# Patient Record
Sex: Female | Born: 1950
Health system: Southern US, Community
[De-identification: ages and names within clinical notes are randomized; demographics above are authoritative.]

## PROBLEM LIST (undated history)

## (undated) DIAGNOSIS — T7840XA Allergy, unspecified, initial encounter: Secondary | ICD-10-CM

## (undated) DIAGNOSIS — F329 Major depressive disorder, single episode, unspecified: Secondary | ICD-10-CM

## (undated) DIAGNOSIS — F1020 Alcohol dependence, uncomplicated: Secondary | ICD-10-CM

## (undated) DIAGNOSIS — E039 Hypothyroidism, unspecified: Secondary | ICD-10-CM

## (undated) DIAGNOSIS — F32A Depression, unspecified: Secondary | ICD-10-CM

## (undated) DIAGNOSIS — I10 Essential (primary) hypertension: Secondary | ICD-10-CM

## (undated) DIAGNOSIS — E785 Hyperlipidemia, unspecified: Secondary | ICD-10-CM

## (undated) HISTORY — PX: MYOMECTOMY: SHX85

## (undated) HISTORY — PX: OOPHORECTOMY: SHX86

## (undated) HISTORY — PX: POLYPECTOMY: SHX149

## (undated) HISTORY — DX: Allergy, unspecified, initial encounter: T78.40XA

## (undated) HISTORY — PX: COLONOSCOPY: SHX174

## (undated) HISTORY — PX: OTHER SURGICAL HISTORY: SHX169

## (undated) HISTORY — PX: MASTECTOMY: SHX3

---

## 1997-09-22 ENCOUNTER — Emergency Department (HOSPITAL_COMMUNITY): Admission: EM | Admit: 1997-09-22 | Discharge: 1997-09-23 | Payer: Self-pay | Admitting: Emergency Medicine

## 1999-05-01 ENCOUNTER — Encounter: Payer: Self-pay | Admitting: Endocrinology

## 1999-05-01 ENCOUNTER — Ambulatory Visit (HOSPITAL_COMMUNITY): Admission: RE | Admit: 1999-05-01 | Discharge: 1999-05-01 | Payer: Self-pay | Admitting: Endocrinology

## 1999-05-04 ENCOUNTER — Encounter: Payer: Self-pay | Admitting: Endocrinology

## 1999-05-04 ENCOUNTER — Ambulatory Visit (HOSPITAL_COMMUNITY): Admission: RE | Admit: 1999-05-04 | Discharge: 1999-05-04 | Payer: Self-pay | Admitting: Endocrinology

## 1999-05-12 ENCOUNTER — Encounter (INDEPENDENT_AMBULATORY_CARE_PROVIDER_SITE_OTHER): Payer: Self-pay

## 1999-05-12 ENCOUNTER — Other Ambulatory Visit: Admission: RE | Admit: 1999-05-12 | Discharge: 1999-05-12 | Payer: Self-pay | Admitting: Obstetrics & Gynecology

## 2001-07-09 ENCOUNTER — Encounter: Payer: Self-pay | Admitting: Emergency Medicine

## 2001-07-09 ENCOUNTER — Emergency Department (HOSPITAL_COMMUNITY): Admission: EM | Admit: 2001-07-09 | Discharge: 2001-07-09 | Payer: Self-pay

## 2002-07-21 ENCOUNTER — Other Ambulatory Visit: Admission: RE | Admit: 2002-07-21 | Discharge: 2002-07-21 | Payer: Self-pay | Admitting: Obstetrics & Gynecology

## 2004-05-01 ENCOUNTER — Other Ambulatory Visit: Admission: RE | Admit: 2004-05-01 | Discharge: 2004-05-01 | Payer: Self-pay | Admitting: Obstetrics & Gynecology

## 2005-01-15 ENCOUNTER — Ambulatory Visit: Payer: Self-pay | Admitting: Internal Medicine

## 2006-01-01 HISTORY — PX: BREAST EXCISIONAL BIOPSY: SUR124

## 2006-10-17 ENCOUNTER — Encounter: Admission: RE | Admit: 2006-10-17 | Discharge: 2006-10-17 | Payer: Self-pay | Admitting: Obstetrics & Gynecology

## 2006-11-29 ENCOUNTER — Encounter (INDEPENDENT_AMBULATORY_CARE_PROVIDER_SITE_OTHER): Payer: Self-pay | Admitting: General Surgery

## 2006-11-29 ENCOUNTER — Ambulatory Visit (HOSPITAL_COMMUNITY): Admission: RE | Admit: 2006-11-29 | Discharge: 2006-11-29 | Payer: Self-pay | Admitting: General Surgery

## 2009-06-28 ENCOUNTER — Encounter (INDEPENDENT_AMBULATORY_CARE_PROVIDER_SITE_OTHER): Payer: Self-pay | Admitting: *Deleted

## 2009-07-11 ENCOUNTER — Encounter (INDEPENDENT_AMBULATORY_CARE_PROVIDER_SITE_OTHER): Payer: Self-pay | Admitting: *Deleted

## 2009-07-21 ENCOUNTER — Ambulatory Visit: Payer: Self-pay | Admitting: Internal Medicine

## 2009-07-28 ENCOUNTER — Ambulatory Visit: Payer: Self-pay | Admitting: Internal Medicine

## 2009-07-29 ENCOUNTER — Encounter: Payer: Self-pay | Admitting: Internal Medicine

## 2009-10-07 ENCOUNTER — Encounter: Admission: RE | Admit: 2009-10-07 | Discharge: 2009-10-07 | Payer: Self-pay | Admitting: Obstetrics & Gynecology

## 2010-01-21 ENCOUNTER — Encounter: Payer: Self-pay | Admitting: Obstetrics & Gynecology

## 2010-01-31 NOTE — Procedures (Signed)
Summary: Colonoscopy  Patient: [name] Note: All result statuses are Final unless otherwise noted.  Tests: (1) Colonoscopy (COL)   COL Colonoscopy           DONE     [facility]     [address]     [place], [place]  [address]           COLONOSCOPY PROCEDURE REPORT           PATIENT:  Melissa Wolfe, Melissa Wolfe  MR#:  [id number]     BIRTHDATE:  1950-10-11, 59 yrs. old  GENDER:  female     ENDOSCOPIST:  Wilhemina Bonito. Eda Keys, MD     REF. BY:  Creola Corn, M.D.     PROCEDURE DATE:  07/28/2009     PROCEDURE:  Colonoscopy with snare polypectomy x 2     ASA CLASS:  Class II     INDICATIONS:  Routine Risk Screening     MEDICATIONS:   Fentanyl 100 mcg IV, Versed 9 mg IV           DESCRIPTION OF PROCEDURE:   After the risks benefits and     alternatives of the procedure were thoroughly explained, informed     consent was obtained.  Digital rectal exam was performed and     revealed no abnormalities.   The LB CF-H180AL [id number] endoscope     was introduced through the anus and advanced to the cecum, which     was identified by both the appendix and ileocecal valve, without     limitations.Time to cecum = 5:23 min.The quality of the prep was     excellent, using MoviPrep.  The instrument was then slowly     withdrawn (time = 14:11 min) as the colon was fully examined.     <<PROCEDUREIMAGES>>           FINDINGS:  Two 2mm polyps were found in the ascending colon.     Polyps were snared without cautery. Retrieval was successful in     the nonadenomatous appearring polyp. The adenomatous appearring     polyp was suctioned but not retrieved.  This was otherwise a     normal examination of the colon.   Retroflexed views in the rectum     revealed no abnormalities.    The scope was then withdrawn from     the patient and the procedure completed.           COMPLICATIONS:  None     ENDOSCOPIC IMPRESSION:     1) Two tiny polyps in the ascending colon -removed     2) Otherwise normal  examination           RECOMMENDATIONS:     1) Follow up colonoscopy in 5 years           ______________________________     Wilhemina Bonito. Eda Keys, MD           CC:  Creola Corn, MD; The Patient           n.     eSIGNED:   Wilhemina Bonito. Eda Keys at 07/28/2009 09:17 AM           Clide [place], [id number]  Note: An exclamation mark (!) indicates a result that was not dispersed into the flowsheet. Document Creation Date: 07/28/2009 9:19 AM _______________________________________________________________________  (1) Order result status: Final Collection or observation date-time: 07/28/2009 09:10 Requested date-time:  Receipt date-time:  Reported date-time:  Referring  Physician:   Ordering Physician: Fransico Setters [phone]) Specimen Source:  Source: [name] Order Number: [id number] Lab site:   Appended Document: Colonoscopy     Procedures Next Due Date:    Colonoscopy: 07/2014

## 2010-01-31 NOTE — Letter (Signed)
Summary: [facility] Instructions  [facility]  [address] [place], [place] [address]   Phone: [phone]  Fax: [phone]       Melissa Wolfe    07/02/1950    MRN: [id number]        Procedure Day /Date:  Thursday 07/28/2009     Arrival Time: 8:00 am      Procedure Time: 9:00 am     Location of Procedure:                    _ x_  [facility] (4th Floor)                        PREPARATION FOR COLONOSCOPY WITH MOVIPREP   Starting 5 days prior to your procedure Saturday 7/23 do not eat nuts, seeds, popcorn, corn, beans, peas,  salads, or any raw vegetables.  Do not take any fiber supplements (e.g. Metamucil, Citrucel, and Benefiber).  THE DAY BEFORE YOUR PROCEDURE         DATE: Wednesday 7/27  1.  Drink clear liquids the entire day-NO SOLID FOOD  2.  Do not drink anything colored red or purple.  Avoid juices with pulp.  No orange juice.  3.  Drink at least 64 oz. (8 glasses) of fluid/clear liquids during the day to prevent dehydration and help the prep work efficiently.  CLEAR LIQUIDS INCLUDE: Water Jello Ice Popsicles Tea (sugar ok, no milk/cream) Powdered fruit flavored drinks Coffee (sugar ok, no milk/cream) Gatorade Juice: apple, white grape, white cranberry  Lemonade Clear bullion, consomm, broth Carbonated beverages (any kind) Strained chicken noodle soup Hard Candy                             4.  In the morning, mix first dose of MoviPrep solution:    Empty 1 Pouch A and 1 Pouch B into the disposable container    Add lukewarm drinking water to the top line of the container. Mix to dissolve    Refrigerate (mixed solution should be used within 24 hrs)  5.  Begin drinking the prep at 5:00 p.m. The MoviPrep container is divided by 4 marks.   Every 15 minutes drink the solution down to the next mark (approximately 8 oz) until the full liter is complete.   6.  Follow completed prep with 16 oz of clear liquid of your choice  (Nothing red or purple).  Continue to drink clear liquids until bedtime.  7.  Before going to bed, mix second dose of MoviPrep solution:    Empty 1 Pouch A and 1 Pouch B into the disposable container    Add lukewarm drinking water to the top line of the container. Mix to dissolve    Refrigerate  THE DAY OF YOUR PROCEDURE      DATE: Thursday 7/28  Beginning at 4:00 a.m. (5 hours before procedure):         1. Every 15 minutes, drink the solution down to the next mark (approx 8 oz) until the full liter is complete.  2. Follow completed prep with 16 oz. of clear liquid of your choice.    3. You may drink clear liquids until 7:00 am (2 HOURS BEFORE PROCEDURE).   MEDICATION INSTRUCTIONS  Unless otherwise instructed, you should take regular prescription medications with a small sip of water   as early as possible the morning of  your procedure.         OTHER INSTRUCTIONS  You will need a responsible adult at least 62 years of age to accompany you and drive you home.   This person must remain in the waiting room during your procedure.  Wear loose fitting clothing that is easily removed.  Leave jewelry and other valuables at home.  However, you may wish to bring a book to read or  an iPod/MP3 player to listen to music as you wait for your procedure to start.  Remove all body piercing jewelry and leave at home.  Total time from sign-in until discharge is approximately 2-3 hours.  You should go home directly after your procedure and rest.  You can resume normal activities the  day after your procedure.  The day of your procedure you should not:   Drive   Make legal decisions   Operate machinery   Drink alcohol   Return to work  You will receive specific instructions about eating, activities and medications before you leave.    The above instructions have been reviewed and explained to me by   Karl Bales RN  July 21, 2009 4:24 PM    I fully understand and  can verbalize these instructions _____________________________ Date _________

## 2010-01-31 NOTE — Letter (Signed)
Summary: Patient Notice- Polyp Results  [facility]  [address] [place], [place] [address]   Phone: [phone]  Fax: [phone]        July 29, 2009 MRN: [id number]    Melissa Wolfe [address] [place], [place]  [address]    Dear [name],  I am pleased to inform you that the colon polyp(s) removed during your recent colonoscopy was (were) found to be benign (no cancer detected) upon pathologic examination.  I recommend you have a repeat colonoscopy examination in 5 years to look for recurrent polyps, as having colon polyps increases your risk for having recurrent polyps or even colon cancer in the future.  Should you develop new or worsening symptoms of abdominal pain, bowel habit changes or bleeding from the rectum or bowels, please schedule an evaluation with either your primary care physician or with me.  Additional information/recommendations:  __ No further action with gastroenterology is needed at this time. Please      follow-up with your primary care physician for your other healthcare      needs.   Please call us if you are having persistent problems or have questions about your condition that have not been fully answered at this time.  Sincerely,  Hilarie Fredrickson MD  This letter has been electronically signed by your physician.  Appended Document: Patient Notice- Polyp Results Letter mailed 8.1.2011

## 2010-01-31 NOTE — Miscellaneous (Signed)
Summary: [facility] previsit  Clinical Lists Changes  Medications: Added new medication of MOVIPREP 100 GM  SOLR (PEG-KCL-NACL-NASULF-NA ASC-C) As per prep instructions. - Signed Rx of MOVIPREP 100 GM  SOLR (PEG-KCL-NACL-NASULF-NA ASC-C) As per prep instructions.;  #1 x 0;  Signed;  Entered by: Karl Bales RN;  Authorized by: Hilarie Fredrickson MD;  Method used: Electronically to [facility]  [address] [phone]*, [address], [place], [place], [place]  [address], Ph: [phone] or [phone], Fax: [phone] Allergies: Added new allergy or adverse reaction of CODEINE Added new allergy or adverse reaction of KEFLEX Observations: Added new observation of NKA: F (07/21/2009 15:58)    Prescriptions: MOVIPREP 100 GM  SOLR (PEG-KCL-NACL-NASULF-NA ASC-C) As per prep instructions.  #1 x 0   Entered by:   Karl Bales RN   Authorized by:   Hilarie Fredrickson MD   Signed by:   Karl Bales RN on 07/21/2009   Method used:   Electronically to        [facility]  [address] [phone]* (retail)       [address]       [place], [place]  [address]       Ph: [phone] or [phone]       Fax: [phone]   RxID:   [id number]

## 2010-01-31 NOTE — Letter (Signed)
Summary: Previsit letter  [facility]  [address] [place], [place] [address]   Phone: [phone]  Fax: [phone]       06/28/2009 MRN: [id number]  [facility] [address] [place], [place]  [address]  Dear Melissa Wolfe,  Welcome to the Gastroenterology Division at [facility].    You are scheduled to see a nurse for your pre-procedure visit on 07-14-09 at 8:30A.M. on the 3rd floor at [facility], [address]  We ask that you try to arrive at our office 15 minutes prior to your appointment time to allow for check-in.  Your nurse visit will consist of discussing your medical and surgical history, your immediate family medical history, and your medications.    Please bring a complete list of all your medications or, if you prefer, bring the medication bottles and we will list them.  We will need to be aware of both prescribed and over the counter drugs.  We will need to know exact dosage information as well.  If you are on blood thinners (Coumadin, Plavix, Aggrenox, Ticlid, etc.) please call our office today/prior to your appointment, as we need to consult with your physician about holding your medication.   Please be prepared to read and sign documents such as consent forms, a financial agreement, and acknowledgement forms.  If necessary, and with your consent, a friend or relative is welcome to sit-in on the nurse visit with you.  Please bring your insurance card so that we may make a copy of it.  If your insurance requires a referral to see a specialist, please bring your referral form from your primary care physician.  No co-pay is required for this nurse visit.     If you cannot keep your appointment, please call [phone] to cancel or reschedule prior to your appointment date.  This allows [name] the opportunity to schedule an appointment for another patient in need of care.    Thank you for choosing [facility] for  your medical needs.  We appreciate the opportunity to care for you.  Please visit [name] at our website  to learn more about our practice.                     Sincerely.                                                                                                                   The Gastroenterology Division

## 2010-05-16 NOTE — Op Note (Signed)
NAME:  Melissa Wolfe, Melissa Wolfe           ACCOUNT NO.:  [id number]   MEDICAL RECORD NO.:  [id number]          PATIENT TYPE:  AMB   LOCATION:  DAY                          FACILITY:  [facility]   PHYSICIAN:  Lennie Muckle, MD      DATE OF BIRTH:  [birth year]   DATE OF PROCEDURE:  11/29/2006  DATE OF DISCHARGE:                               OPERATIVE REPORT   PREOPERATIVE DIAGNOSIS:  Left breast cyst.   POSTOPERATIVE DIAGNOSIS:  Left breast cyst.   PROCEDURE:  Excision of left breast cyst.   ANESTHESIA:  General endotracheal anesthesia.   SURGEON:  Bertram Savin, M.D.   FINDINGS:  A large approximately 10 cm cyst which extended deep to the  pectoralis muscle.   SPECIMENS:  Cyst as well as cystic fluid was sent to microbiology and  pathology.  The specimen was marked for pathology   ESTIMATED BLOOD LOSS:  Minimal.   INDICATIONS FOR PROCEDURE:  Melissa Wolfe is a 60 year old female who has  had multiple aspirations of a cyst in her left breast that was atypia on  the cystic fluid.  Due to the multiple recurrences it was decided the  best procedure would be excision of this cystic tissue.  Informed  consent was obtained prior to the procedure.   PROCEDURE IN DETAIL:  Melissa Wolfe was identified in the preoperative  holding suite.  She was given IV antibiotics prior to being taken to the  operating room. Once in the operating room she was placed in the supine  position.  After administration of general endotracheal anesthesia,  her  left breast and chest wall were prepped and draped in the usual sterile  fashion.  Using 0.25% Marcaine the skin was anesthetized and a #15 blade  was used for incision.  This was placed directly over the cystic lesion  which was approximately 8 o'clock in position.  The subcutaneous tissue  was divided with electrocautery.  The cyst was easily identified and  palpable.  The surrounding tissues were dissected away with the  electrocautery.  The cyst was deep down  into the tissues to the  pectoralis muscle, and in an attempt to fully excise the cyst did enter  the cystic cavity and fluid was released.  This was sent for  microbiology and pathology.  I continued to perform the excision of this  cystic capsule.  Marked the lateral border with a long silk suture and  the deep tissue was marked with a double short suture.  The specimen was  then removed from the operative field.  The cavity was irrigated.  Bleeding was controlled with electrocautery.  The dermal layer  was closed with 3-0 Vicryl.  The skin was closed with 4-0 Monocryl.  The  cavity was then filled with 0.25% Marcaine with epinephrine as well as  further anesthetizing the skin.  Two Steri-Strips were placed, then  gauze and an ACE wrap around the chest wall. The patient was then  extubated and transported to the postanesthesia care unit.      Lennie Muckle, MD  Electronically Signed     ALA/MEDQ  D:  11/29/2006  T:  11/29/2006  Job:  [id number]   cc:   Gwen Pounds, MD  Fax: [phone]   Drema Halon  [facility]  [facility]

## 2010-10-10 LAB — BASIC METABOLIC PANEL
BUN: 9
Chloride: 105
GFR calc non Af Amer: >60
Glucose, Bld: 98
Potassium: 3.6

## 2010-10-10 LAB — CULTURE, ROUTINE-ABSCESS: Gram Stain: NONE SEEN

## 2010-10-10 LAB — ANAEROBIC CULTURE

## 2010-10-10 LAB — HEMOGLOBIN AND HEMATOCRIT, BLOOD: Hemoglobin: 14

## 2014-03-01 ENCOUNTER — Emergency Department (HOSPITAL_BASED_OUTPATIENT_CLINIC_OR_DEPARTMENT_OTHER)
Admission: EM | Admit: 2014-03-01 | Discharge: 2014-03-01 | Disposition: A | Discharge status: Home / Self Care | Payer: Medicare Other | Attending: Emergency Medicine | Admitting: Emergency Medicine

## 2014-03-01 ENCOUNTER — Emergency Department (HOSPITAL_BASED_OUTPATIENT_CLINIC_OR_DEPARTMENT_OTHER): Payer: Medicare Other

## 2014-03-01 ENCOUNTER — Encounter (HOSPITAL_BASED_OUTPATIENT_CLINIC_OR_DEPARTMENT_OTHER): Payer: Self-pay | Admitting: Emergency Medicine

## 2014-03-01 DIAGNOSIS — E785 Hyperlipidemia, unspecified: Secondary | ICD-10-CM | POA: Insufficient documentation

## 2014-03-01 DIAGNOSIS — R51 Headache: Secondary | ICD-10-CM | POA: Diagnosis not present

## 2014-03-01 DIAGNOSIS — Z8701 Personal history of pneumonia (recurrent): Secondary | ICD-10-CM | POA: Insufficient documentation

## 2014-03-01 DIAGNOSIS — R05 Cough: Secondary | ICD-10-CM

## 2014-03-01 DIAGNOSIS — Z87891 Personal history of nicotine dependence: Secondary | ICD-10-CM | POA: Diagnosis not present

## 2014-03-01 DIAGNOSIS — I10 Essential (primary) hypertension: Secondary | ICD-10-CM | POA: Insufficient documentation

## 2014-03-01 DIAGNOSIS — Z79899 Other long term (current) drug therapy: Secondary | ICD-10-CM | POA: Diagnosis not present

## 2014-03-01 DIAGNOSIS — E039 Hypothyroidism, unspecified: Secondary | ICD-10-CM | POA: Diagnosis not present

## 2014-03-01 DIAGNOSIS — J069 Acute upper respiratory infection, unspecified: Secondary | ICD-10-CM

## 2014-03-01 DIAGNOSIS — F329 Major depressive disorder, single episode, unspecified: Secondary | ICD-10-CM | POA: Insufficient documentation

## 2014-03-01 DIAGNOSIS — R059 Cough, unspecified: Secondary | ICD-10-CM

## 2014-03-01 HISTORY — DX: Essential (primary) hypertension: I10

## 2014-03-01 HISTORY — DX: Hyperlipidemia, unspecified: E78.5

## 2014-03-01 HISTORY — DX: Hypothyroidism, unspecified: E03.9

## 2014-03-01 HISTORY — DX: Depression, unspecified: F32.A

## 2014-03-01 HISTORY — DX: Major depressive disorder, single episode, unspecified: F32.9

## 2014-03-01 MED ORDER — PREDNISONE 50 MG PO TABS
60.0000 mg | ORAL_TABLET | Freq: Once | ORAL | Status: AC
  Administered 2014-03-01: 60 mg via ORAL
  Filled 2014-03-01 (×2): qty 1

## 2014-03-01 MED ORDER — IPRATROPIUM-ALBUTEROL 0.5-2.5 (3) MG/3ML IN SOLN
3.0000 mL | RESPIRATORY_TRACT | Status: AC
  Administered 2014-03-01: 3 mL via RESPIRATORY_TRACT
  Filled 2014-03-01: qty 3

## 2014-03-01 MED ORDER — ALBUTEROL SULFATE HFA 108 (90 BASE) MCG/ACT IN AERS: 1.0000 | INHALATION_SPRAY | Freq: Four times a day (QID) | RESPIRATORY_TRACT | Status: DC | PRN

## 2014-03-01 MED ORDER — PREDNISONE 20 MG PO TABS: ORAL_TABLET | ORAL | Status: DC

## 2014-03-01 NOTE — Discharge Instructions (Signed)
Cough, Adult   A cough is a reflex. It helps you clear your throat and airways. A cough can help heal your body. A cough can last 2 or 3 weeks (acute) or may last more than 8 weeks (chronic). Some common causes of a cough can include an infection, allergy, or a cold.  HOME CARE  · Only take medicine as told by your doctor.  · If given, take your medicines (antibiotics) as told. Finish them even if you start to feel better.  · Use a cold steam vaporizer or humidifier in your home. This can help loosen thick spit (secretions).  · Sleep so you are almost sitting up (semi-upright). Use pillows to do this. This helps reduce coughing.  · Rest as needed.  · Stop smoking if you smoke.  GET HELP RIGHT AWAY IF:  · You have yellowish-white fluid (pus) in your thick spit.  · Your cough gets worse.  · Your medicine does not reduce coughing, and you are losing sleep.  · You cough up blood.  · You have trouble breathing.  · Your pain gets worse and medicine does not help.  · You have a fever.  MAKE SURE YOU:   · Understand these instructions.  · Will watch your condition.  · Will get help right away if you are not doing well or get worse.  Document Released: 08/31/2010 Document Revised: 05/04/2013 Document Reviewed: 08/31/2010  [employer]® Patient Information ©2015 [employer]. This information is not intended to replace advice given to you by your health care provider. Make sure you discuss any questions you have with your health care provider.

## 2014-03-01 NOTE — ED Notes (Signed)
Pt having productive cough x 3 weeks with post nasal drip.  Pt having white sputum.  Pt now having some sob with exertion.  Pt unable to complete sentence without taking breath.

## 2014-03-01 NOTE — ED Provider Notes (Signed)
CSN: [id number]     Arrival date & time 03/01/14  1628 History  This chart was scribed for Pamella Pert, MD by Edison Simon, ED Scribe. This patient was seen in room MH06/MH06 and the patient's care was started at 5:05 PM.    Chief Complaint  Patient presents with  . Cough   Patient is a 64 y.o. female presenting with cough. The history is provided by the patient. No language interpreter was used.  Cough Cough characteristics:  Productive Sputum characteristics:  Clear and white Severity:  Moderate Onset quality:  Gradual Duration:  3 weeks Timing:  Constant Progression:  Worsening Chronicity:  New Smoker: no (former smoker)   Context: upper respiratory infection   Relieved by: flonase. Worsened by:  Nothing tried Ineffective treatments:  None tried Associated symptoms: headaches, shortness of breath and wheezing   Associated symptoms: no chest pain, no eye discharge and no rash   Shortness of breath:    Duration:  2 days Risk factors: recent infection     HPI Comments: Melissa Wolfe is a 64 y.o. female who presents to the Emergency Department complaining of cough with onset 3 weeks ago. She reports associated postnasal drip, congestion, headache, and wheezing. She states she began feeling SOB yesterday and began wheezing today. She notes she slept all day yesterday. She states she had wheezing years ago with pneumonia. She states cough produces white/clear phlegm. She reports chest discomfort with coughing but denies pain. She states Flonase improves her symptoms. She stopped smoking 4 years ago but had smoked since she was 57 previously. She reports history of HTN, HLD, and hypothyroid. She denies vomiting, diarrhea, or chest pain.  Past Medical History  Diagnosis Date  . Hypothyroid   . Hypertension   . Hyperlipemia   . Depression    History reviewed. No pertinent past surgical history. No family history on file. History  Substance Use Topics  . Smoking status:  Former Smoker    Quit date: 02/28/2010  . Smokeless tobacco: Not on file  . Alcohol Use: Not on file   OB History    No data available     Review of Systems  Constitutional: Negative for appetite change and fatigue.  HENT: Positive for congestion and postnasal drip. Negative for ear discharge and sinus pressure.   Eyes: Negative for discharge.  Respiratory: Positive for cough, shortness of breath and wheezing.   Cardiovascular: Negative for chest pain.  Gastrointestinal: Negative for vomiting, abdominal pain and diarrhea.  Genitourinary: Negative for frequency and hematuria.  Musculoskeletal: Negative for back pain.  Skin: Negative for rash.  Neurological: Positive for headaches. Negative for seizures.  Psychiatric/Behavioral: Negative for hallucinations.  All other systems reviewed and are negative.     Allergies  Codeine and Cephalexin  Home Medications   Prior to Admission medications   Medication Sig Start Date End Date Taking? Authorizing Provider  atorvastatin (LIPITOR) 20 MG tablet Take 20 mg by mouth daily.   Yes Historical Provider, MD  levothyroxine (SYNTHROID, LEVOTHROID) 137 MCG tablet Take 137 mcg by mouth daily before breakfast.   Yes Historical Provider, MD  UNKNOWN TO PATIENT BP meds   Yes Historical Provider, MD  venlafaxine XR (EFFEXOR-XR) 37.5 MG 24 hr capsule Take 37.5 mg by mouth daily with breakfast.   Yes Historical Provider, MD   BP 149/71 mmHg  Pulse 80  Temp(Src) 98.1 F (36.7 C) (Oral)  Resp 18  Ht 5' (1.524 m)  Wt 179 lb 4 oz (81.307 kg)  BMI 35.01 kg/m2  SpO2 99% Physical Exam  Constitutional: She appears well-developed and well-nourished. No distress.  Dry hacking cough on exam  HENT:  Head: Normocephalic and atraumatic.  Mouth/Throat: Oropharynx is clear and moist. No oropharyngeal exudate.  Clear TMs bilaterally  Eyes: Conjunctivae and EOM are normal. Pupils are equal, round, and reactive to light. Right eye exhibits no discharge.  Left eye exhibits no discharge. No scleral icterus.  Neck: Normal range of motion. Neck supple. No JVD present. No thyromegaly present.  Cardiovascular: Normal rate, regular rhythm, normal heart sounds and intact distal pulses.  Exam reveals no gallop and no friction rub.   No murmur heard. Pulmonary/Chest: Effort normal and breath sounds normal. No respiratory distress. She has no wheezes. She has no rales.  Abdominal: Soft. Bowel sounds are normal. She exhibits no distension and no mass. There is no tenderness.  Musculoskeletal: Normal range of motion. She exhibits no edema or tenderness.  Symmetric lower extremities without focal tenderness  Lymphadenopathy:    She has no cervical adenopathy.  Neurological: She is alert. No cranial nerve deficit. Coordination normal.  Skin: Skin is warm and dry. No rash noted. No erythema.  Psychiatric: She has a normal mood and affect. Her behavior is normal.  Nursing note and vitals reviewed.   ED Course  Procedures (including critical care time)  DIAGNOSTIC STUDIES: Oxygen Saturation is 99% on room air, normal by my interpretation.    COORDINATION OF CARE: 5:14 PM Discussed with patient that x-ray does not reveal evidence of pneumonia. Discussed treatment plan with patient at beside, including breathing treatment, EKG, nasal steroid, and antihistamine. The patient agrees with the plan and has no further questions at this time.   Labs Review Labs Reviewed - No data to display  Imaging Review Dg Chest 2 View  03/01/2014   CLINICAL DATA:  Cough for 3 weeks with worsening today  EXAM: CHEST  2 VIEW  COMPARISON:  11/27/2006  FINDINGS: Cardiomediastinal silhouette is unremarkable. No acute infiltrate or pulmonary edema. Mild degenerative changes mid thoracic spine.  IMPRESSION: No active cardiopulmonary disease.   Electronically Signed   By: Lahoma Crocker M.D.   On: 03/01/2014 17:05     EKG Interpretation   Date/Time:  Monday March 01 2014  17:40:29 EST Ventricular Rate:  69 PR Interval:  142 QRS Duration: 84 QT Interval:  414 QTC Calculation: 443 R Axis:     Text Interpretation:  Normal sinus rhythm Nonspecific T wave abnormality  No significant change since last tracing Confirmed by Danali Marinos  MD,  Lailah Marcelli ([id number]) on 03/01/2014 5:46:42 PM      MDM   Final diagnoses:  Cough  URI (upper respiratory infection)    6:31 PM 64 y.o. female  Here with a cough productive of white sputum , rhinorrhea for the last 3 weeks. She notes some mild shortness of breath and wheezing over the last 2 days. No significant wheezing on my exam. I did give her a breathing treatment which she states helped significantly.  Screening EKG is noncontributory. Vital signs unremarkable here. Chest wall pain only w/ coughing. Do not think this is ACS or PE. Likely related to viral URI. Given significant treatment with a breathing treatment will send her home on prednisone and albuterol. She does have a smoking history although she is not a current smoker.  6:32 PM:  I have discussed the diagnosis/risks/treatment options with the patient and believe the pt to be eligible for discharge home to follow-up with  her pcp in 2 days if no better. We also discussed returning to the ED immediately if new or worsening sx occur. We discussed the sx which are most concerning (e.g., cp, sob, fever) that necessitate immediate return. Medications administered to the patient during their visit and any new prescriptions provided to the patient are listed below.  Medications given during this visit Medications  predniSONE (DELTASONE) tablet 60 mg (not administered)  ipratropium-albuterol (DUONEB) 0.5-2.5 (3) MG/3ML nebulizer solution 3 mL (3 mLs Nebulization Given 03/01/14 1742)    New Prescriptions   ALBUTEROL (PROVENTIL HFA;VENTOLIN HFA) 108 (90 BASE) MCG/ACT INHALER    Inhale 1-2 puffs into the lungs every 6 (six) hours as needed for wheezing or shortness of breath.    PREDNISONE (DELTASONE) 20 MG TABLET    Take 2 tablets by mouth on day 1 and 2. Take 1 tablet by mouth on day 3.      I personally performed the services described in this documentation, which was scribed in my presence. The recorded information has been reviewed and is accurate.    Pamella Pert, MD 03/01/14 [phone]

## 2014-03-01 NOTE — ED Notes (Signed)
Pt states "I feel so much better after the breathing treatment!"

## 2014-04-02 DIAGNOSIS — E039 Hypothyroidism, unspecified: Secondary | ICD-10-CM | POA: Diagnosis not present

## 2014-04-02 DIAGNOSIS — Z1212 Encounter for screening for malignant neoplasm of rectum: Secondary | ICD-10-CM | POA: Diagnosis not present

## 2014-04-02 DIAGNOSIS — Z008 Encounter for other general examination: Secondary | ICD-10-CM | POA: Diagnosis not present

## 2014-04-02 DIAGNOSIS — I10 Essential (primary) hypertension: Secondary | ICD-10-CM | POA: Diagnosis not present

## 2014-04-02 DIAGNOSIS — R739 Hyperglycemia, unspecified: Secondary | ICD-10-CM | POA: Diagnosis not present

## 2014-04-02 DIAGNOSIS — N390 Urinary tract infection, site not specified: Secondary | ICD-10-CM | POA: Diagnosis not present

## 2014-04-15 DIAGNOSIS — E785 Hyperlipidemia, unspecified: Secondary | ICD-10-CM | POA: Diagnosis not present

## 2014-04-15 DIAGNOSIS — F3341 Major depressive disorder, recurrent, in partial remission: Secondary | ICD-10-CM | POA: Diagnosis not present

## 2014-04-15 DIAGNOSIS — F419 Anxiety disorder, unspecified: Secondary | ICD-10-CM | POA: Diagnosis not present

## 2014-04-15 DIAGNOSIS — Z0000 Encounter for general adult medical examination without abnormal findings: Secondary | ICD-10-CM | POA: Diagnosis not present

## 2014-04-15 DIAGNOSIS — M5136 Other intervertebral disc degeneration, lumbar region: Secondary | ICD-10-CM | POA: Diagnosis not present

## 2014-04-15 DIAGNOSIS — R739 Hyperglycemia, unspecified: Secondary | ICD-10-CM | POA: Diagnosis not present

## 2014-04-15 DIAGNOSIS — K649 Unspecified hemorrhoids: Secondary | ICD-10-CM | POA: Diagnosis not present

## 2014-04-15 DIAGNOSIS — R195 Other fecal abnormalities: Secondary | ICD-10-CM | POA: Diagnosis not present

## 2014-04-16 ENCOUNTER — Other Ambulatory Visit: Payer: Self-pay | Admitting: Internal Medicine

## 2014-04-16 DIAGNOSIS — Z1231 Encounter for screening mammogram for malignant neoplasm of breast: Secondary | ICD-10-CM

## 2014-04-21 ENCOUNTER — Ambulatory Visit: Payer: Medicare Other

## 2014-05-04 DIAGNOSIS — I10 Essential (primary) hypertension: Secondary | ICD-10-CM | POA: Diagnosis not present

## 2014-05-04 DIAGNOSIS — F1014 Alcohol abuse with alcohol-induced mood disorder: Secondary | ICD-10-CM | POA: Diagnosis not present

## 2014-05-04 DIAGNOSIS — F329 Major depressive disorder, single episode, unspecified: Secondary | ICD-10-CM | POA: Diagnosis not present

## 2014-05-04 DIAGNOSIS — E039 Hypothyroidism, unspecified: Secondary | ICD-10-CM | POA: Diagnosis not present

## 2014-05-04 DIAGNOSIS — Z79899 Other long term (current) drug therapy: Secondary | ICD-10-CM | POA: Diagnosis not present

## 2014-05-11 ENCOUNTER — Ambulatory Visit
Admission: RE | Admit: 2014-05-11 | Discharge: 2014-05-11 | Disposition: A | Discharge status: Home / Self Care | Payer: Commercial Managed Care - HMO | Source: Ambulatory Visit | Attending: Internal Medicine | Admitting: Internal Medicine

## 2014-05-11 DIAGNOSIS — Z1231 Encounter for screening mammogram for malignant neoplasm of breast: Secondary | ICD-10-CM

## 2014-06-03 ENCOUNTER — Encounter: Payer: Self-pay | Admitting: Internal Medicine

## 2014-06-22 ENCOUNTER — Encounter (HOSPITAL_COMMUNITY): Payer: Self-pay | Admitting: Emergency Medicine

## 2014-06-22 ENCOUNTER — Inpatient Hospital Stay (HOSPITAL_COMMUNITY)
Admission: EM | Admit: 2014-06-22 | Discharge: 2014-06-28 | DRG: 897 | Disposition: A | Discharge status: Home / Self Care | Payer: Commercial Managed Care - HMO | Source: Intra-hospital | Attending: Psychiatry | Admitting: Psychiatry

## 2014-06-22 ENCOUNTER — Ambulatory Visit (HOSPITAL_COMMUNITY)
Admission: RE | Admit: 2014-06-22 | Discharge: 2014-06-22 | Disposition: A | Discharge status: Home / Self Care | Payer: Commercial Managed Care - HMO | Source: Home / Self Care | Attending: Psychiatry | Admitting: Psychiatry

## 2014-06-22 ENCOUNTER — Emergency Department (HOSPITAL_COMMUNITY)
Admission: EM | Admit: 2014-06-22 | Discharge: 2014-06-22 | Disposition: A | Discharge status: Home / Self Care | Payer: Commercial Managed Care - HMO | Attending: Emergency Medicine | Admitting: Emergency Medicine

## 2014-06-22 DIAGNOSIS — F1020 Alcohol dependence, uncomplicated: Secondary | ICD-10-CM | POA: Diagnosis not present

## 2014-06-22 DIAGNOSIS — E039 Hypothyroidism, unspecified: Secondary | ICD-10-CM | POA: Insufficient documentation

## 2014-06-22 DIAGNOSIS — I10 Essential (primary) hypertension: Secondary | ICD-10-CM | POA: Diagnosis not present

## 2014-06-22 DIAGNOSIS — F331 Major depressive disorder, recurrent, moderate: Secondary | ICD-10-CM | POA: Diagnosis present

## 2014-06-22 DIAGNOSIS — F17210 Nicotine dependence, cigarettes, uncomplicated: Secondary | ICD-10-CM | POA: Diagnosis present

## 2014-06-22 DIAGNOSIS — E785 Hyperlipidemia, unspecified: Secondary | ICD-10-CM | POA: Diagnosis not present

## 2014-06-22 DIAGNOSIS — F329 Major depressive disorder, single episode, unspecified: Secondary | ICD-10-CM | POA: Insufficient documentation

## 2014-06-22 DIAGNOSIS — Z79899 Other long term (current) drug therapy: Secondary | ICD-10-CM | POA: Insufficient documentation

## 2014-06-22 DIAGNOSIS — M79644 Pain in right finger(s): Secondary | ICD-10-CM | POA: Insufficient documentation

## 2014-06-22 DIAGNOSIS — F10230 Alcohol dependence with withdrawal, uncomplicated: Secondary | ICD-10-CM | POA: Diagnosis not present

## 2014-06-22 DIAGNOSIS — Y900 Blood alcohol level of less than 20 mg/100 ml: Secondary | ICD-10-CM | POA: Diagnosis not present

## 2014-06-22 DIAGNOSIS — F1019 Alcohol abuse with unspecified alcohol-induced disorder: Secondary | ICD-10-CM | POA: Diagnosis present

## 2014-06-22 DIAGNOSIS — Z811 Family history of alcohol abuse and dependence: Secondary | ICD-10-CM | POA: Diagnosis present

## 2014-06-22 DIAGNOSIS — G4700 Insomnia, unspecified: Secondary | ICD-10-CM | POA: Diagnosis not present

## 2014-06-22 DIAGNOSIS — Z008 Encounter for other general examination: Secondary | ICD-10-CM | POA: Diagnosis present

## 2014-06-22 DIAGNOSIS — Z720 Tobacco use: Secondary | ICD-10-CM | POA: Insufficient documentation

## 2014-06-22 DIAGNOSIS — S66411A Strain of intrinsic muscle, fascia and tendon of right thumb at wrist and hand level, initial encounter: Secondary | ICD-10-CM | POA: Diagnosis not present

## 2014-06-22 HISTORY — DX: Alcohol dependence, uncomplicated: F10.20

## 2014-06-22 LAB — COMPREHENSIVE METABOLIC PANEL
ALBUMIN: 3.9 g/dL (ref 3.5–5.0)
ALK PHOS: 115 U/L (ref 38–126)
ALT: 16 U/L (ref 14–54)
AST: 18 U/L (ref 15–41)
Anion gap: 10 (ref 5–15)
BILIRUBIN TOTAL: 0.5 mg/dL (ref 0.3–1.2)
BUN: 15 mg/dL (ref 6–20)
CHLORIDE: 105 mmol/L (ref 101–111)
CO2: 27 mmol/L (ref 22–32)
CREATININE: 0.81 mg/dL (ref 0.44–1.00)
Calcium: 9.4 mg/dL (ref 8.9–10.3)
GFR calc Af Amer: >60 mL/min (ref >60)
Glucose, Bld: 103 mg/dL — ABNORMAL HIGH (ref 65–99)
POTASSIUM: 3.6 mmol/L (ref 3.5–5.1)
Sodium: 142 mmol/L (ref 135–145)
Total Protein: 7.5 g/dL (ref 6.5–8.1)

## 2014-06-22 LAB — CBC
HCT: 40.9 % (ref 36.0–46.0)
HEMOGLOBIN: 13.6 g/dL (ref 12.0–15.0)
MCH: 31.3 pg (ref 26.0–34.0)
MCHC: 33.3 g/dL (ref 30.0–36.0)
MCV: 94 fL (ref 78.0–100.0)
Platelets: 272 10*3/uL (ref 150–400)
RBC: 4.35 MIL/uL (ref 3.87–5.11)
RDW: 12.8 % (ref 11.5–15.5)
WBC: 7 10*3/uL (ref 4.0–10.5)

## 2014-06-22 LAB — SALICYLATE LEVEL

## 2014-06-22 LAB — RAPID URINE DRUG SCREEN, HOSP PERFORMED
Amphetamines: NOT DETECTED
BARBITURATES: NOT DETECTED
BENZODIAZEPINES: NOT DETECTED
COCAINE: NOT DETECTED
OPIATES: NOT DETECTED
Tetrahydrocannabinol: NOT DETECTED

## 2014-06-22 LAB — ETHANOL: Alcohol, Ethyl (B): <5 mg/dL (ref <5)

## 2014-06-22 LAB — ACETAMINOPHEN LEVEL

## 2014-06-22 MED ORDER — LEVOTHYROXINE SODIUM 137 MCG PO TABS
137.0000 ug | ORAL_TABLET | Freq: Every day | ORAL | Status: DC
  Administered 2014-06-23 – 2014-06-28 (×6): 137 ug via ORAL
  Filled 2014-06-22 (×8): qty 1

## 2014-06-22 MED ORDER — CHLORDIAZEPOXIDE HCL 25 MG PO CAPS: 25.0000 mg | ORAL_CAPSULE | Freq: Four times a day (QID) | ORAL | Status: DC | PRN

## 2014-06-22 MED ORDER — VENLAFAXINE HCL ER 37.5 MG PO CP24
37.5000 mg | ORAL_CAPSULE | Freq: Every day | ORAL | Status: DC
  Administered 2014-06-23 – 2014-06-24 (×2): 37.5 mg via ORAL
  Filled 2014-06-22 (×3): qty 1

## 2014-06-22 MED ORDER — HYDROXYZINE HCL 25 MG PO TABS
25.0000 mg | ORAL_TABLET | Freq: Four times a day (QID) | ORAL | Status: AC | PRN
  Administered 2014-06-24: 25 mg via ORAL
  Filled 2014-06-22: qty 1

## 2014-06-22 MED ORDER — TRAZODONE HCL 50 MG PO TABS: 50.0000 mg | ORAL_TABLET | Freq: Every evening | ORAL | Status: DC | PRN

## 2014-06-22 MED ORDER — ONDANSETRON 4 MG PO TBDP: 4.0000 mg | ORAL_TABLET | Freq: Four times a day (QID) | ORAL | Status: AC | PRN

## 2014-06-22 MED ORDER — CHLORDIAZEPOXIDE HCL 25 MG PO CAPS: 25.0000 mg | ORAL_CAPSULE | ORAL | Status: DC

## 2014-06-22 MED ORDER — ATORVASTATIN CALCIUM 20 MG PO TABS
20.0000 mg | ORAL_TABLET | Freq: Every day | ORAL | Status: DC
  Administered 2014-06-23 – 2014-06-28 (×6): 20 mg via ORAL
  Filled 2014-06-22 (×8): qty 1

## 2014-06-22 MED ORDER — CHLORDIAZEPOXIDE HCL 25 MG PO CAPS: 25.0000 mg | ORAL_CAPSULE | Freq: Every day | ORAL | Status: DC

## 2014-06-22 MED ORDER — ACETAMINOPHEN 325 MG PO TABS: 650.0000 mg | ORAL_TABLET | Freq: Four times a day (QID) | ORAL | Status: DC | PRN

## 2014-06-22 MED ORDER — VITAMIN B-1 100 MG PO TABS
100.0000 mg | ORAL_TABLET | Freq: Every day | ORAL | Status: DC
  Administered 2014-06-23 – 2014-06-28 (×6): 100 mg via ORAL
  Filled 2014-06-22 (×8): qty 1

## 2014-06-22 MED ORDER — ALBUTEROL SULFATE HFA 108 (90 BASE) MCG/ACT IN AERS: 1.0000 | INHALATION_SPRAY | Freq: Four times a day (QID) | RESPIRATORY_TRACT | Status: DC | PRN

## 2014-06-22 MED ORDER — CHLORDIAZEPOXIDE HCL 25 MG PO CAPS
25.0000 mg | ORAL_CAPSULE | Freq: Four times a day (QID) | ORAL | Status: DC
  Administered 2014-06-23: 25 mg via ORAL
  Filled 2014-06-22: qty 1

## 2014-06-22 MED ORDER — CHLORDIAZEPOXIDE HCL 25 MG PO CAPS: 25.0000 mg | ORAL_CAPSULE | Freq: Three times a day (TID) | ORAL | Status: DC

## 2014-06-22 MED ORDER — LOPERAMIDE HCL 2 MG PO CAPS: 2.0000 mg | ORAL_CAPSULE | ORAL | Status: AC | PRN

## 2014-06-22 MED ORDER — ALUM & MAG HYDROXIDE-SIMETH 200-200-20 MG/5ML PO SUSP
30.0000 mL | ORAL | Status: DC | PRN
  Administered 2014-06-26: 30 mL via ORAL
  Filled 2014-06-22: qty 30

## 2014-06-22 MED ORDER — MAGNESIUM HYDROXIDE 400 MG/5ML PO SUSP: 30.0000 mL | Freq: Every day | ORAL | Status: DC | PRN

## 2014-06-22 MED ORDER — ADULT MULTIVITAMIN W/MINERALS CH
1.0000 | ORAL_TABLET | Freq: Every day | ORAL | Status: DC
  Administered 2014-06-23 – 2014-06-28 (×6): 1 via ORAL
  Filled 2014-06-22 (×8): qty 1

## 2014-06-22 MED ORDER — THIAMINE HCL 100 MG/ML IJ SOLN
100.0000 mg | Freq: Once | INTRAMUSCULAR | Status: AC
  Administered 2014-06-23: 100 mg via INTRAMUSCULAR
  Filled 2014-06-22: qty 2

## 2014-06-22 NOTE — ED Notes (Signed)
Applied right thumb splint per provider request.
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
## 2014-06-22 NOTE — ED Notes (Signed)
Called report to [place] at [facility], then contacted [facility] for transport.

## 2014-06-22 NOTE — ED Notes (Addendum)
Pt presents to ED with mental health tech for medical clearance from [facility].  She has been admitted to [facility] for alcohol detox, and she will return there after discharge from ED. Also complains of pain at 3-7/10 in right thumb.

## 2014-06-22 NOTE — ED Provider Notes (Signed)
CSN: [id number]     Arrival date & time 06/22/14  2056 History  This chart was scribed for non-physician practitioner, Otelia Santee, PA-C working with Wandra Arthurs, MD by Rayna Sexton, ED scribe. This patient was seen in room [facility]TR4/[facility]PT4 and the patient's care was started at 9:45 PM.    Chief Complaint  Patient presents with  . Medical Clearance  . Joint Pain   The history is provided by the patient. No language interpreter was used.    HPI Comments: Melissa Wolfe is a 64 y.o. female who presents to the Emergency Department for medical clearance for an alcohol detox. Pt was seen at [facility] and sent here for medical clearence and stability. She denies any SI/HI or AVN.   She has had right thumb pain for the past 2 weeks without known injury. Now she states the lateral aspect is numb to touch.  Past Medical History  Diagnosis Date  . Hypothyroid   . Hypertension   . Hyperlipemia   . Depression   . Alcoholism    Past Surgical History  Procedure Laterality Date  . Mastectomy    . Oophorectomy Right   . Fallopian ablation    . Myomectomy     History reviewed. No pertinent family history. History  Substance Use Topics  . Smoking status: Current Some Day Smoker -- 0.10 packs/day    Last Attempt to Quit: 02/28/2010  . Smokeless tobacco: Not on file  . Alcohol Use: Yes     Comment: 12 oz vodka daily minimum   OB History    No data available     Review of Systems  Constitutional: Negative for fever and chills.  HENT: Negative.   Respiratory: Negative.   Cardiovascular: Negative.   Gastrointestinal: Negative.   Musculoskeletal:       See HPI.  Skin: Negative for color change.  Neurological: Negative.   All other systems reviewed and are negative.     Allergies  Codeine and Cephalexin  Home Medications   Prior to Admission medications   Medication Sig Start Date End Date Taking? Authorizing Provider  albuterol (PROVENTIL HFA;VENTOLIN HFA) 108  (90 BASE) MCG/ACT inhaler Inhale 1-2 puffs into the lungs every 6 (six) hours as needed for wheezing or shortness of breath. 03/01/14   Pamella Pert, MD  atorvastatin (LIPITOR) 20 MG tablet Take 20 mg by mouth daily.    Historical Provider, MD  levothyroxine (SYNTHROID, LEVOTHROID) 137 MCG tablet Take 137 mcg by mouth daily before breakfast.    Historical Provider, MD  predniSONE (DELTASONE) 20 MG tablet Take 2 tablets by mouth on day 1 and 2. Take 1 tablet by mouth on day 3. 03/01/14   Pamella Pert, MD  UNKNOWN TO PATIENT BP meds    Historical Provider, MD  venlafaxine XR (EFFEXOR-XR) 37.5 MG 24 hr capsule Take 37.5 mg by mouth daily with breakfast.    Historical Provider, MD   BP 181/83 mmHg  Pulse 86  Temp(Src) 97.7 F (36.5 C) (Oral)  Resp 14  Ht 5\' 1"  (1.549 m)  Wt 177 lb (80.287 kg)  BMI 33.46 kg/m2  SpO2 100% Physical Exam  Constitutional: She is oriented to person, place, and time. She appears well-developed and well-nourished. No distress.  HENT:  Head: Normocephalic and atraumatic.  Mouth/Throat: Oropharynx is clear and moist.  Eyes: Conjunctivae and EOM are normal. Pupils are equal, round, and reactive to light.  Neck: Normal range of motion. Neck supple. No tracheal deviation present.  Cardiovascular: Normal rate, regular rhythm and normal heart sounds.   Pulmonary/Chest: Effort normal and breath sounds normal. No respiratory distress.  Abdominal: Soft.  Musculoskeletal: Normal range of motion.  Right thumb is unremarkable in appearance. Tender over dorsal PIP. FROM with pain on movement. Nail intact with nontender cuticle.   Neurological: She is alert and oriented to person, place, and time.  Skin: Skin is warm and dry.  Psychiatric: She has a normal mood and affect. Her behavior is normal.  Nursing note and vitals reviewed.   ED Course  Procedures  DIAGNOSTIC STUDIES: Oxygen Saturation is 100% on RA, normal by my interpretation.    COORDINATION OF  CARE: 9:50 PM Discussed treatment plan with pt at bedside and pt agreed to plan.  Labs Review Labs Reviewed  ACETAMINOPHEN LEVEL  CBC  COMPREHENSIVE METABOLIC PANEL  ETHANOL  SALICYLATE LEVEL  URINE RAPID DRUG SCREEN, HOSP PERFORMED   Results for orders placed or performed during the hospital encounter of 06/22/14  Acetaminophen level  Result Value Ref Range   Acetaminophen (Tylenol), Serum <10 (L) 10 - 30 ug/mL  CBC  Result Value Ref Range   WBC 7.0 4.0 - 10.5 K/uL   RBC 4.35 3.87 - 5.11 MIL/uL   Hemoglobin 13.6 12.0 - 15.0 g/dL   HCT 40.9 36.0 - 46.0 %   MCV 94.0 78.0 - 100.0 fL   MCH 31.3 26.0 - 34.0 pg   MCHC 33.3 30.0 - 36.0 g/dL   RDW 12.8 11.5 - 15.5 %   Platelets 272 150 - 400 K/uL  Comprehensive metabolic panel  Result Value Ref Range   Sodium 142 135 - 145 mmol/L   Potassium 3.6 3.5 - 5.1 mmol/L   Chloride 105 101 - 111 mmol/L   CO2 27 22 - 32 mmol/L   Glucose, Bld 103 (H) 65 - 99 mg/dL   BUN 15 6 - 20 mg/dL   Creatinine, Ser 0.81 0.44 - 1.00 mg/dL   Calcium 9.4 8.9 - 10.3 mg/dL   Total Protein 7.5 6.5 - 8.1 g/dL   Albumin 3.9 3.5 - 5.0 g/dL   AST 18 15 - 41 U/L   ALT 16 14 - 54 U/L   Alkaline Phosphatase 115 38 - 126 U/L   Total Bilirubin 0.5 0.3 - 1.2 mg/dL   GFR calc non Af Amer >60 >60 mL/min   GFR calc Af Amer >60 >60 mL/min   Anion gap 10 5 - 15  Ethanol (ETOH)  Result Value Ref Range   Alcohol, Ethyl (B) <5 <5 mg/dL  Salicylate level  Result Value Ref Range   Salicylate Lvl <1.6 2.8 - 30.0 mg/dL  Urine rapid drug screen (hosp performed)not at [facility]  Result Value Ref Range   Opiates NONE DETECTED NONE DETECTED   Cocaine NONE DETECTED NONE DETECTED   Benzodiazepines NONE DETECTED NONE DETECTED   Amphetamines NONE DETECTED NONE DETECTED   Tetrahydrocannabinol NONE DETECTED NONE DETECTED   Barbiturates NONE DETECTED NONE DETECTED    Imaging Review No results found.   EKG Interpretation None      MDM   Final diagnoses:  None    1.  Alcohol dependence 2. Right thumb strain  Splint applied to right thumb for comfort. She is medically cleared to return to [facility] for treatment of alcoholism.   I personally performed the services described in this documentation, which was scribed in my presence. The recorded information has been reviewed and is accurate.     Charlann Lange, PA-C  06/22/14 2239  Wandra Arthurs, MD 06/23/14 2306

## 2014-06-23 ENCOUNTER — Encounter (HOSPITAL_COMMUNITY): Payer: Self-pay

## 2014-06-23 DIAGNOSIS — F331 Major depressive disorder, recurrent, moderate: Secondary | ICD-10-CM

## 2014-06-23 DIAGNOSIS — F1020 Alcohol dependence, uncomplicated: Secondary | ICD-10-CM | POA: Diagnosis present

## 2014-06-23 DIAGNOSIS — F10230 Alcohol dependence with withdrawal, uncomplicated: Principal | ICD-10-CM

## 2014-06-23 MED ORDER — IRBESARTAN 150 MG PO TABS
150.0000 mg | ORAL_TABLET | Freq: Two times a day (BID) | ORAL | Status: DC
Start: 2014-06-23 — End: 2014-06-28
  Administered 2014-06-23 – 2014-06-28 (×12): 150 mg via ORAL
  Filled 2014-06-23: qty 2
  Filled 2014-06-23 (×3): qty 1
  Filled 2014-06-23: qty 2
  Filled 2014-06-23 (×13): qty 1
  Filled 2014-06-23: qty 2

## 2014-06-23 MED ORDER — TRAZODONE HCL 50 MG PO TABS
50.0000 mg | ORAL_TABLET | Freq: Every evening | ORAL | Status: DC | PRN
  Administered 2014-06-23 (×2): 50 mg via ORAL
  Filled 2014-06-23 (×6): qty 1

## 2014-06-23 MED ORDER — NICOTINE 14 MG/24HR TD PT24
14.0000 mg | MEDICATED_PATCH | Freq: Every day | TRANSDERMAL | Status: DC
  Administered 2014-06-23 – 2014-06-28 (×5): 14 mg via TRANSDERMAL
  Filled 2014-06-23 (×4): qty 1
  Filled 2014-06-23: qty 14
  Filled 2014-06-23 (×3): qty 1

## 2014-06-23 MED ORDER — LORAZEPAM 1 MG PO TABS
1.0000 mg | ORAL_TABLET | Freq: Three times a day (TID) | ORAL | Status: AC
  Administered 2014-06-25 (×2): 1 mg via ORAL
  Filled 2014-06-23 (×2): qty 1

## 2014-06-23 MED ORDER — LORAZEPAM 1 MG PO TABS
1.0000 mg | ORAL_TABLET | Freq: Two times a day (BID) | ORAL | Status: AC
  Administered 2014-06-26 (×2): 1 mg via ORAL
  Filled 2014-06-23 (×2): qty 1

## 2014-06-23 MED ORDER — LORAZEPAM 1 MG PO TABS
1.0000 mg | ORAL_TABLET | Freq: Four times a day (QID) | ORAL | Status: AC
  Administered 2014-06-23 – 2014-06-24 (×4): 1 mg via ORAL
  Filled 2014-06-23 (×6): qty 1

## 2014-06-23 MED ORDER — THIAMINE HCL 100 MG/ML IJ SOLN: 100.0000 mg | Freq: Once | INTRAMUSCULAR | Status: DC

## 2014-06-23 MED ORDER — LORAZEPAM 1 MG PO TABS: 1.0000 mg | ORAL_TABLET | Freq: Four times a day (QID) | ORAL | Status: AC | PRN

## 2014-06-23 MED ORDER — LORAZEPAM 1 MG PO TABS
1.0000 mg | ORAL_TABLET | Freq: Every day | ORAL | Status: AC
  Administered 2014-06-27: 1 mg via ORAL
  Filled 2014-06-23: qty 1

## 2014-06-23 NOTE — [facility] Group Notes (Signed)
[facility] LCSW Aftercare Discharge Planning Group Note  06/23/2014 8:45 AM  Pt did not attend, sleeping in room.  Peri Maris, Dora 06/23/2014 1:04 PM

## 2014-06-23 NOTE — Progress Notes (Signed)
Recreation Therapy Notes  Date: 06.22.16 Time: 9:30 am Location: [address] Dayroom  Group Topic: Stress Management  Goal Area(s) Addresses:  Patient will verbalize importance of using healthy stress management.  Patient will identify positive emotions associated with healthy stress management.   Intervention: Guided Imagery Script  Activity :  LRT will introduce and educate patient on the stress management technique of guided imagery.  A script was used to deliver the technique to patients.  Patients were asked to follow the script read aloud by the LRT to engage in practicing guided imagery.  Education:  Stress Management, Discharge Planning.   Clinical Observations/Feedback: Patient did not attend group.  Victorino Sparrow, LRT/CTRS         Victorino Sparrow A 06/23/2014 4:41 PM
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
## 2014-06-23 NOTE — Progress Notes (Signed)
Pt provided list of phone numbers for her doctors. Tilda Burrow primary [phone], Dr. B psychiatrist [phone] 7/15 therapy 7/19
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
## 2014-06-23 NOTE — Progress Notes (Signed)
Patient ID: Melissa Wolfe, female   DOB: 1950-09-19, 64 y.o.   MRN: [id number] Admission note: D:Patient is a voluntary admission in no acute distress for ETOH dependence and depression. Pt reports she has been drinking heavily for the past 4-5 months. Pt report she drink more than 4 oz of vodka daily. Pt report she lost her job about 4 months ago and have some financial difficulties. Pt reports her daughter and grandson lives with her and are very supportive. Pt has hx of right partial mastectomy.  A: Pt admitted to unit per protocol, skin assessment and belonging search done. No skin issues noted. Consent signed by pt. Pt educated on therapeutic milieu rules. Pt was introduced to milieu by nursing staff. Fall risk safety plan explained to the patient. Medication administered as prescribed.15 minutes checks started for safety.  R: Pt was receptive to education. Writer offered support.

## 2014-06-23 NOTE — Progress Notes (Signed)
D:Pt was lethargic and unable to take her medications this morning during morning 0800 assessment. Pt was given librium earlier this morning. Held 0800 medications and gave later in the morning after pt woke up. Pt is pleasant and cooperative. Held 1200 librium as pt had woke up recently and reporting no withdrawal symptoms.  A:Offered support and 15 minute checks. R:Pt denies si and hi. Safety maintained on the unit.

## 2014-06-23 NOTE — Tx Team (Signed)
Initial Interdisciplinary Treatment Plan   PATIENT STRESSORS: Financial difficulties Loss of employment Substance abuse   PATIENT STRENGTHS: Ability for insight Average or above average intelligence General fund of knowledge Motivation for treatment/growth Supportive family/friends   PROBLEM LIST: Problem List/Patient Goals Date to be addressed Date deferred Reason deferred Estimated date of resolution  "I want to loss the taste of ETOH" 06/22/2014     Depression 06/22/2014     anxiety 06/22/2014     Substance abuse 06/22/2014                                    DISCHARGE CRITERIA:  Ability to meet basic life and health needs Improved stabilization in mood, thinking, and/or behavior Medical problems require only outpatient monitoring Withdrawal symptoms are absent or subacute and managed without 24-hour nursing intervention  PRELIMINARY DISCHARGE PLAN: Attend PHP/IOP Attend 12-step recovery group Return to previous living arrangement  PATIENT/FAMIILY INVOLVEMENT: This treatment plan has been presented to and reviewed with the patient, Melissa Wolfe, The patient and family have been given the opportunity to ask questions and make suggestions.  JEHU-APPIAH, Rhayne Chatwin K 06/23/2014, 3:06 AM

## 2014-06-23 NOTE — Discharge Instructions (Signed)
TO GO DIRECTLY TO [facility] FOR ADMISSION TO READY BED

## 2014-06-23 NOTE — BHH Suicide Risk Assessment (Signed)
[facility] Admission Suicide Risk Assessment   Nursing information obtained from:    Demographic factors:    Current Mental Status:    Loss Factors:    Historical Factors:    Risk Reduction Factors:    Total Time spent with patient: 45 minutes Principal Problem: <principal problem not specified> Diagnosis:   Patient Active Problem List   Diagnosis Date Noted  . Alcohol abuse with alcohol-induced disorder [F10.19] 06/22/2014     Continued Clinical Symptoms:  Alcohol Use Disorder Identification Test Final Score (AUDIT): 26 The "Alcohol Use Disorders Identification Test", Guidelines for Use in Primary Care, Second Edition.  World Pharmacologist [facility]). Score between 0-7:  no or low risk or alcohol related problems. Score between 8-15:  moderate risk of alcohol related problems. Score between 16-19:  high risk of alcohol related problems. Score 20 or above:  warrants further diagnostic evaluation for alcohol dependence and treatment.   CLINICAL FACTORS:   Depression:   Comorbid alcohol abuse/dependence Alcohol/Substance Abuse/Dependencies  Psychiatric Specialty Exam: Physical Exam  ROS  Blood pressure 163/79, pulse 63, temperature 98.1 F (36.7 C), temperature source Oral, resp. rate 18, height 5' 0.24" (1.53 m), weight 83.008 kg (183 lb).Body mass index is 35.46 kg/(m^2).    COGNITIVE FEATURES THAT CONTRIBUTE TO RISK:  Closed-mindedness, Polarized thinking and Thought constriction (tunnel vision)    SUICIDE RISK:   Mild:  Suicidal ideation of limited frequency, intensity, duration, and specificity.  There are no identifiable plans, no associated intent, mild dysphoria and related symptoms, good self-control (both objective and subjective assessment), few other risk factors, and identifiable protective factors, including available and accessible social support.  PLAN OF CARE: Supportive approach/coping skills                               Detox as needed/reassess and address the  co morbidities                               Explore residential treatment options  Medical Decision Making:  Review of Psycho-Social Stressors (1), Review or order clinical lab tests (1), Review of Medication Regimen & Side Effects (2) and Review of New Medication or Change in Dosage (2)  I certify that inpatient services furnished can reasonably be expected to improve the patient's condition.   Tikisha Molinaro A 06/23/2014, 8:04 PM

## 2014-06-23 NOTE — Progress Notes (Signed)
Pt. Attended AA group.

## 2014-06-23 NOTE — BHH Group Notes (Signed)
[place] LCSW Group Therapy 06/23/2014 1:15 PM  Type of Therapy: Group Therapy- Emotion Regulation  Participation Level: Active   Participation Quality:  Appropriate  Affect: Appropriate  Cognitive: Alert and Oriented   Insight:  Developing/Improving  Engagement in Therapy: Developing/Improving and Engaged   Modes of Intervention: Clarification, Confrontation, Discussion, Education, Exploration, Limit-setting, Orientation, Problem-solving, Rapport Building, Art therapist, Socialization and Support  Summary of Progress/Problems: The topic for group today was emotional regulation. This group focused on both positive and negative emotion identification and allowed group members to process ways to identify feelings, regulate negative emotions, and find healthy ways to manage internal/external emotions. Group members were asked to reflect on a time when their reaction to an emotion led to a negative outcome and explored how alternative responses using emotion regulation would have benefited them. Group members were also asked to discuss a time when emotion regulation was utilized when a negative emotion was experienced. Pt identified difficulty managing feelings of stress related to expectations of others. Pt was able to contribute to group discussion related to regulation strategies. She was able to offer suggestions as well to other group members.    Peri Maris, [place] 06/23/2014 2:19 PM

## 2014-06-24 MED ORDER — ACAMPROSATE CALCIUM 333 MG PO TBEC
666.0000 mg | DELAYED_RELEASE_TABLET | Freq: Three times a day (TID) | ORAL | Status: DC
  Administered 2014-06-24 – 2014-06-28 (×12): 666 mg via ORAL
  Filled 2014-06-24: qty 84
  Filled 2014-06-24 (×14): qty 2
  Filled 2014-06-24: qty 84
  Filled 2014-06-24: qty 2
  Filled 2014-06-24: qty 84

## 2014-06-24 MED ORDER — BUPROPION HCL ER (XL) 150 MG PO TB24
150.0000 mg | ORAL_TABLET | Freq: Every day | ORAL | Status: DC
  Administered 2014-06-24 – 2014-06-27 (×4): 150 mg via ORAL
  Filled 2014-06-24 (×6): qty 1

## 2014-06-24 MED ORDER — TRAZODONE 25 MG HALF TABLET
25.0000 mg | ORAL_TABLET | Freq: Every evening | ORAL | Status: DC | PRN
  Administered 2014-06-24 (×2): 25 mg via ORAL
  Filled 2014-06-24 (×4): qty 1

## 2014-06-24 NOTE — BHH Group Notes (Signed)
[place] LCSW Group Therapy 06/24/2014 1:15pm  Type of Therapy: Group Therapy- Balance in Life  Participation Level: Lethargic  Description of the Group:  The topic for group was balance in life. Today's group focused on defining balance in one's own words, identifying things that can knock one off balance, and exploring healthy ways to maintain balance in life. Group members were asked to provide an example of a time when they felt off balance, describe how they handled that situation,and process healthier ways to regain balance in the future. Group members were asked to share the most important tool for maintaining balance that they learned while at [facility] and how they plan to apply this method after discharge.  Summary of Patient Progress Pt attended group but was noted to be sleepy throughout group. Pt slept through most of group discussion. However, at the end of the session, Pt was observed to be tearful. When asked what was wrong, Pt began discussing the declining health of her dog and that she may have to have him put to sleep when she is DC. Pt was very tearful, however, was receptive to encouragement from peers and CSW.     Therapeutic Modalities:   Cognitive Behavioral Therapy Solution-Focused Therapy Assertiveness Training   Peri Maris, Latanya Presser 06/24/2014 5:23 PM

## 2014-06-24 NOTE — Progress Notes (Signed)
D: Pt is alert and oriented x3. Pt endorses mild depression of 2; scale 0-10. Pt however denies SI/HI. Pt attitude has been positive. Pt unhappy about her drinking but very hopeful; she states, "I feel terrible about how much I drink, but I know there is hope" A: Pt was offered support and encouragement. Meds were offered as prescribed. Pt was also provided with safe environment. R: Pt attended group. 15 minute safety checks continue. Pt accepted meds without complaint.

## 2014-06-24 NOTE — BHH Suicide Risk Assessment (Signed)
[facility] INPATIENT:  Family/Significant Other Suicide Prevention Education  Suicide Prevention Education:  Education Completed; Berkley Harvey, Pt's daughter, has been identified by the patient as the family member/significant other with whom the patient will be residing, and identified as the person(s) who will aid the patient in the event of a mental health crisis (suicidal ideations/suicide attempt).  With written consent from the patient, the family member/significant other has been provided the following suicide prevention education, prior to the and/or following the discharge of the patient.  The suicide prevention education provided includes the following:  Suicide risk factors  Suicide prevention and interventions  National Suicide Hotline telephone number  [facility] assessment telephone number  [facility] Emergency Assistance [place] and/or Residential Mobile Crisis Unit telephone number  Request made of family/significant other to:  Remove weapons (e.g., guns, rifles, knives), all items previously/currently identified as safety concern.    Remove drugs/medications (over-the-counter, prescriptions, illicit drugs), all items previously/currently identified as a safety concern.  The family member/significant other verbalizes understanding of the suicide prevention education information provided.  The family member/significant other agrees to remove the items of safety concern listed above.  Bo Mcclintock 06/24/2014, 4:35 PM

## 2014-06-24 NOTE — Progress Notes (Signed)
Patient did not attend the evening karaoke group. Pt was notified that group was beginning but remained in her room. Pt reported getting used to her medicine and feeling a little drowsy.

## 2014-06-24 NOTE — Progress Notes (Signed)
[facility] MD Progress Note  06/24/2014 6:44 PM Melissa Wolfe  MRN:  [id number] Subjective:  Melissa Wolfe is having a hard time today. Just learned that her [place] of 16 years is really sick and she might need to put him down. She states she is going to have a hard time coping with this at the same time feels she should be the one being there when it happens. States she is afraid that she is not going to be able to deal with all the emotions and that she might relapse. She is still wanting to go to rehab but states that with her Medicare Part A she is not sure if she is going to be able to do so. She admits to persistent depression. She spoke with her grandson and that brightened her day Principal Problem: Alcohol dependence Diagnosis:   Patient Active Problem List   Diagnosis Date Noted  . Alcohol dependence [F10.20] 06/23/2014  . Depression, major, recurrent, moderate [F33.1] 06/23/2014  . Alcohol abuse with alcohol-induced disorder [F10.19] 06/22/2014   Total Time spent with patient: 30 minutes   Past Medical History:  Past Medical History  Diagnosis Date  . Hypothyroid   . Hypertension   . Hyperlipemia   . Depression   . Alcoholism     Past Surgical History  Procedure Laterality Date  . Mastectomy    . Oophorectomy Right   . Fallopian ablation    . Myomectomy     Family History: History reviewed. No pertinent family history. Social History:  History  Alcohol Use  . Yes    Comment: 12 oz vodka daily minimum     History  Drug Use No    History   Social History  . Marital Status: Legally Separated    Spouse Name: N/A  . Number of Children: N/A  . Years of Education: N/A   Social History Main Topics  . Smoking status: Current Some Day Smoker -- 0.10 packs/day    Last Attempt to Quit: 02/28/2010  . Smokeless tobacco: Not on file  . Alcohol Use: Yes     Comment: 12 oz vodka daily minimum  . Drug Use: No  . Sexual Activity: Not on file   Other Topics Concern  .  None   Social History Narrative   Additional History:    Sleep: Fair  Appetite:  Fair   Assessment:   Musculoskeletal: Strength & Muscle Tone: within normal limits Gait & Station: normal Patient leans: normal   Psychiatric Specialty Exam: Physical Exam  Review of Systems  Constitutional: Positive for malaise/fatigue.  HENT: Negative.   Eyes: Negative.   Respiratory: Negative.   Cardiovascular: Negative.   Gastrointestinal: Negative.   Genitourinary: Negative.   Musculoskeletal: Negative.   Skin: Negative.   Neurological: Positive for weakness.  Endo/Heme/Allergies: Negative.   Psychiatric/Behavioral: Positive for depression and substance abuse. The patient is nervous/anxious.     Blood pressure 132/64, pulse 68, temperature 97.8 F (36.6 C), temperature source Oral, resp. rate 20, height 5' 0.24" (1.53 m), weight 83.008 kg (183 lb).Body mass index is 35.46 kg/(m^2).  General Appearance: Fairly Groomed  Engineer, water::  Fair  Speech:  Clear and Coherent  Volume:  Normal  Mood:  Anxious and Depressed  Affect:  Depressed and Tearful  Thought Process:  Coherent and Goal Directed  Orientation:  Full (Time, Place, and Person)  Thought Content:  most recent events symptoms worries concerns  Suicidal Thoughts:  No  Homicidal Thoughts:  No  Memory:  Immediate;   Fair Recent;   Fair Remote;   Fair  Judgement:  Fair  Insight:  Present  Psychomotor Activity:  Restlessness  Concentration:  Fair  Recall:  [employer] of Knowledge:Fair  Language: Fair  Akathisia:  No  Handed:  Right  AIMS (if indicated):     Assets:  Desire for Improvement Housing Social Support  ADL's:  Intact  Cognition: WNL  Sleep:  Number of Hours: 6.75     Current Medications: Current Facility-Administered Medications  Medication Dose Route Frequency Provider Last Rate Last Dose  . acamprosate (CAMPRAL) tablet 666 mg  666 mg Oral TID WC Nicholaus Bloom, MD   666 mg at 06/24/14 1656  .  acetaminophen (TYLENOL) tablet 650 mg  650 mg Oral Q6H PRN Laverle Hobby, PA-C      . albuterol (PROVENTIL HFA;VENTOLIN HFA) 108 (90 BASE) MCG/ACT inhaler 1-2 puff  1-2 puff Inhalation Q6H PRN Laverle Hobby, PA-C      . alum & mag hydroxide-simeth (MAALOX/MYLANTA) 200-200-20 MG/5ML suspension 30 mL  30 mL Oral Q4H PRN Laverle Hobby, PA-C      . atorvastatin (LIPITOR) tablet 20 mg  20 mg Oral Daily Laverle Hobby, PA-C   20 mg at 06/24/14 0802  . buPROPion (WELLBUTRIN XL) 24 hr tablet 150 mg  150 mg Oral Daily Nicholaus Bloom, MD   150 mg at 06/24/14 1321  . hydrOXYzine (ATARAX/VISTARIL) tablet 25 mg  25 mg Oral Q6H PRN Laverle Hobby, PA-C      . irbesartan (AVAPRO) tablet 150 mg  150 mg Oral BID Laverle Hobby, PA-C   150 mg at 06/24/14 1657  . levothyroxine (SYNTHROID, LEVOTHROID) tablet 137 mcg  137 mcg Oral QAC breakfast Laverle Hobby, PA-C   137 mcg at 06/24/14 4239  . loperamide (IMODIUM) capsule 2-4 mg  2-4 mg Oral PRN Laverle Hobby, PA-C      . LORazepam (ATIVAN) tablet 1 mg  1 mg Oral Q6H PRN Nicholaus Bloom, MD      . LORazepam (ATIVAN) tablet 1 mg  1 mg Oral QID Nicholaus Bloom, MD   1 mg at 06/24/14 1657   Followed by  . [START ON 06/25/2014] LORazepam (ATIVAN) tablet 1 mg  1 mg Oral TID Nicholaus Bloom, MD       Followed by  . [START ON 06/26/2014] LORazepam (ATIVAN) tablet 1 mg  1 mg Oral BID Nicholaus Bloom, MD       Followed by  . [START ON 06/27/2014] LORazepam (ATIVAN) tablet 1 mg  1 mg Oral Daily Nicholaus Bloom, MD      . magnesium hydroxide (MILK OF MAGNESIA) suspension 30 mL  30 mL Oral Daily PRN Laverle Hobby, PA-C      . multivitamin with minerals tablet 1 tablet  1 tablet Oral Daily Laverle Hobby, PA-C   1 tablet at 06/24/14 0802  . nicotine (NICODERM CQ - dosed in mg/24 hours) patch 14 mg  14 mg Transdermal Daily Nicholaus Bloom, MD   14 mg at 06/24/14 0805  . ondansetron (ZOFRAN-ODT) disintegrating tablet 4 mg  4 mg Oral Q6H PRN Laverle Hobby, PA-C      . thiamine  (B-1) injection 100 mg  100 mg Intramuscular Once Nicholaus Bloom, MD   100 mg at 06/23/14 1601  . thiamine (VITAMIN B-1) tablet 100 mg  100 mg Oral Daily Laverle Hobby, PA-C  100 mg at 06/24/14 0802  . traZODone (DESYREL) tablet 25 mg  25 mg Oral QHS,MR X 1 Nicholaus Bloom, MD        Lab Results:  Results for orders placed or performed during the hospital encounter of 06/22/14 (from the past 48 hour(s))  Urine rapid drug screen (hosp performed)not at [facility]     Status: None   Collection Time: 06/22/14  9:16 PM  Result Value Ref Range   Opiates NONE DETECTED NONE DETECTED   Cocaine NONE DETECTED NONE DETECTED   Benzodiazepines NONE DETECTED NONE DETECTED   Amphetamines NONE DETECTED NONE DETECTED   Tetrahydrocannabinol NONE DETECTED NONE DETECTED   Barbiturates NONE DETECTED NONE DETECTED    Comment:        DRUG SCREEN FOR MEDICAL PURPOSES ONLY.  IF CONFIRMATION IS NEEDED FOR ANY PURPOSE, NOTIFY LAB WITHIN 5 DAYS.        LOWEST DETECTABLE LIMITS FOR URINE DRUG SCREEN Drug Class       Cutoff (ng/mL) Amphetamine      1000 Barbiturate      200 Benzodiazepine   356 Tricyclics       861 Opiates          300 Cocaine          300 THC              50   Acetaminophen level     Status: Abnormal   Collection Time: 06/22/14  9:24 PM  Result Value Ref Range   Acetaminophen (Tylenol), Serum <10 (L) 10 - 30 ug/mL    Comment:        THERAPEUTIC CONCENTRATIONS VARY SIGNIFICANTLY. A RANGE OF 10-30 ug/mL MAY BE AN EFFECTIVE CONCENTRATION FOR MANY PATIENTS. HOWEVER, SOME ARE BEST TREATED AT CONCENTRATIONS OUTSIDE THIS RANGE. ACETAMINOPHEN CONCENTRATIONS >150 ug/mL AT 4 HOURS AFTER INGESTION AND >50 ug/mL AT 12 HOURS AFTER INGESTION ARE OFTEN ASSOCIATED WITH TOXIC REACTIONS.   CBC     Status: None   Collection Time: 06/22/14  9:24 PM  Result Value Ref Range   WBC 7.0 4.0 - 10.5 K/uL   RBC 4.35 3.87 - 5.11 MIL/uL   Hemoglobin 13.6 12.0 - 15.0 g/dL   HCT 40.9 36.0 - 46.0 %   MCV 94.0  78.0 - 100.0 fL   MCH 31.3 26.0 - 34.0 pg   MCHC 33.3 30.0 - 36.0 g/dL   RDW 12.8 11.5 - 15.5 %   Platelets 272 150 - 400 K/uL  Comprehensive metabolic panel     Status: Abnormal   Collection Time: 06/22/14  9:24 PM  Result Value Ref Range   Sodium 142 135 - 145 mmol/L   Potassium 3.6 3.5 - 5.1 mmol/L   Chloride 105 101 - 111 mmol/L   CO2 27 22 - 32 mmol/L   Glucose, Bld 103 (H) 65 - 99 mg/dL   BUN 15 6 - 20 mg/dL   Creatinine, Ser 0.81 0.44 - 1.00 mg/dL   Calcium 9.4 8.9 - 10.3 mg/dL   Total Protein 7.5 6.5 - 8.1 g/dL   Albumin 3.9 3.5 - 5.0 g/dL   AST 18 15 - 41 U/L   ALT 16 14 - 54 U/L   Alkaline Phosphatase 115 38 - 126 U/L   Total Bilirubin 0.5 0.3 - 1.2 mg/dL   GFR calc non Af Amer >60 >60 mL/min   GFR calc Af Amer >60 >60 mL/min    Comment: (NOTE) The eGFR has been calculated using the CKD  EPI equation. This calculation has not been validated in all clinical situations. eGFR's persistently <60 mL/min signify possible Chronic Kidney Disease.    Anion gap 10 5 - 15  Ethanol (ETOH)     Status: None   Collection Time: 06/22/14  9:24 PM  Result Value Ref Range   Alcohol, Ethyl (B) <5 <5 mg/dL    Comment:        LOWEST DETECTABLE LIMIT FOR SERUM ALCOHOL IS 5 mg/dL FOR MEDICAL PURPOSES ONLY   Salicylate level     Status: None   Collection Time: 06/22/14  9:24 PM  Result Value Ref Range   Salicylate Lvl <5.9 2.8 - 30.0 mg/dL    Physical Findings: AIMS: Facial and Oral Movements Muscles of Facial Expression: None, normal Lips and Perioral Area: None, normal Jaw: None, normal Tongue: None, normal,Extremity Movements Upper (arms, wrists, hands, fingers): None, normal Lower (legs, knees, ankles, toes): None, normal, Trunk Movements Neck, shoulders, hips: None, normal, Overall Severity Severity of abnormal movements (highest score from questions above): None, normal Incapacitation due to abnormal movements: None, normal Patient's awareness of abnormal movements  (rate only patient's report): No Awareness, Dental Status Current problems with teeth and/or dentures?: Yes Does patient usually wear dentures?: No  CIWA:  CIWA-Ar Total: 1 COWS:     Treatment Plan Summary: Daily contact with patient to assess and evaluate symptoms and progress in treatment and Medication management Supportive approach/copign skills Depression; will start Wellbutrin XL 150 mg in AM/will D/C the Effexor XR 37.5 mg daily alcohol dependence; will continue Ativan detox protocol Alcohol cravings; will start Campral 333 mg two TID Work a relapse prevention plan Grief/Bereavement anticipating loss of her do; help process the feelings associated to her dog being sick and her having to put him down Work on identifying ways she can cope without having to drink Explore residential treatment options Medical Decision Making:  Review of Psycho-Social Stressors (1), Review of Medication Regimen & Side Effects (2) and Review of New Medication or Change in Dosage (2)     Makesha Belitz A 06/24/2014, 6:44 PM

## 2014-06-24 NOTE — Progress Notes (Signed)
D:  Per pt self inventory pt reports sleeping good, appetite good, energy level low, ability to pay attention good, rates depression at a 6 to 7 out of 10, hopelessness at a 6 to 7 out of 10, denies anxiety, denies SI/HI/AVH, goal today: "discussing meds to help depression; decrease trazodone, think about med changes from Dr. Sabra Heck and talk with Dr. Sabra Heck, bright during interaction, hopeful and glad that she came in for help.  A:  Emotional support provided, Encouraged pt to continue with treatment plan and attend all group activities, q15 min checks maintained for safety.  R:  Pt is receptive, going to some groups, pleasant and cooperative with staff and other patients.

## 2014-06-24 NOTE — BHH Group Notes (Signed)
Adult Psychoeducational Group Note  Date:  06/24/2014 Time:  0900am  Group Topic/Focus:  Goals Group:   The focus of this group is to help patients establish daily goals to achieve during treatment and discuss how the patient can incorporate goal setting into their daily lives to aide in recovery. Orientation:   The focus of this group is to educate the patient on the purpose and policies of crisis stabilization and provide a format to answer questions about their admission.  The group details unit policies and expectations of patients while admitted.  Participation Level:  Did Not Attend  Wynn Banker 06/24/2014, 9:27 AM

## 2014-06-25 MED ORDER — RAMELTEON 8 MG PO TABS
8.0000 mg | ORAL_TABLET | Freq: Every day | ORAL | Status: DC
  Administered 2014-06-25 – 2014-06-27 (×3): 8 mg via ORAL
  Filled 2014-06-25 (×3): qty 1
  Filled 2014-06-25: qty 14
  Filled 2014-06-25 (×2): qty 1

## 2014-06-25 NOTE — Progress Notes (Signed)
[facility] MD Progress Note  06/25/2014 1:07 PM Melissa Wolfe  MRN:  [id number] Subjective:  Melissa Wolfe is having a hard time. She is still dealing with the anticipation of having to put his dog to sleep. She is very taerful she is not sleeping well and is overall expressing a sense of helplessness hopessness. Her daughter wanted [name] to know that she has evidenced a lot of mood swings and her daugther wants [name] to consider that she might "be bipolar". Melissa Wolfe would still like to go to a rehab program as not sure if she can make it otherwise Principal Problem: Alcohol dependence Diagnosis:   Patient Active Problem List   Diagnosis Date Noted  . Alcohol dependence [F10.20] 06/23/2014  . Depression, major, recurrent, moderate [F33.1] 06/23/2014  . Alcohol abuse with alcohol-induced disorder [F10.19] 06/22/2014   Total Time spent with patient: 30 minutes   Past Medical History:  Past Medical History  Diagnosis Date  . Hypothyroid   . Hypertension   . Hyperlipemia   . Depression   . Alcoholism     Past Surgical History  Procedure Laterality Date  . Mastectomy    . Oophorectomy Right   . Fallopian ablation    . Myomectomy     Family History: History reviewed. No pertinent family history. Social History:  History  Alcohol Use  . Yes    Comment: 12 oz vodka daily minimum     History  Drug Use No    History   Social History  . Marital Status: Legally Separated    Spouse Name: N/A  . Number of Children: N/A  . Years of Education: N/A   Social History Main Topics  . Smoking status: Current Some Day Smoker -- 0.10 packs/day    Last Attempt to Quit: 02/28/2010  . Smokeless tobacco: Not on file  . Alcohol Use: Yes     Comment: 12 oz vodka daily minimum  . Drug Use: No  . Sexual Activity: Not on file   Other Topics Concern  . None   Social History Narrative   Additional History:    Sleep: Poor  Appetite:  Fair   Assessment:   Musculoskeletal: Strength & Muscle  Tone: within normal limits Gait & Station: normal Patient leans: normal   Psychiatric Specialty Exam: Physical Exam  Review of Systems  Constitutional: Negative.   HENT: Negative.   Eyes: Negative.   Respiratory: Negative.   Cardiovascular: Negative.   Gastrointestinal: Negative.   Genitourinary: Negative.   Musculoskeletal: Negative.   Skin: Negative.   Neurological: Negative.   Endo/Heme/Allergies: Negative.   Psychiatric/Behavioral: Positive for depression and substance abuse. The patient is nervous/anxious and has insomnia.     Blood pressure 137/95, pulse 103, temperature 97.3 F (36.3 C), temperature source Oral, resp. rate 16, height 5' 0.24" (1.53 m), weight 83.008 kg (183 lb).Body mass index is 35.46 kg/(m^2).  General Appearance: Fairly Groomed  Engineer, water::  Fair  Speech:  Clear and Coherent  Volume:  Decreased  Mood:  Anxious and Depressed  Affect:  Depressed and Tearful  Thought Process:  Coherent and Goal Directed  Orientation:  Full (Time, Place, and Person)  Thought Content:  symptoms events worries concerns  Suicidal Thoughts:  not right now  Homicidal Thoughts:  No  Memory:  Immediate;   Fair Recent;   Fair Remote;   Fair  Judgement:  Fair  Insight:  Present  Psychomotor Activity:  Restlessness  Concentration:  Fair  Recall:  [employer]  of Knowledge:Fair  Language: Fair  Akathisia:  No  Handed:  Right  AIMS (if indicated):     Assets:  Desire for Improvement  ADL's:  Intact  Cognition: WNL  Sleep:  Number of Hours: 4.75     Current Medications: Current Facility-Administered Medications  Medication Dose Route Frequency Provider Last Rate Last Dose  . acamprosate (CAMPRAL) tablet 666 mg  666 mg Oral TID WC Nicholaus Bloom, MD   666 mg at 06/25/14 1149  . acetaminophen (TYLENOL) tablet 650 mg  650 mg Oral Q6H PRN Laverle Hobby, PA-C      . albuterol (PROVENTIL HFA;VENTOLIN HFA) 108 (90 BASE) MCG/ACT inhaler 1-2 puff  1-2 puff Inhalation Q6H  PRN Laverle Hobby, PA-C      . alum & mag hydroxide-simeth (MAALOX/MYLANTA) 200-200-20 MG/5ML suspension 30 mL  30 mL Oral Q4H PRN Laverle Hobby, PA-C      . atorvastatin (LIPITOR) tablet 20 mg  20 mg Oral Daily Laverle Hobby, PA-C   20 mg at 06/25/14 0801  . buPROPion (WELLBUTRIN XL) 24 hr tablet 150 mg  150 mg Oral Daily Nicholaus Bloom, MD   150 mg at 06/25/14 0801  . hydrOXYzine (ATARAX/VISTARIL) tablet 25 mg  25 mg Oral Q6H PRN Laverle Hobby, PA-C   25 mg at 06/24/14 2351  . irbesartan (AVAPRO) tablet 150 mg  150 mg Oral BID Laverle Hobby, PA-C   150 mg at 06/25/14 0801  . levothyroxine (SYNTHROID, LEVOTHROID) tablet 137 mcg  137 mcg Oral QAC breakfast Laverle Hobby, PA-C   137 mcg at 06/25/14 6767  . loperamide (IMODIUM) capsule 2-4 mg  2-4 mg Oral PRN Laverle Hobby, PA-C      . LORazepam (ATIVAN) tablet 1 mg  1 mg Oral Q6H PRN Nicholaus Bloom, MD      . LORazepam (ATIVAN) tablet 1 mg  1 mg Oral TID Nicholaus Bloom, MD   1 mg at 06/25/14 0801   Followed by  . [START ON 06/26/2014] LORazepam (ATIVAN) tablet 1 mg  1 mg Oral BID Nicholaus Bloom, MD       Followed by  . [START ON 06/27/2014] LORazepam (ATIVAN) tablet 1 mg  1 mg Oral Daily Nicholaus Bloom, MD      . magnesium hydroxide (MILK OF MAGNESIA) suspension 30 mL  30 mL Oral Daily PRN Laverle Hobby, PA-C      . multivitamin with minerals tablet 1 tablet  1 tablet Oral Daily Laverle Hobby, PA-C   1 tablet at 06/25/14 0801  . nicotine (NICODERM CQ - dosed in mg/24 hours) patch 14 mg  14 mg Transdermal Daily Nicholaus Bloom, MD   14 mg at 06/25/14 0802  . ondansetron (ZOFRAN-ODT) disintegrating tablet 4 mg  4 mg Oral Q6H PRN Laverle Hobby, PA-C      . ramelteon (ROZEREM) tablet 8 mg  8 mg Oral QHS Nicholaus Bloom, MD      . thiamine (B-1) injection 100 mg  100 mg Intramuscular Once Nicholaus Bloom, MD   100 mg at 06/23/14 1601  . thiamine (VITAMIN B-1) tablet 100 mg  100 mg Oral Daily Laverle Hobby, PA-C   100 mg at 06/25/14 2094     Lab Results: No results found for this or any previous visit (from the past 66 hour(s)).  Physical Findings: AIMS: Facial and Oral Movements Muscles of Facial Expression: None, normal Lips and Perioral  Area: None, normal Jaw: None, normal Tongue: None, normal,Extremity Movements Upper (arms, wrists, hands, fingers): None, normal Lower (legs, knees, ankles, toes): None, normal, Trunk Movements Neck, shoulders, hips: None, normal, Overall Severity Severity of abnormal movements (highest score from questions above): None, normal Incapacitation due to abnormal movements: None, normal Patient's awareness of abnormal movements (rate only patient's report): No Awareness, Dental Status Current problems with teeth and/or dentures?: Yes Does patient usually wear dentures?: No  CIWA:  CIWA-Ar Total: 1 COWS:     Treatment Plan Summary: Daily contact with patient to assess and evaluate symptoms and progress in treatment and Medication management Supportive approach/coping skills Alcohol dependence; continue to detox ( she is still having increased BP and pulse) Depression; continue to work with the Wellbutrin  Mood instability; will reassess for bipolarity as we detox ( hard to diagnosed bipolarity in the presence of alcohol use) R/O substance induced versus true comorbidity Work a relapse prevention plan Explore reisidential treatment options  Medical Decision Making:  Review of Psycho-Social Stressors (1), Review of Medication Regimen & Side Effects (2) and Review of New Medication or Change in Dosage (2)     Amillia Biffle A 06/25/2014, 1:07 PM

## 2014-06-25 NOTE — BHH Group Notes (Signed)
Interdisciplinary Treatment Plan Update (Adult) Date: 06/25/2014   Time Reviewed: 9:30 AM  Progress in Treatment: Attending groups: Minimally Participating in groups: Yes Taking medication as prescribed: Yes Tolerating medication: Yes Family/Significant other contact made: Yes, CSW has spoken with patient's daughter Patient understands diagnosis: Yes Discussing patient identified problems/goals with staff: Yes Medical problems stabilized or resolved: Yes Denies suicidal/homicidal ideation: Yes Issues/concerns per patient self-inventory: Yes Other:  New problem(s) identified: N/A  Discharge Plan or Barriers:   6/24: Patient plans to return home with outpatient services.  Reason for Continuation of Hospitalization:  Depression Anxiety Medication Stabilization   Comments: N/A  Estimated length of stay: 2-3 days  For review of initial/current patient goals, please see plan of care. Patient is a 64 year old African American female with a diagnosis of Alcohol Use Disorder, severe, and Major Depressive Disorder, recurrent, severe. Pt presented to the hospital with SI and requesting detox from alcohol. Pt is currently living with her daughter and grandson. She is open to inpatient or outpatient options for continued substance abuse treatment. Pt agreeable to SPE with daughter. Patient will benefit from crisis stabilization, medication evaluation, group therapy and psycho education in addition to case management for discharge planning.    Attendees: Patient:    Family:    Physician: Dr. Parke Poisson; Dr. Sabra Heck 06/25/2014 9:30 AM  Nursing: , RN 06/25/2014 9:30 AM  Clinical Social Worker: Tilden Fossa,  [place] 06/25/2014 9:30 AM  Other: Joette Catching, LCSW 06/25/2014 9:30 AM  Other: Lucinda Dell, [place] Liaison 06/25/2014 9:30 AM  Other: Lars Pinks, Case Manager 06/25/2014 9:30 AM  Other: Agustina Caroli, NP 06/25/2014 9:30 AM  Other:    Other:    Other:    Other:    Other:       Scribe for Treatment Team:  Tilden Fossa, MSW, [employer] [phone]

## 2014-06-25 NOTE — Progress Notes (Signed)
D:  Per pt self inventory pt reports sleeping good, appetite good, energy level low, ability to pay attention poor, rates depression at a 7 out of 10, hopelessness at a 7 out of 10, anxiety at a 5 out of 10, denies SI/HI/AVH, pt c/o feeling drowsy from nighttime medications "groggy" in am, brightens during interaction, goal today:  "getting home to dying dog"      A:  Emotional support provided, Encouraged pt to continue with treatment plan and attend all group activities, q15 min checks maintained for safety.  R:  Pt is receptive, going to groups, pleasant and cooperative with staff and other patients on the unit.

## 2014-06-25 NOTE — Plan of Care (Signed)
Problem: Alteration in mood & ability to function due to Goal: STG-Patient will attend groups Outcome: Progressing Pt has promised to attend group especially the AA groups.

## 2014-06-25 NOTE — BHH Group Notes (Signed)
[facility] LCSW Aftercare Discharge Planning Group Note  06/25/2014  8:45 AM  Participation Quality: Did Not Attend. Patient invited to participate but declined.   Tilden Fossa, MSW, [place] Worker [facility] [phone]

## 2014-06-25 NOTE — Progress Notes (Signed)
Recreation Therapy Notes  Date: 06.24.16 Time: 9:30 am Location: 300 Hall Group Room  Group Topic: Stress Management  Goal Area(s) Addresses:  Patient will verbalize importance of using healthy stress management.  Patient will identify positive emotions associated with healthy stress management.   Intervention: Stress Management  Activity :  Progressive Muscle Relaxation.  LRT introduced and educated patients on the stress management technique of progressive muscle relaxation.  A script was used to deliver the technique to patients.  Patients were asked to follow the script read aloud by LRT to engage in practicing the stress management techniques.  Education:  Stress Management, Discharge Planning.   Education Outcome: Acknowledges edcuation/In group clarification offered/Needs additional education  Clinical Observations/Feedback: Patient did not attend group.   Victorino Sparrow, LRT/CTRS         Ria Comment, [place] A 06/25/2014 1:19 PM
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
## 2014-06-25 NOTE — Progress Notes (Signed)
D: Pt is alert and oriented x3. Pt endorses mild depression of 2; scale 0-10. Pt however denies SI/HI. Pt attitude has been positive. Pt complaint about insomnia. Pt got a visit from mom and daughter today. Pt was happy about the visit but unhappy about them coming to see her as a Pt at [facility]. A: Meds were offered as prescribed. Pt was offered support, encouragement and a safe environment.  R: Pt refused to attended the [place] group today. 15 minute safety checks continue.

## 2014-06-25 NOTE — Progress Notes (Signed)
Pt was invited to group, however she stated she had already been to 3 AA meetings and did not attend.

## 2014-06-26 DIAGNOSIS — F1020 Alcohol dependence, uncomplicated: Secondary | ICD-10-CM

## 2014-06-26 DIAGNOSIS — F329 Major depressive disorder, single episode, unspecified: Secondary | ICD-10-CM

## 2014-06-26 MED ORDER — LORAZEPAM 0.5 MG PO TABS
0.5000 mg | ORAL_TABLET | Freq: Once | ORAL | Status: AC
  Administered 2014-06-26: 0.5 mg via ORAL
  Filled 2014-06-26: qty 1

## 2014-06-26 NOTE — Plan of Care (Signed)
Problem: Alteration in mood Goal: STG-Pt Able to Identify Plan For Continuing Care at D/C Pt. Will be able to identify a plan for continuing care at discharge  Outcome: Progressing Pt has two plans, one to go to a long term treatment facility or the second to go to a day program.

## 2014-06-26 NOTE — Progress Notes (Signed)
Patient ID: Melissa Wolfe, female   DOB: 08-Jun-1950, 64 y.o.   MRN: [id number]  Adult Psychoeducational Group Note  Date:  06/26/2014 Time: 09:00am  Group Topic/Focus:  Coping With Mental Health Crisis:   The purpose of this group is to help patients identify strategies for coping with mental health crisis.  Group discusses possible causes of crisis and ways to manage them effectively.  Participation Level:  Did Not Attend  Participation Quality:  n/a  Affect: n/a  Cognitive: n/a  Insight: n/a  Engagement in Group: n/a  Modes of Intervention:  Clarification, Discussion, Education, Orientation, Problem-solving and Support  Additional Comments:  Pt did not attend group. Pt in bed asleep.   Elenore Rota 06/26/2014, 3:32 PM

## 2014-06-26 NOTE — BHH Group Notes (Signed)
[facility] Group Notes: (Clinical Social Work)   06/26/2014      Type of Therapy:  Group Therapy   Participation Level:  Did Not Attend despite MHT prompting   Selmer Dominion, LCSW 06/26/2014, 12:33 PM

## 2014-06-26 NOTE — Plan of Care (Signed)
Problem: Alteration in mood & ability to function due to Goal: LTG-Pt reports reduction in suicidal thoughts (Patient reports reduction in suicidal thoughts and is able to verbalize a safety plan for whenever patient is feeling suicidal)  Outcome: Progressing Pt denies SI at this time Goal: STG-Patient will comply with prescribed medication regimen (Patient will comply with prescribed medication regimen)  Outcome: Progressing Pt takes meds as prescribed  Problem: Alteration in mood; excessive anxiety as evidenced by: Goal: LTG-Patient's behavior demonstrates decreased anxiety 06/23/2014: Goal not met. Pt was admitted with increased levels of anxiety and is currently rating those symptoms highly. Pt will demonstrated decreased symptoms of anxiety and rate it at 3/10 prior to d/c.  Peri Maris, [place]    (Patient's behavior demonstrates anxiety and he/she is utilizing learned coping skills to deal with anxiety-producing situations)  Outcome: Progressing Pt stated her anxiety felt like it decreased a little, and pt was not feeling as nervous about facing her family .

## 2014-06-26 NOTE — Progress Notes (Signed)
[facility] MD Progress Note  06/26/2014 3:09 PM Melissa Wolfe  MRN:  [id number] Subjective:  Melissa Wolfe is having a hard time. "I want to feel as good as I did when I was in my 64 years old.    Objective:  Patient states that her health has declined.  "I'm more moody and I'm fighting with the people that I love."  Patient is asking if she may truly have bipolar mood disorder.  She is still dealing with the anticipation of having to put his dog to sleep. She is very tearful she is not sleeping well and is overall expressing a sense of helplessness.   Her daughter wanted [name] to know that she has evidenced a lot of mood swings and her daugther wants [name] to consider that she might "be bipolar". [name] would still like to go to a rehab program as not sure if she can make it otherwise.  Principal Problem: Alcohol dependence Diagnosis:   Patient Active Problem List   Diagnosis Date Noted  . Alcohol dependence [F10.20] 06/23/2014  . Depression, major, recurrent, moderate [F33.1] 06/23/2014  . Alcohol abuse with alcohol-induced disorder [F10.19] 06/22/2014   Total Time spent with patient: 30 minutes   Past Medical History:  Past Medical History  Diagnosis Date  . Hypothyroid   . Hypertension   . Hyperlipemia   . Depression   . Alcoholism     Past Surgical History  Procedure Laterality Date  . Mastectomy    . Oophorectomy Right   . Fallopian ablation    . Myomectomy     Family History: History reviewed. No pertinent family history. Social History:  History  Alcohol Use  . Yes    Comment: 12 oz vodka daily minimum     History  Drug Use No    History   Social History  . Marital Status: Legally Separated    Spouse Name: N/A  . Number of Children: N/A  . Years of Education: N/A   Social History Main Topics  . Smoking status: Current Some Day Smoker -- 0.10 packs/day    Last Attempt to Quit: 02/28/2010  . Smokeless tobacco: Not on file  . Alcohol Use: Yes     Comment: 12 oz  vodka daily minimum  . Drug Use: No  . Sexual Activity: Not on file   Other Topics Concern  . None   Social History Narrative   Additional History:    Sleep: Poor  Appetite:  Fair   Assessment:   Musculoskeletal: Strength & Muscle Tone: within normal limits Gait & Station: normal Patient leans: normal   Psychiatric Specialty Exam: Physical Exam  Review of Systems  Constitutional: Negative.   HENT: Negative.   Eyes: Negative.   Respiratory: Negative.   Cardiovascular: Negative.   Gastrointestinal: Negative.   Genitourinary: Negative.   Musculoskeletal: Negative.   Skin: Negative.   Neurological: Negative.   Endo/Heme/Allergies: Negative.   Psychiatric/Behavioral: Positive for depression and substance abuse. The patient is nervous/anxious and has insomnia.     Blood pressure 115/65, pulse 105, temperature 97.7 F (36.5 C), temperature source Oral, resp. rate 16, height 5' 0.24" (1.53 m), weight 83.008 kg (183 lb).Body mass index is 35.46 kg/(m^2).  General Appearance: Fairly Groomed  Engineer, water::  Fair  Speech:  Clear and Coherent  Volume:  Decreased  Mood:  Anxious and Depressed  Affect:  Depressed and Tearful  Thought Process:  Coherent and Goal Directed  Orientation:  Full (Time, Place, and Person)  Thought Content:  symptoms events worries concerns  Suicidal Thoughts:  not right now  Homicidal Thoughts:  No  Memory:  Immediate;   Fair Recent;   Fair Remote;   Fair  Judgement:  Fair  Insight:  Present  Psychomotor Activity:  Restlessness  Concentration:  Fair  Recall:  [employer] of Knowledge:Fair  Language: Fair  Akathisia:  No  Handed:  Right  AIMS (if indicated):     Assets:  Desire for Improvement  ADL's:  Intact  Cognition: WNL  Sleep:  Number of Hours: 6     Current Medications: Current Facility-Administered Medications  Medication Dose Route Frequency Provider Last Rate Last Dose  . acamprosate (CAMPRAL) tablet 666 mg  666 mg Oral  TID WC Nicholaus Bloom, MD   666 mg at 06/26/14 1209  . acetaminophen (TYLENOL) tablet 650 mg  650 mg Oral Q6H PRN Laverle Hobby, PA-C      . albuterol (PROVENTIL HFA;VENTOLIN HFA) 108 (90 BASE) MCG/ACT inhaler 1-2 puff  1-2 puff Inhalation Q6H PRN Laverle Hobby, PA-C      . alum & mag hydroxide-simeth (MAALOX/MYLANTA) 200-200-20 MG/5ML suspension 30 mL  30 mL Oral Q4H PRN Laverle Hobby, PA-C   30 mL at 06/26/14 0831  . atorvastatin (LIPITOR) tablet 20 mg  20 mg Oral Daily Laverle Hobby, PA-C   20 mg at 06/26/14 0827  . buPROPion (WELLBUTRIN XL) 24 hr tablet 150 mg  150 mg Oral Daily Nicholaus Bloom, MD   150 mg at 06/26/14 0827  . irbesartan (AVAPRO) tablet 150 mg  150 mg Oral BID Laverle Hobby, PA-C   150 mg at 06/26/14 0827  . levothyroxine (SYNTHROID, LEVOTHROID) tablet 137 mcg  137 mcg Oral QAC breakfast Laverle Hobby, PA-C   137 mcg at 06/26/14 6378  . LORazepam (ATIVAN) tablet 1 mg  1 mg Oral BID Nicholaus Bloom, MD   1 mg at 06/26/14 5885   Followed by  . [START ON 06/27/2014] LORazepam (ATIVAN) tablet 1 mg  1 mg Oral Daily Nicholaus Bloom, MD      . magnesium hydroxide (MILK OF MAGNESIA) suspension 30 mL  30 mL Oral Daily PRN Laverle Hobby, PA-C      . multivitamin with minerals tablet 1 tablet  1 tablet Oral Daily Laverle Hobby, PA-C   1 tablet at 06/26/14 0827  . nicotine (NICODERM CQ - dosed in mg/24 hours) patch 14 mg  14 mg Transdermal Daily Nicholaus Bloom, MD   14 mg at 06/26/14 0277  . ramelteon (ROZEREM) tablet 8 mg  8 mg Oral QHS Nicholaus Bloom, MD   8 mg at 06/25/14 2213  . thiamine (B-1) injection 100 mg  100 mg Intramuscular Once Nicholaus Bloom, MD   100 mg at 06/23/14 1601  . thiamine (VITAMIN B-1) tablet 100 mg  100 mg Oral Daily Laverle Hobby, PA-C   100 mg at 06/26/14 4128    Lab Results: No results found for this or any previous visit (from the past 61 hour(s)).  Physical Findings: AIMS: Facial and Oral Movements Muscles of Facial Expression: None, normal Lips  and Perioral Area: None, normal Jaw: None, normal Tongue: None, normal,Extremity Movements Upper (arms, wrists, hands, fingers): None, normal Lower (legs, knees, ankles, toes): None, normal, Trunk Movements Neck, shoulders, hips: None, normal, Overall Severity Severity of abnormal movements (highest score from questions above): None, normal Incapacitation due to abnormal movements: None, normal  Patient's awareness of abnormal movements (rate only patient's report): No Awareness, Dental Status Current problems with teeth and/or dentures?: Yes Does patient usually wear dentures?: No  CIWA:  CIWA-Ar Total: 0 COWS:     Treatment Plan Summary: Daily contact with patient to assess and evaluate symptoms and progress in treatment and Medication management Supportive approach/coping skills Alcohol dependence; continue to detox ( she is still having increased BP and pulse) Depression; continue to work with the Wellbutrin  Mood instability; will reassess for bipolarity as we detox ( hard to diagnosed bipolarity in the presence of alcohol use) R/O substance induced versus true comorbidity Work a relapse prevention plan Explore reisidential treatment options  Medical Decision Making:  Review of Psycho-Social Stressors (1), Review of Medication Regimen & Side Effects (2) and Review of New Medication or Change in Dosage (2)  [place] AGNP-BC 06/26/2014, 3:09 PM

## 2014-06-26 NOTE — Progress Notes (Signed)
D: Pt denies SI/HI/AVH. Pt is pleasant and cooperative. Pt very sad and feels bad about haw she acted before coming in to [facility]. Pt really concerned that her Grandson views her in a different way since the incident. Pt really concerned because she wants to get home so she can put her dog to sleep. Pt wants LT facility and wants help with her ETOH abuse and depression.   A: Pt was offered support and encouragement. Pt was given scheduled medications. Pt was encourage to attend groups. Q 15 minute checks were done for safety.   R:Pt  interacts well with peers and staff. Pt is taking medication. Pt has no complaints at this time .Pt receptive to treatment and safety maintained on unit.

## 2014-06-26 NOTE — Progress Notes (Addendum)
Patient ID: Melissa Wolfe, female   DOB: 02-23-1950, 64 y.o.   MRN: [id number]  Pt currently presents with a pleasant affect and euthymic behavior. Per self inventory, pt rates depression at a 3, hopelessness 2 and anxiety 0-2. Pt's daily goal is to "planning for one day home to euthanize dog or go for out pt therapy and psych f/u." Pt reports poor sleep, good concentration, normal energy and a good appetite.   Pt provided with medications per providers orders. Pt's labs and vitals were monitored throughout the day. Pt supported emotionally and encouraged to express concerns and questions. Pt educated on medications.  Pt major complaint is lack of sleep from night prior. Pt states she did not sleep because she would "frequently wake up." Pt attends groups this afternoon and is a positive part of the milieu. Pt's safety ensured with 15 minute and environmental checks. Pt currently denies SI/HI and A/V hallucinations. Pt verbally agrees to seek staff if SI/HI or A/VH occurs and to consult with staff before acting on these thoughts. Will continue POC.

## 2014-06-27 ENCOUNTER — Encounter (HOSPITAL_COMMUNITY): Payer: Self-pay | Admitting: Nurse Practitioner

## 2014-06-27 DIAGNOSIS — F1020 Alcohol dependence, uncomplicated: Secondary | ICD-10-CM | POA: Insufficient documentation

## 2014-06-27 MED ORDER — AMITRIPTYLINE HCL 10 MG PO TABS
10.0000 mg | ORAL_TABLET | Freq: Every evening | ORAL | Status: DC | PRN
  Administered 2014-06-27 – 2014-06-28 (×2): 10 mg via ORAL
  Filled 2014-06-27 (×2): qty 1
  Filled 2014-06-27: qty 28
  Filled 2014-06-27: qty 1
  Filled 2014-06-27: qty 28
  Filled 2014-06-27: qty 1

## 2014-06-27 MED ORDER — BUPROPION HCL ER (XL) 300 MG PO TB24
300.0000 mg | ORAL_TABLET | Freq: Every day | ORAL | Status: DC
  Administered 2014-06-28: 300 mg via ORAL
  Filled 2014-06-27 (×2): qty 1
  Filled 2014-06-27: qty 14

## 2014-06-27 NOTE — Progress Notes (Signed)
Psychoeducational Group Note  Date:  06/27/2014 Time:  0012  Group Topic/Focus:  Wrap-Up Group:   The focus of this group is to help patients review their daily goal of treatment and discuss progress on daily workbooks.  Participation Level: Did Not Attend  Participation Quality:  Not Applicable  Affect:  Not Applicable  Cognitive:  Not Applicable  Insight:  Not Applicable  Engagement in Group: Not Applicable  Additional Comments:  The patient did not attend the A.A. Meeting and chose to stand by her room and speak with one of her female peers.   Shanora Christensen S 06/27/2014, 12:12 AM

## 2014-06-27 NOTE — [facility] Group Notes (Signed)
[facility] Group Notes:  (Clinical Social Work)  06/27/2014  10:00-11:00AM  Summary of Progress/Problems:   The main focus of today's process group was to   1)  discuss the importance of adding supports  2)  define health supports versus unhealthy supports  3)  identify the patient's current unhealthy supports and plan how to handle them  4)  Identify the patient's current healthy supports and plan what to add.  An emphasis was placed on using counselor, doctor, therapy groups, 12-step groups, and problem-specific support groups to expand supports.    The patient expressed full comprehension of the concepts presented, and agreed that there is a need to add more supports.  The patient stated that she wants someone such as a sponsor to literally show her what to do and states she will do it.  She does not want to relapse and spoke about her commitment to following through on all necessary steps toward sobriety achievement and maintenance.  Type of Therapy:  Process Group with Motivational Interviewing  Participation Level:  Active  Participation Quality:  Attentive and Sharing  Affect:  Blunted and Depressed  Cognitive:  Alert, Appropriate and Oriented  Insight:  Developing/Improving  Engagement in Therapy:  Engaged  Modes of Intervention:   Education, Support and Processing, Activity  Selmer Dominion, LCSW 06/27/2014

## 2014-06-27 NOTE — Progress Notes (Signed)
D: Pt denies SI/HI/AV. Pt is pleasant and cooperative. Pt rates depression at a 3, anxiety at a 2, and Helplessness/hopelessness at a 0.  A: Pt was offered support and encouragement. Pt was given scheduled medications. Pt was encourage to attend groups. Q 15 minute checks were done for safety.  R:Pt attends groups and interacts well with peers and staff. Pt taking medication. Pt has no complaints.Pt receptive to treatment and safety maintained on unit.

## 2014-06-27 NOTE — Progress Notes (Signed)
D.  Pt pleasant on approach, no complaints voiced.  Pt spent a great deal of time speaking appropriately with peer and being supportive this evening.  Denies SI/HI/hallucinations at this time.  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  A.  Support and encouragement offered  R.  Pt remains safe on unit, will continue to monitor.

## 2014-06-27 NOTE — Progress Notes (Signed)
[facility] MD Progress Note  06/27/2014 11:49 AM Melissa Wolfe  MRN:  [id number] Subjective:  "I'm still having broken sleep.  Could the Effexor, because it was stopped be causing a feeling of out of body.  I had felt that last night."    Objective:  Patient states that her health has declined.  "I'm more moody and I'm fighting with the people that I love."  Patient is asking if she may truly have bipolar mood disorder.  She is still dealing with the anticipation of having to put his dog to sleep. She is very tearful she is not sleeping well and is overall expressing a sense of helplessness.   Her daughter wanted [name] to know that she has evidenced a lot of mood swings and her daugther wants [name] to consider that she might "be bipolar". Carry would still like to go to a rehab program as not sure if she can make it otherwise.  Principal Problem: Alcohol dependence Diagnosis:   Patient Active Problem List   Diagnosis Date Noted  . Alcohol dependence [F10.20] 06/23/2014  . Depression, major, recurrent, moderate [F33.1] 06/23/2014  . Alcohol abuse with alcohol-induced disorder [F10.19] 06/22/2014   Total Time spent with patient: 30 minutes   Past Medical History:  Past Medical History  Diagnosis Date  . Hypothyroid   . Hypertension   . Hyperlipemia   . Depression   . Alcoholism     Past Surgical History  Procedure Laterality Date  . Mastectomy    . Oophorectomy Right   . Fallopian ablation    . Myomectomy     Family History: History reviewed. No pertinent family history. Social History:  History  Alcohol Use  . Yes    Comment: 12 oz vodka daily minimum     History  Drug Use No    History   Social History  . Marital Status: Legally Separated    Spouse Name: N/A  . Number of Children: N/A  . Years of Education: N/A   Social History Main Topics  . Smoking status: Current Some Day Smoker -- 0.10 packs/day    Last Attempt to Quit: 02/28/2010  . Smokeless tobacco: Not  on file  . Alcohol Use: Yes     Comment: 12 oz vodka daily minimum  . Drug Use: No  . Sexual Activity: Not on file   Other Topics Concern  . None   Social History Narrative   Additional History:    Sleep: Poor  Appetite:  Fair   Assessment:   Musculoskeletal: Strength & Muscle Tone: within normal limits Gait & Station: normal Patient leans: normal   Psychiatric Specialty Exam: Physical Exam  Review of Systems  Constitutional: Negative.   HENT: Negative.   Eyes: Negative.   Respiratory: Negative.   Cardiovascular: Negative.   Gastrointestinal: Negative.   Genitourinary: Negative.   Musculoskeletal: Negative.   Skin: Negative.   Neurological: Negative.   Endo/Heme/Allergies: Negative.   Psychiatric/Behavioral: Positive for depression and substance abuse. The patient is nervous/anxious and has insomnia.     Blood pressure 144/87, pulse 99, temperature 97.9 F (36.6 C), temperature source Oral, resp. rate 16, height 5' 0.24" (1.53 m), weight 83.008 kg (183 lb).Body mass index is 35.46 kg/(m^2).  General Appearance: Fairly Groomed  Engineer, water::  Fair  Speech:  Clear and Coherent  Volume:  Decreased  Mood:  Anxious and Depressed  Affect:  Depressed and Tearful  Thought Process:  Coherent and Goal Directed  Orientation:  Full (  Time, Place, and Person)  Thought Content:  symptoms events worries concerns  Suicidal Thoughts:  not right now  Homicidal Thoughts:  No  Memory:  Immediate;   Fair Recent;   Fair Remote;   Fair  Judgement:  Fair  Insight:  Present  Psychomotor Activity:  Restlessness  Concentration:  Fair  Recall:  [employer] of Knowledge:Fair  Language: Fair  Akathisia:  No  Handed:  Right  AIMS (if indicated):     Assets:  Desire for Improvement  ADL's:  Intact  Cognition: WNL  Sleep:  Number of Hours: 5.75     Current Medications: Current Facility-Administered Medications  Medication Dose Route Frequency Provider Last Rate Last Dose  .  acamprosate (CAMPRAL) tablet 666 mg  666 mg Oral TID WC Nicholaus Bloom, MD   666 mg at 06/27/14 0551  . acetaminophen (TYLENOL) tablet 650 mg  650 mg Oral Q6H PRN Laverle Hobby, PA-C      . albuterol (PROVENTIL HFA;VENTOLIN HFA) 108 (90 BASE) MCG/ACT inhaler 1-2 puff  1-2 puff Inhalation Q6H PRN Laverle Hobby, PA-C      . alum & mag hydroxide-simeth (MAALOX/MYLANTA) 200-200-20 MG/5ML suspension 30 mL  30 mL Oral Q4H PRN Laverle Hobby, PA-C   30 mL at 06/26/14 0831  . amitriptyline (ELAVIL) tablet 10 mg  10 mg Oral QHS,MR X 1 Kerrie Buffalo, NP      . atorvastatin (LIPITOR) tablet 20 mg  20 mg Oral Daily Laverle Hobby, PA-C   20 mg at 06/27/14 0734  . [START ON 06/28/2014] buPROPion (WELLBUTRIN XL) 24 hr tablet 300 mg  300 mg Oral Daily Kerrie Buffalo, NP      . irbesartan (AVAPRO) tablet 150 mg  150 mg Oral BID Laverle Hobby, PA-C   150 mg at 06/27/14 0734  . levothyroxine (SYNTHROID, LEVOTHROID) tablet 137 mcg  137 mcg Oral QAC breakfast Laverle Hobby, PA-C   137 mcg at 06/27/14 0551  . magnesium hydroxide (MILK OF MAGNESIA) suspension 30 mL  30 mL Oral Daily PRN Laverle Hobby, PA-C      . multivitamin with minerals tablet 1 tablet  1 tablet Oral Daily Laverle Hobby, PA-C   1 tablet at 06/27/14 0734  . nicotine (NICODERM CQ - dosed in mg/24 hours) patch 14 mg  14 mg Transdermal Daily Nicholaus Bloom, MD   14 mg at 06/26/14 [phone]  . ramelteon (ROZEREM) tablet 8 mg  8 mg Oral QHS Nicholaus Bloom, MD   8 mg at 06/26/14 2104  . thiamine (B-1) injection 100 mg  100 mg Intramuscular Once Nicholaus Bloom, MD   100 mg at 06/23/14 1601  . thiamine (VITAMIN B-1) tablet 100 mg  100 mg Oral Daily Laverle Hobby, PA-C   100 mg at 06/27/14 4008    Lab Results: No results found for this or any previous visit (from the past 48 hour(s)).  Physical Findings: AIMS: Facial and Oral Movements Muscles of Facial Expression: None, normal Lips and Perioral Area: None, normal Jaw: None, normal Tongue: None,  normal,Extremity Movements Upper (arms, wrists, hands, fingers): None, normal Lower (legs, knees, ankles, toes): None, normal, Trunk Movements Neck, shoulders, hips: None, normal, Overall Severity Severity of abnormal movements (highest score from questions above): None, normal Incapacitation due to abnormal movements: None, normal Patient's awareness of abnormal movements (rate only patient's report): No Awareness, Dental Status Current problems with teeth and/or dentures?: Yes Does patient usually wear dentures?:  No  CIWA:  CIWA-Ar Total: 0 COWS:     Treatment Plan Summary: Daily contact with patient to assess and evaluate symptoms and progress in treatment and Medication management Supportive approach/coping skills Alcohol dependence; continue to detox ( she is still having increased BP and pulse) Depression; continue to work with the Wellbutrin  Mood instability; will reassess for bipolarity as we detox ( hard to diagnosed bipolarity in the presence of alcohol use) R/O substance induced versus true comorbidity Work a relapse prevention plan Explore reisidential treatment options Discussed with De Nurse D that adding Elavil could help with the insomnia.  Added Elavil 10 mg and could repeat one more time if first dose is ineffective.  Medical Decision Making:  Review of Psycho-Social Stressors (1), Review of Medication Regimen & Side Effects (2) and Review of New Medication or Change in Dosage (2)  Cicero AGNP-BC 06/27/2014, 11:49 AM

## 2014-06-27 NOTE — Progress Notes (Signed)
Patient did attend the evening speaker AA meeting.  

## 2014-06-28 DIAGNOSIS — F10230 Alcohol dependence with withdrawal, uncomplicated: Secondary | ICD-10-CM | POA: Insufficient documentation

## 2014-06-28 MED ORDER — ATORVASTATIN CALCIUM 20 MG PO TABS: 20.0000 mg | ORAL_TABLET | Freq: Every day | ORAL | Status: AC

## 2014-06-28 MED ORDER — ACAMPROSATE CALCIUM 333 MG PO TBEC: 666.0000 mg | DELAYED_RELEASE_TABLET | Freq: Three times a day (TID) | ORAL | Status: DC

## 2014-06-28 MED ORDER — NICOTINE 14 MG/24HR TD PT24: 14.0000 mg | MEDICATED_PATCH | Freq: Every day | TRANSDERMAL | Status: DC

## 2014-06-28 MED ORDER — RAMELTEON 8 MG PO TABS: 8.0000 mg | ORAL_TABLET | Freq: Every day | ORAL | Status: DC

## 2014-06-28 MED ORDER — LEVOTHYROXINE SODIUM 137 MCG PO TABS: 137.0000 ug | ORAL_TABLET | Freq: Every day | ORAL | Status: DC

## 2014-06-28 MED ORDER — CLONIDINE HCL 0.1 MG PO TABS: 0.1000 mg | ORAL_TABLET | Freq: Two times a day (BID) | ORAL | Status: DC

## 2014-06-28 MED ORDER — ALBUTEROL SULFATE HFA 108 (90 BASE) MCG/ACT IN AERS: 1.0000 | INHALATION_SPRAY | Freq: Four times a day (QID) | RESPIRATORY_TRACT | Status: DC | PRN

## 2014-06-28 MED ORDER — CLONIDINE HCL 0.2 MG PO TABS
0.2000 mg | ORAL_TABLET | Freq: Once | ORAL | Status: AC
  Administered 2014-06-28: 0.2 mg via ORAL
  Filled 2014-06-28: qty 1
  Filled 2014-06-28: qty 2

## 2014-06-28 MED ORDER — CLONIDINE HCL 0.1 MG PO TABS: 0.1000 mg | ORAL_TABLET | Freq: Two times a day (BID) | ORAL | Status: AC

## 2014-06-28 MED ORDER — AMITRIPTYLINE HCL 10 MG PO TABS: ORAL_TABLET | ORAL | Status: DC

## 2014-06-28 MED ORDER — CLONIDINE HCL 0.1 MG PO TABS
0.1000 mg | ORAL_TABLET | Freq: Two times a day (BID) | ORAL | Status: DC
  Filled 2014-06-28 (×2): qty 28
  Filled 2014-06-28: qty 1

## 2014-06-28 MED ORDER — CLONIDINE HCL 0.2 MG PO TABS: 0.2000 mg | ORAL_TABLET | Freq: Once | ORAL | Status: DC

## 2014-06-28 MED ORDER — VALSARTAN 160 MG PO TABS: 160.0000 mg | ORAL_TABLET | Freq: Two times a day (BID) | ORAL | Status: DC

## 2014-06-28 MED ORDER — AMITRIPTYLINE HCL 10 MG PO TABS
10.0000 mg | ORAL_TABLET | Freq: Every day | ORAL | Status: DC
  Filled 2014-06-28: qty 14

## 2014-06-28 MED ORDER — BUPROPION HCL ER (XL) 300 MG PO TB24: 300.0000 mg | ORAL_TABLET | Freq: Every day | ORAL | Status: DC

## 2014-06-28 NOTE — Progress Notes (Signed)
  [facility] Adult Case Management Discharge Plan :  Will you be returning to the same living situation after discharge:  Yes,  Patient plans to return home At discharge, do you have transportation home?: Yes,  patient reports access to transportation Do you have the ability to pay for your medications: Yes,  patient will be provided with medication samples and prescriptions  Release of information consent forms completed and in the chart;  Patient's signature needed at discharge.  Patient to Follow up at: Follow-up Information    Follow up with [facility] On 07/01/2014.   Why:  Appointment on Thursday June 30th at 9:00am with Federico Flake for a re-entry appointment. Please call if you need to reschedule appointment.   Contact information:   [address], [place] [address] [phone]      Patient denies SI/HI: Yes,  denies    Safety Planning and Suicide Prevention discussed: Yes,  with patient and daughter  Have you used any form of tobacco in the last 30 days? (Cigarettes, Smokeless Tobacco, Cigars, and/or Pipes): Yes  Has patient been referred to the Quitline?: Patient refused referral  Loghan Subia, Casimiro Needle 06/28/2014, 10:24 AM

## 2014-06-28 NOTE — Progress Notes (Signed)
Recreation Therapy Notes  Date: 06.27.16 Time: 9:30 am Location: [address] Dayroom  Group Topic: Stress Management  Goal Area(s) Addresses:  Patient will verbalize importance of using healthy stress management.  Patient will identify positive emotions associated with healthy stress management.   Intervention: Stress Management  Activity :  Guided Automotive engineer.  LRT introduced and educated patients on stress management technique of guided imagery.  A script was used to to deliver the technique to patients.  Patients were asked to follow script read aloud by LRT to engage in the stress management technique.  Education:  Stress Management, Discharge Planning.   Clinical Observations/Feedback: Patient did not attend group.  Victorino Sparrow , LRT/CTRS         Victorino Sparrow A 06/28/2014 5:05 PM

## 2014-06-28 NOTE — Progress Notes (Signed)
Writer has observed patient up in the dayroom, earlier this evening visiting with her daughter and grandson. She has been interacting appropriately with peers, attended AA group tonight and compliant with her scheduled medications. She reports that she can not drink anymore and wants a treatment facility to go to once discharged. She denies si/hi/a/v hallucinations. Support and encouragement given, safety maintained on unit with 15 min checks.

## 2014-06-28 NOTE — Discharge Summary (Signed)
Physician Discharge Summary Note  Patient:  Melissa Wolfe is an 64 y.o., female MRN:  [id number] DOB:  02-27-1950 Patient phone:  [phone] (home)  Patient address:   [address] [place] [address],  Total Time spent with patient: Greater than 35 minutes  Date of Admission:  06/22/2014  Date of Discharge: 06-28-14  Reason for Admission: Alcohol intoxication/worsening symptoms of depression  Principal Problem: Alcohol dependence  Discharge Diagnoses: Patient Active Problem List   Diagnosis Date Noted  . Alcohol dependence with uncomplicated withdrawal [O13.086]   . Uncomplicated alcohol dependence [F10.20]   . Alcohol dependence [F10.20] 06/23/2014  . Depression, major, recurrent, moderate [F33.1] 06/23/2014  . Alcohol abuse with alcohol-induced disorder [F10.19] 06/22/2014   Musculoskeletal: Strength & Muscle Tone: within normal limits Gait & Station: normal Patient leans: N/A  Psychiatric Specialty Exam: Physical Exam  Psychiatric: Her speech is normal and behavior is normal. Judgment and thought content normal. Her mood appears not anxious. Her affect is not angry, not blunt, not labile and not inappropriate. Cognition and memory are normal. She does not exhibit a depressed mood.    Review of Systems  Constitutional: Negative.   HENT: Negative.   Eyes: Negative.   Respiratory: Negative.   Cardiovascular: Negative.   Gastrointestinal: Negative.   Genitourinary: Negative.   Musculoskeletal: Negative.   Skin: Negative.   Neurological: Negative.   Endo/Heme/Allergies: Negative.   Psychiatric/Behavioral: Positive for depression (Stable) and substance abuse (Alcoholism, chronic). Negative for suicidal ideas, hallucinations and memory loss. The patient has insomnia (Stable). The patient is not nervous/anxious.     Blood pressure 125/83, pulse 90, temperature 97.9 F (36.6 C), temperature source Oral, resp. rate 24, height 5' 0.24" (1.53 m), weight  83.008 kg (183 lb).Body mass index is 35.46 kg/(m^2).  See Md's SRA  Have you used any form of tobacco in the last 30 days? (Cigarettes, Smokeless Tobacco, Cigars, and/or Pipes): Yes  Has this patient used any form of tobacco in the last 30 days? (Cigarettes, Smokeless Tobacco, Cigars, and/or Pipes): Yes, Prescription not provided because: nicontine patches will be given  Past Medical History:  Past Medical History  Diagnosis Date  . Hypothyroid   . Hypertension   . Hyperlipemia   . Depression   . Alcoholism     Past Surgical History  Procedure Laterality Date  . Mastectomy    . Oophorectomy Right   . Fallopian ablation    . Myomectomy     Family History: History reviewed. No pertinent family history.  Social History:  History  Alcohol Use  . Yes    Comment: 12 oz vodka daily minimum     History  Drug Use No    History   Social History  . Marital Status: Legally Separated    Spouse Name: N/A  . Number of Children: N/A  . Years of Education: N/A   Social History Main Topics  . Smoking status: Current Some Day Smoker -- 0.10 packs/day    Last Attempt to Quit: 02/28/2010  . Smokeless tobacco: Not on file  . Alcohol Use: Yes     Comment: 12 oz vodka daily minimum  . Drug Use: No  . Sexual Activity: Not on file   Other Topics Concern  . None   Social History Narrative   Risk to Self: Is patient at risk for suicide?: No What has been your use of drugs/alcohol within the last 12 months?: daily use of vodka, amount uknown; using daily for  the past 6 months Risk to Others: No Prior Inpatient Therapy: Yes Prior Outpatient Therapy: Yes  Level of Care:  OP  Hospital Course:  64 Y/O female who states that she has increased her alcohol intake during the last 6 months. Drinks Vodka, she "pours, there is no amount". Drinking every day. Sates she was fired from her job. 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 It had happened once before States that she  went ahead and retired. States since she was fired she drinks more. Goes to bed at 3 AM sleeps until she has to get her grandson. From [place] came back here. Husband from whom she is separated came following her. They are separated he has prostate cancer and she does not think she can not be there for him. She was diagnosed with hypothyroidism years ago and looking back she thinks this is when all really began.  Jessalynn was admitted to the hospital for alcohol detoxification treatment. Her blood alcohol level upon admission was <5 per toxicology tests reports. However, she reported having been drinking a lot a lot of alcohol. In order to prevent alcohol withdrawal related seizures, Palin received alcohol detoxification treatment using Ativan detox protocols. By using ativan detox regimen, Joycelyn Schmid received a cleaner detox treatment without the lingering adverse effects of the long acting Librium capsules.  Besides the detox treatment, Maragaret also was medicated and discharged on; CAMPRAL 333 mg for alcoholism, Elavil 10 mg for insomnia, Wellbutrin XL 300 mg for depression, Nicotine patch 14 mg for nicotine addiction & Rozerem 8 mg for insomnia. She also received other medication management for her other medical issues that she presented. She tolerated her treatment regimen without any significant adverse effects and or reactions. Ryanne participated in the [facility] meetings and group counseling sessions being offered and held on this unit. She learned coping skills.  Malori has completed detox treatment. She is being discharged to her home to continue treatment on an outpatient basis as noted below. She has been given all the necessary information needed to make this appointment without problems. Upon discharge, she denies any SIHI, AVH, delusional thoughts, paranoia and or substance withdrawal symptoms. She received some samples of her discharge medicines. She left [facility] with all personal belongings in  no distress. Transportation per patient's arrangement.   Consults:  psychiatry   Consults:  psychiatry  Significant Diagnostic Studies:  labs: CBC with diff, CMP, UDS, toxicology tests, U/A, results reviewed, stable  Discharge Vitals:   Blood pressure 125/83, pulse 90, temperature 97.9 F (36.6 C), temperature source Oral, resp. rate 24, height 5' 0.24" (1.53 m), weight 83.008 kg (183 lb). Body mass index is 35.46 kg/(m^2). Lab Results:   No results found for this or any previous visit (from the past 72 hour(s)).  Physical Findings: AIMS: Facial and Oral Movements Muscles of Facial Expression: None, normal Lips and Perioral Area: None, normal Jaw: None, normal Tongue: None, normal,Extremity Movements Upper (arms, wrists, hands, fingers): None, normal Lower (legs, knees, ankles, toes): None, normal, Trunk Movements Neck, shoulders, hips: None, normal, Overall Severity Severity of abnormal movements (highest score from questions above): None, normal Incapacitation due to abnormal movements: None, normal Patient's awareness of abnormal movements (rate only patient's report): No Awareness, Dental Status Current problems with teeth and/or dentures?: Yes Does patient usually wear dentures?: No  CIWA:  CIWA-Ar Total: 0 COWS:     See Psychiatric Specialty Exam and Suicide Risk Assessment completed by Attending Physician prior  to discharge.  Discharge destination:  Home  Is patient on multiple antipsychotic therapies at discharge:  No   Has Patient had three or more failed trials of antipsychotic monotherapy by history:  No  Recommended Plan for Multiple Antipsychotic Therapies: NA    Medication List    STOP taking these medications        hydrochlorothiazide 25 MG tablet  Commonly known as:  HYDRODIURIL     venlafaxine XR 37.5 MG 24 hr capsule  Commonly known as:  EFFEXOR-XR      TAKE these medications      Indication   acamprosate 333 MG tablet  Commonly known as:   CAMPRAL  Take 2 tablets (666 mg total) by mouth 3 (three) times daily with meals. For alcoholism   Indication:  Excessive Use of Alcohol     albuterol 108 (90 BASE) MCG/ACT inhaler  Commonly known as:  PROVENTIL HFA;VENTOLIN HFA  Inhale 1-2 puffs into the lungs every 6 (six) hours as needed for wheezing or shortness of breath.   Indication:  Asthma     amitriptyline 10 MG tablet  Commonly known as:  ELAVIL  Take 1 tablet (10 mg) at bedtime for insomnia   Indication:  Trouble Sleeping     atorvastatin 20 MG tablet  Commonly known as:  LIPITOR  Take 1 tablet (20 mg total) by mouth daily. For high cholesterol   Indication:  Inherited Homozygous Hypercholesterolemia     buPROPion 300 MG 24 hr tablet  Commonly known as:  WELLBUTRIN XL  Take 1 tablet (300 mg total) by mouth daily. For depression   Indication:  Major Depressive Disorder     cloNIDine 0.1 MG tablet  Commonly known as:  CATAPRES  Take 1 tablet (0.1 mg total) by mouth 2 (two) times daily. For high blood pressure  Start taking on:  06/29/2014   Indication:  High Blood Pressure     levothyroxine 137 MCG tablet  Commonly known as:  SYNTHROID, LEVOTHROID  Take 1 tablet (137 mcg total) by mouth daily before breakfast. For low thyroid function   Indication:  Underactive Thyroid     nicotine 14 mg/24hr patch  Commonly known as:  NICODERM CQ - dosed in mg/24 hours  Place 1 patch (14 mg total) onto the skin daily. For nicotine addiction   Indication:  Nicotine Addiction     ramelteon 8 MG tablet  Commonly known as:  ROZEREM  Take 1 tablet (8 mg total) by mouth at bedtime. For sleep   Indication:  Trouble Sleeping     valsartan 160 MG tablet  Commonly known as:  DIOVAN  Take 1 tablet (160 mg total) by mouth 2 (two) times daily. For high blood pressure   Indication:  High Blood Pressure       Follow-up Information    Follow up with [facility] [place] On 07/01/2014.   Why:  Appointment on Thursday June 30th at 9:00am with  Federico Flake for a re-entry appointment. Please call if you need to reschedule appointment.   Contact information:   [address], [place] [address] [phone]     Follow-up recommendations: Activity:  As tolerated Diet: As recommended by your primary care doctor. Keep all scheduled follow-up appointments as recommended.   Comments: Take all your medications as prescribed by your mental healthcare provider. Report any adverse effects and or reactions from your medicines to your outpatient provider promptly. Patient is instructed and cautioned to not engage in alcohol  and or illegal drug use while on prescription medicines. In the event of worsening symptoms, patient is instructed to call the crisis hotline, 911 and or go to the nearest ED for appropriate evaluation and treatment of symptoms. Follow-up with your primary care provider for your other medical issues, concerns and or health care needs.   Total Discharge Time: Greater than 30 minutes  Signed: Encarnacion Slates, PMHNP, FNP-BC 06/28/2014, 4:53 PM  I personally assessed the patient and formulated the plan Geralyn Flash A. Sabra Heck, M.D.

## 2014-06-28 NOTE — BHH Group Notes (Signed)
   [facility] LCSW Aftercare Discharge Planning Group Note  06/28/2014  8:45 AM   Participation Quality: Alert, Appropriate and Oriented  Mood/Affect: Appropriate  Depression Rating: 1  Anxiety Rating: 1  Thoughts of Suicide: Pt denies SI/HI  Will you contract for safety? Yes  Current AVH: Pt denies  Plan for Discharge/Comments: Pt attended discharge planning group and actively participated in group. CSW provided pt with today's workbook. Patient plans to return home to follow up with [facility] in [place] for outpatient services.  Transportation Means: Pt reports access to transportation  Supports: No supports mentioned at this time  Tilden Fossa, MSW, [place] Social Worker [employer] [phone]

## 2014-06-28 NOTE — Progress Notes (Signed)
Discharge note:  Patient discharged home per MD order.  Patient received all personal belongings, prescriptions and medication samples.  She denies SI/HI/AVH.  Reviewed discharge instructions and medication administration and patient indicated understanding.  Patient plans to attend a meeting after discharge.  She left ambulatory with her daughter.

## 2014-06-28 NOTE — BHH Suicide Risk Assessment (Signed)
[facility] Discharge Suicide Risk Assessment   Demographic Factors:  Unemployed  Total Time spent with patient: 30 minutes  Musculoskeletal: Strength & Muscle Tone: within normal limits Gait & Station: normal Patient leans: normally  Psychiatric Specialty Exam: Physical Exam  ROS  Blood pressure 174/109, pulse 89, temperature 97.9 F (36.6 C), temperature source Oral, resp. rate 24, height 5' 0.24" (1.53 m), weight 83.008 kg (183 lb).Body mass index is 35.46 kg/(m^2).  General Appearance: Fairly Groomed  Engineer, water::  Fair  Speech:  Clear and [id number]  Volume:  Normal  Mood:  Euthymic  Affect:  Appropriate  Thought Process:  Coherent and Goal Directed  Orientation:  Full (Time, Place, and Person)  Thought Content:  plans as she moves on, relapse prevention plan  Suicidal Thoughts:  No  Homicidal Thoughts:  No  Memory:  Immediate;   Fair Recent;   Fair Remote;   Fair  Judgement:  Fair  Insight:  Present  Psychomotor Activity:  Normal  Concentration:  Fair  Recall:  [employer]  Language: Fair  Akathisia:  No  Handed:  Right  AIMS (if indicated):     Assets:  Desire for Improvement Housing Social Support  Sleep:  Number of Hours: 5.75  Cognition: WNL  ADL's:  Intact   Have you used any form of tobacco in the last 30 days? (Cigarettes, Smokeless Tobacco, Cigars, and/or Pipes): Yes  Has this patient used any form of tobacco in the last 30 days? (Cigarettes, Smokeless Tobacco, Cigars, and/or Pipes) Yes, Prescription not provided because: nicontine patches will be given  Mental Status Per Nursing Assessment::   On Admission:     Current Mental Status by Physician: IN full contact with reality. There are no active S/S of withdrawal. There are no active SI plans or intent. She states she will not go back to drinking. Admits that she feels the  mood swings that her daughter thinks are secondary to her having Bipolar are really secondary to her alcohol use.  She is willing to continue to abstain in order to have a better assessment of her mood   Loss Factors: NA  Historical Factors: NA  Risk Reduction Factors:   Sense of responsibility to family, Living with another person, especially a relative and Positive social support  Continued Clinical Symptoms:  Depression:   Comorbid alcohol abuse/dependence Alcohol/Substance Abuse/Dependencies  Cognitive Features That Contribute To Risk:  None    Suicide Risk:  Minimal: No identifiable suicidal ideation.  Patients presenting with no risk factors but with morbid ruminations; may be classified as minimal risk based on the severity of the depressive symptoms  Principal Problem: Alcohol dependence Discharge Diagnoses:  Patient Active Problem List   Diagnosis Date Noted  . Uncomplicated alcohol dependence [F10.20]   . Alcohol dependence [F10.20] 06/23/2014  . Depression, major, recurrent, moderate [F33.1] 06/23/2014  . Alcohol abuse with alcohol-induced disorder [F10.19] 06/22/2014    Follow-up Information    Follow up with [facility] [place] On 07/01/2014.   Why:  Appointment on Thursday June 30th at 9:00am with Federico Flake for a re-entry appointment. Please call if you need to reschedule appointment.   Contact information:   [address], [place] [address] [phone]      Plan Of Care/Follow-up recommendations:  Activity:  as tolerated Diet:  regular Follow up [facility] as above Is patient on multiple antipsychotic therapies at discharge:  No   Has Patient had three or more failed trials of  antipsychotic monotherapy by history:  No  Recommended Plan for Multiple Antipsychotic Therapies: NA    Yolanda Huffstetler A 06/28/2014, 1:11 PM

## 2014-08-02 DIAGNOSIS — Z6834 Body mass index (BMI) 34.0-34.9, adult: Secondary | ICD-10-CM | POA: Diagnosis not present

## 2014-08-02 DIAGNOSIS — F17210 Nicotine dependence, cigarettes, uncomplicated: Secondary | ICD-10-CM | POA: Diagnosis not present

## 2014-08-02 DIAGNOSIS — I10 Essential (primary) hypertension: Secondary | ICD-10-CM | POA: Diagnosis not present

## 2014-08-02 DIAGNOSIS — F1020 Alcohol dependence, uncomplicated: Secondary | ICD-10-CM | POA: Diagnosis not present

## 2014-08-02 DIAGNOSIS — F3341 Major depressive disorder, recurrent, in partial remission: Secondary | ICD-10-CM | POA: Diagnosis not present

## 2014-10-28 DIAGNOSIS — M79645 Pain in left finger(s): Secondary | ICD-10-CM | POA: Diagnosis not present

## 2014-10-28 DIAGNOSIS — M19042 Primary osteoarthritis, left hand: Secondary | ICD-10-CM | POA: Diagnosis not present

## 2014-10-28 DIAGNOSIS — Z6834 Body mass index (BMI) 34.0-34.9, adult: Secondary | ICD-10-CM | POA: Diagnosis not present

## 2014-10-28 DIAGNOSIS — S62232A Other displaced fracture of base of first metacarpal bone, left hand, initial encounter for closed fracture: Secondary | ICD-10-CM | POA: Diagnosis not present

## 2014-10-29 DIAGNOSIS — S62212A Bennett's fracture, left hand, initial encounter for closed fracture: Secondary | ICD-10-CM | POA: Diagnosis not present

## 2014-11-03 DIAGNOSIS — S62212A Bennett's fracture, left hand, initial encounter for closed fracture: Secondary | ICD-10-CM | POA: Diagnosis not present

## 2014-11-03 DIAGNOSIS — Y929 Unspecified place or not applicable: Secondary | ICD-10-CM | POA: Diagnosis not present

## 2014-11-03 DIAGNOSIS — Y999 Unspecified external cause status: Secondary | ICD-10-CM | POA: Diagnosis not present

## 2014-11-11 DIAGNOSIS — I10 Essential (primary) hypertension: Secondary | ICD-10-CM | POA: Diagnosis not present

## 2014-11-11 DIAGNOSIS — R252 Cramp and spasm: Secondary | ICD-10-CM | POA: Diagnosis not present

## 2014-11-11 DIAGNOSIS — F419 Anxiety disorder, unspecified: Secondary | ICD-10-CM | POA: Diagnosis not present

## 2014-11-11 DIAGNOSIS — R195 Other fecal abnormalities: Secondary | ICD-10-CM | POA: Diagnosis not present

## 2014-11-11 DIAGNOSIS — M79645 Pain in left finger(s): Secondary | ICD-10-CM | POA: Diagnosis not present

## 2014-11-11 DIAGNOSIS — E039 Hypothyroidism, unspecified: Secondary | ICD-10-CM | POA: Diagnosis not present

## 2014-11-11 DIAGNOSIS — R739 Hyperglycemia, unspecified: Secondary | ICD-10-CM | POA: Diagnosis not present

## 2014-11-11 DIAGNOSIS — F1320 Sedative, hypnotic or anxiolytic dependence, uncomplicated: Secondary | ICD-10-CM | POA: Diagnosis not present

## 2014-11-15 DIAGNOSIS — S62212D Bennett's fracture, left hand, subsequent encounter for fracture with routine healing: Secondary | ICD-10-CM | POA: Diagnosis not present

## 2014-11-23 DIAGNOSIS — S62212D Bennett's fracture, left hand, subsequent encounter for fracture with routine healing: Secondary | ICD-10-CM | POA: Diagnosis not present

## 2014-12-01 ENCOUNTER — Encounter: Payer: Self-pay | Admitting: Internal Medicine

## 2014-12-02 DIAGNOSIS — S62212D Bennett's fracture, left hand, subsequent encounter for fracture with routine healing: Secondary | ICD-10-CM | POA: Diagnosis not present

## 2014-12-02 DIAGNOSIS — Z4789 Encounter for other orthopedic aftercare: Secondary | ICD-10-CM | POA: Diagnosis not present

## 2014-12-21 DIAGNOSIS — S62212D Bennett's fracture, left hand, subsequent encounter for fracture with routine healing: Secondary | ICD-10-CM | POA: Diagnosis not present

## 2014-12-30 DIAGNOSIS — S62212D Bennett's fracture, left hand, subsequent encounter for fracture with routine healing: Secondary | ICD-10-CM | POA: Diagnosis not present

## 2015-01-14 DIAGNOSIS — S62212D Bennett's fracture, left hand, subsequent encounter for fracture with routine healing: Secondary | ICD-10-CM | POA: Diagnosis not present

## 2015-02-02 ENCOUNTER — Telehealth: Payer: Self-pay | Admitting: *Deleted

## 2015-02-02 NOTE — Telephone Encounter (Signed)
Called pt Melissa Wolfe, missed pre-visit appt, please call back to reschedule-adm

## 2015-02-06 DIAGNOSIS — I10 Essential (primary) hypertension: Secondary | ICD-10-CM | POA: Diagnosis not present

## 2015-02-06 DIAGNOSIS — R350 Frequency of micturition: Secondary | ICD-10-CM | POA: Diagnosis not present

## 2015-02-08 ENCOUNTER — Encounter: Payer: Self-pay | Admitting: Internal Medicine

## 2015-02-14 DIAGNOSIS — Z4789 Encounter for other orthopedic aftercare: Secondary | ICD-10-CM | POA: Diagnosis not present

## 2015-02-14 DIAGNOSIS — S62212D Bennett's fracture, left hand, subsequent encounter for fracture with routine healing: Secondary | ICD-10-CM | POA: Diagnosis not present

## 2015-03-03 DIAGNOSIS — F331 Major depressive disorder, recurrent, moderate: Secondary | ICD-10-CM | POA: Diagnosis not present

## 2015-03-07 DIAGNOSIS — R350 Frequency of micturition: Secondary | ICD-10-CM | POA: Diagnosis not present

## 2015-03-07 DIAGNOSIS — M859 Disorder of bone density and structure, unspecified: Secondary | ICD-10-CM | POA: Diagnosis not present

## 2015-03-07 DIAGNOSIS — R0781 Pleurodynia: Secondary | ICD-10-CM | POA: Diagnosis not present

## 2015-03-07 DIAGNOSIS — I10 Essential (primary) hypertension: Secondary | ICD-10-CM | POA: Diagnosis not present

## 2015-03-07 DIAGNOSIS — Z6834 Body mass index (BMI) 34.0-34.9, adult: Secondary | ICD-10-CM | POA: Diagnosis not present

## 2015-03-07 DIAGNOSIS — Z780 Asymptomatic menopausal state: Secondary | ICD-10-CM | POA: Diagnosis not present

## 2015-03-08 ENCOUNTER — Encounter: Payer: Self-pay | Admitting: Internal Medicine

## 2015-03-11 DIAGNOSIS — F331 Major depressive disorder, recurrent, moderate: Secondary | ICD-10-CM | POA: Diagnosis not present

## 2015-03-23 ENCOUNTER — Telehealth: Payer: Self-pay

## 2015-03-23 NOTE — Telephone Encounter (Signed)
LMTRC to [facility] missed pv appt. Advd need cb by 5:00pm to avoid procedure also being canceled

## 2015-04-06 ENCOUNTER — Encounter: Payer: Self-pay | Admitting: Internal Medicine

## 2015-04-12 ENCOUNTER — Encounter (HOSPITAL_COMMUNITY): Payer: Self-pay | Admitting: Emergency Medicine

## 2015-04-12 ENCOUNTER — Ambulatory Visit (HOSPITAL_COMMUNITY)
Admission: EM | Admit: 2015-04-12 | Discharge: 2015-04-12 | Disposition: A | Discharge status: Home / Self Care | Payer: Commercial Managed Care - HMO | Attending: Family Medicine | Admitting: Family Medicine

## 2015-04-12 DIAGNOSIS — L03211 Cellulitis of face: Secondary | ICD-10-CM | POA: Diagnosis not present

## 2015-04-12 MED ORDER — SULFAMETHOXAZOLE-TRIMETHOPRIM 800-160 MG PO TABS: 1.0000 | ORAL_TABLET | Freq: Two times a day (BID) | ORAL | Status: AC

## 2015-04-12 NOTE — Discharge Instructions (Signed)

## 2015-04-12 NOTE — ED Provider Notes (Signed)
CSN: [id number]     Arrival date & time 04/12/15  1257 History   First MD Initiated Contact with Patient 04/12/15 1315     Chief Complaint  Patient presents with  . Oral Swelling    abcess over right lower jaw   (Consider location/radiation/quality/duration/timing/severity/associated sxs/prior Treatment) Patient is a 65 y.o. female presenting with facial injury. The history is provided by the patient. No language interpreter was used.  Facial Injury Mechanism of injury:  Unable to specify Location:  [place] Time since incident:  1 day Pain details:    Quality:  Aching   Severity:  Moderate   Duration:  1 day   Timing:  Constant   Progression:  Worsening Chronicity:  New Foreign body present:  No foreign bodies Relieved by:  Nothing Worsened by:  Nothing tried Ineffective treatments:  None tried Risk factors: no prior injuries to these areas   Pt complains swelling to her right face.  Past Medical History  Diagnosis Date  . Hypothyroid   . Hypertension   . Hyperlipemia   . Depression   . Alcoholism [facility])    Past Surgical History  Procedure Laterality Date  . Mastectomy    . Oophorectomy Right   . Fallopian ablation    . Myomectomy     No family history on file. Social History  Substance Use Topics  . Smoking status: Current Some Day Smoker -- 0.10 packs/day    Last Attempt to Quit: 02/28/2010  . Smokeless tobacco: None  . Alcohol Use: Yes     Comment: social   OB History    No data available     Review of Systems  All other systems reviewed and are negative.   Allergies  Codeine and Cephalexin  Home Medications   Prior to Admission medications   Medication Sig Start Date End Date Taking? Authorizing Provider  acamprosate (CAMPRAL) 333 MG tablet Take 2 tablets (666 mg total) by mouth 3 (three) times daily with meals. For alcoholism 06/28/14   Encarnacion Slates, NP  albuterol (PROVENTIL HFA;VENTOLIN HFA) 108 (90 BASE) MCG/ACT inhaler Inhale 1-2 puffs into the  lungs every 6 (six) hours as needed for wheezing or shortness of breath. 06/28/14   Encarnacion Slates, NP  amitriptyline (ELAVIL) 10 MG tablet Take 1 tablet (10 mg) at bedtime for insomnia 06/28/14   Encarnacion Slates, NP  atorvastatin (LIPITOR) 20 MG tablet Take 1 tablet (20 mg total) by mouth daily. For high cholesterol 06/28/14   Encarnacion Slates, NP  buPROPion (WELLBUTRIN XL) 300 MG 24 hr tablet Take 1 tablet (300 mg total) by mouth daily. For depression 06/28/14   Encarnacion Slates, NP  cloNIDine (CATAPRES) 0.1 MG tablet Take 1 tablet (0.1 mg total) by mouth 2 (two) times daily. For high blood pressure 06/29/14   Encarnacion Slates, NP  levothyroxine (SYNTHROID, LEVOTHROID) 137 MCG tablet Take 1 tablet (137 mcg total) by mouth daily before breakfast. For low thyroid function 06/28/14   Encarnacion Slates, NP  nicotine (NICODERM CQ - DOSED IN MG/24 HOURS) 14 mg/24hr patch Place 1 patch (14 mg total) onto the skin daily. For nicotine addiction 06/28/14   Encarnacion Slates, NP  ramelteon (ROZEREM) 8 MG tablet Take 1 tablet (8 mg total) by mouth at bedtime. For sleep 06/28/14   Encarnacion Slates, NP  sulfamethoxazole-trimethoprim (BACTRIM DS,SEPTRA DS) 800-160 MG tablet Take 1 tablet by mouth 2 (two) times daily. 04/12/15 04/19/15  Fransico Meadow, PA-C  valsartan (DIOVAN) 160 MG tablet Take 1 tablet (160 mg total) by mouth 2 (two) times daily. For high blood pressure 06/28/14   Encarnacion Slates, NP   Meds Ordered and Administered this Visit  Medications - No data to display  BP 126/92 mmHg  Pulse 83  Temp(Src) 98 F (36.7 C) (Oral)  Resp 16  Ht 5\' 1"  (1.549 m)  Wt 161 lb (73.029 kg)  BMI 30.44 kg/m2  SpO2 99% No data found.   Physical Exam  Constitutional: She appears well-developed and well-nourished.  HENT:  Head: Normocephalic.  Eyes: Conjunctivae are normal. Pupils are equal, round, and reactive to light.  Neck: Normal range of motion.  Cardiovascular: Normal rate.   Pulmonary/Chest: Effort normal.  Neurological: She is  alert.  Skin: Skin is warm.  Psychiatric: She has a normal mood and affect.  Nursing note and vitals reviewed.   ED Course  Procedures (including critical care time)  Labs Review Labs Reviewed - No data to display  Imaging Review No results found.   Visual Acuity Review  Right Eye Distance:   Left Eye Distance:   Bilateral Distance:    Right Eye Near:   Left Eye Near:    Bilateral Near:         MDM   1. Cellulitis, face    Bactrim ds Warm compresses An After Visit Summary was printed and given to the patient.   [place], PA-C 04/12/15 1329

## 2015-04-12 NOTE — ED Notes (Signed)
PT has swelling present over right jaw/ cheek. PT reports it started yesterday and she does not think it is related to any dental problems.

## 2015-04-14 ENCOUNTER — Encounter (HOSPITAL_BASED_OUTPATIENT_CLINIC_OR_DEPARTMENT_OTHER): Payer: Self-pay

## 2015-04-14 ENCOUNTER — Emergency Department (HOSPITAL_BASED_OUTPATIENT_CLINIC_OR_DEPARTMENT_OTHER)
Admission: EM | Admit: 2015-04-14 | Discharge: 2015-04-14 | Disposition: A | Discharge status: Home / Self Care | Payer: Commercial Managed Care - HMO | Attending: Emergency Medicine | Admitting: Emergency Medicine

## 2015-04-14 DIAGNOSIS — F17210 Nicotine dependence, cigarettes, uncomplicated: Secondary | ICD-10-CM | POA: Diagnosis not present

## 2015-04-14 DIAGNOSIS — I10 Essential (primary) hypertension: Secondary | ICD-10-CM | POA: Insufficient documentation

## 2015-04-14 DIAGNOSIS — E785 Hyperlipidemia, unspecified: Secondary | ICD-10-CM | POA: Insufficient documentation

## 2015-04-14 DIAGNOSIS — L0201 Cutaneous abscess of face: Secondary | ICD-10-CM | POA: Insufficient documentation

## 2015-04-14 DIAGNOSIS — R220 Localized swelling, mass and lump, head: Secondary | ICD-10-CM | POA: Diagnosis present

## 2015-04-14 DIAGNOSIS — K029 Dental caries, unspecified: Secondary | ICD-10-CM | POA: Diagnosis not present

## 2015-04-14 DIAGNOSIS — E039 Hypothyroidism, unspecified: Secondary | ICD-10-CM | POA: Insufficient documentation

## 2015-04-14 DIAGNOSIS — F329 Major depressive disorder, single episode, unspecified: Secondary | ICD-10-CM | POA: Insufficient documentation

## 2015-04-14 MED ORDER — PENICILLIN V POTASSIUM 500 MG PO TABS: 500.0000 mg | ORAL_TABLET | Freq: Three times a day (TID) | ORAL | Status: DC

## 2015-04-14 MED ORDER — LIDOCAINE-EPINEPHRINE 1 %-1:100000 IJ SOLN
INTRAMUSCULAR | Status: AC
  Administered 2015-04-14: 1 mL
  Filled 2015-04-14: qty 1

## 2015-04-14 MED FILL — PENICILLIN VK 500 MG TABLET: 500 | 7 days supply | Qty: 21 | Fill #0

## 2015-04-14 NOTE — ED Notes (Signed)
C/o swelling to right cheek x 2 days-was seen at urgent care and started on abx-pt reports is worse

## 2015-04-14 NOTE — Discharge Instructions (Signed)
Please read and follow all provided instructions.  Your diagnoses today include:  1. Facial abscess    Tests performed today include:  Vital signs. See below for your results today.   Medications prescribed:   Penicillin - antibiotic  You have been prescribed an antibiotic medicine: take the entire course of medicine even if you are feeling better. Stopping early can cause the antibiotic not to work.  Take any prescribed medications only as directed.   Home care instructions:   Follow any educational materials contained in this packet  Follow-up instructions: Return to the Emergency Department in 48 hours for a recheck if your symptoms are not significantly improved.  Please follow-up with your primary care provider in the next 1 week for further evaluation of your symptoms.   Return instructions:  Return to the Emergency Department if you have:  Fever  Worsening symptoms  Worsening pain  Worsening swelling  Redness of the skin that moves away from the affected area, especially if it streaks away from the affected area   Any other emergent concerns   Your vital signs today were: BP 134/74 mmHg   Pulse 79   Temp(Src) 98 F (36.7 C) (Oral)   Resp 20   Ht 5\' 1"  (1.549 m)   Wt 73.029 kg   BMI 30.44 kg/m2   SpO2 100% If your blood pressure (BP) was elevated above 135/85 this visit, please have this repeated by your doctor within one month. --------------

## 2015-04-14 NOTE — ED Notes (Signed)
MD at bedside. 

## 2015-04-14 NOTE — ED Provider Notes (Signed)
CSN: [id number]     Arrival date & time 04/14/15  1154 History   First MD Initiated Contact with Patient 04/14/15 1210     Chief Complaint  Patient presents with  . Facial Swelling     (Consider location/radiation/quality/duration/timing/severity/associated sxs/prior Treatment) HPI Comments: Patient presents with 3 days of right facial swelling. No dental pain prior. No fevers, nausea or vomiting. Patient seen at urgent care 2 days ago started on Bactrim. Swelling has progressed. No neck swelling or difficulty swallowing. The onset of this condition was acute. The course is constant. Aggravating factors: none. Alleviating factors: none.    The history is provided by the patient.    Past Medical History  Diagnosis Date  . Hypothyroid   . Hypertension   . Hyperlipemia   . Depression   . Alcoholism [facility])    Past Surgical History  Procedure Laterality Date  . Mastectomy    . Oophorectomy Right   . Fallopian ablation    . Myomectomy     No family history on file. Social History  Substance Use Topics  . Smoking status: Current Some Day Smoker -- 0.10 packs/day  . Smokeless tobacco: None  . Alcohol Use: No   OB History    No data available     Review of Systems  Constitutional: Negative for fever.  HENT: Positive for facial swelling. Negative for dental problem, ear pain, sore throat and trouble swallowing.   Respiratory: Negative for shortness of breath and stridor.   Gastrointestinal: Negative for nausea and vomiting.  Musculoskeletal: Negative for neck pain.  Skin: Negative for color change.       Positive for abscess.  Neurological: Negative for headaches.  Hematological: Negative for adenopathy.      Allergies  Codeine and Cephalexin  Home Medications   Prior to Admission medications   Medication Sig Start Date End Date Taking? Authorizing Provider  acamprosate (CAMPRAL) 333 MG tablet Take 2 tablets (666 mg total) by mouth 3 (three) times daily with meals.  For alcoholism 06/28/14   Encarnacion Slates, NP  albuterol (PROVENTIL HFA;VENTOLIN HFA) 108 (90 BASE) MCG/ACT inhaler Inhale 1-2 puffs into the lungs every 6 (six) hours as needed for wheezing or shortness of breath. 06/28/14   Encarnacion Slates, NP  amitriptyline (ELAVIL) 10 MG tablet Take 1 tablet (10 mg) at bedtime for insomnia 06/28/14   Encarnacion Slates, NP  atorvastatin (LIPITOR) 20 MG tablet Take 1 tablet (20 mg total) by mouth daily. For high cholesterol 06/28/14   Encarnacion Slates, NP  buPROPion (WELLBUTRIN XL) 300 MG 24 hr tablet Take 1 tablet (300 mg total) by mouth daily. For depression 06/28/14   Encarnacion Slates, NP  cloNIDine (CATAPRES) 0.1 MG tablet Take 1 tablet (0.1 mg total) by mouth 2 (two) times daily. For high blood pressure 06/29/14   Encarnacion Slates, NP  levothyroxine (SYNTHROID, LEVOTHROID) 137 MCG tablet Take 1 tablet (137 mcg total) by mouth daily before breakfast. For low thyroid function 06/28/14   Encarnacion Slates, NP  nicotine (NICODERM CQ - DOSED IN MG/24 HOURS) 14 mg/24hr patch Place 1 patch (14 mg total) onto the skin daily. For nicotine addiction 06/28/14   Encarnacion Slates, NP  penicillin v potassium (VEETID) 500 MG tablet Take 1 tablet (500 mg total) by mouth 3 (three) times daily. 04/14/15   Carlisle Cater, PA-C  ramelteon (ROZEREM) 8 MG tablet Take 1 tablet (8 mg total) by mouth at bedtime. For sleep 06/28/14  Encarnacion Slates, NP  sulfamethoxazole-trimethoprim (BACTRIM DS,SEPTRA DS) 800-160 MG tablet Take 1 tablet by mouth 2 (two) times daily. 04/12/15 04/19/15  Fransico Meadow, PA-C  valsartan (DIOVAN) 160 MG tablet Take 1 tablet (160 mg total) by mouth 2 (two) times daily. For high blood pressure 06/28/14   Encarnacion Slates, NP   BP 134/74 mmHg  Pulse 79  Temp(Src) 98 F (36.7 C) (Oral)  Resp 20  Ht 5\' 1"  (1.549 m)  Wt 73.029 kg  BMI 30.44 kg/m2  SpO2 100% Physical Exam  Constitutional: She appears well-developed and well-nourished.  HENT:  Head: Normocephalic and atraumatic.  Right Ear:  Tympanic membrane, external ear and ear canal normal.  Left Ear: Tympanic membrane, external ear and ear canal normal.  Nose: Nose normal.  Mouth/Throat: Uvula is midline, oropharynx is clear and moist and mucous membranes are normal. No trismus in the jaw. Abnormal dentition. Dental caries present. No dental abscesses or uvula swelling. No tonsillar abscesses.  No swelling or erythema noted on exam. Very tender over R mandible. There is 2cm fluctuant and indurated area over the right mandible.   Eyes: Conjunctivae are normal.  Neck: Normal range of motion. Neck supple.  No neck swelling or Ludwig's angina  Lymphadenopathy:    She has no cervical adenopathy.  Neurological: She is alert.  Skin: Skin is warm and dry.  Psychiatric: She has a normal mood and affect.  Nursing note and vitals reviewed.   ED Course  Procedures (including critical care time) Labs Review Labs Reviewed - No data to display  Imaging Review No results found. I have personally reviewed and evaluated these images and lab results as part of my medical decision-making.   EKG Interpretation None       Patient seen and examined.   Vital signs reviewed and are as follows: BP 134/74 mmHg  Pulse 79  Temp(Src) 98 F (36.7 C) (Oral)  Resp 20  Ht 5\' 1"  (1.549 m)  Wt 73.029 kg  BMI 30.44 kg/m2  SpO2 100%    EMERGENCY DEPARTMENT US SOFT TISSUE INTERPRETATION "Study: Limited Ultrasound of the noted body part in comments below"  INDICATIONS: Soft tissue infection Multiple views of the body part are obtained with a multi-frequency linear probe  PERFORMED BY:  Myself  IMAGES ARCHIVED?: Yes  SIDE:Right   BODY PART:Other soft tisse (comment in note)  FINDINGS: Abscess present  LIMITATIONS:  none  INTERPRETATION:  Abscess present    INCISION AND DRAINAGE Performed by: Faustino Congress Consent: Verbal consent obtained. Risks and benefits: risks, benefits and alternatives were discussed Type:  abscess  Body area: R face  Anesthesia: local infiltration  Needle aspiration performed with 18g needle, remainder of purulent fluid expressed through needle   Local anesthetic: lidocaine 1% with epinephrine  Anesthetic total: 3 ml  Complexity: complex  Drainage: purulent  Drainage amount: moderate  Packing material: none  Patient tolerance: Patient tolerated the procedure well with no immediate complications.  1:24 PM The patient was urged to return to the Emergency Department urgently with worsening pain, swelling, expanding erythema especially if it streaks away from the affected area, fever, or if they have any other concerns.   The patient was urged to return to the Emergency Department or go to their PCP in 48 hours for wound recheck if the area is not significantly improved.  The patient verbalized understanding and stated agreement with this plan.        MDM   Final diagnoses:  Facial abscess   Patient with facial abscess. This was evaluated and I&D performed. Patient is already on Bactrim. Will add penicillin given the possibility of periodontal etiology. Pain is controlled. Patient appears well, nontoxic.    Carlisle Cater, PA-C 04/14/15 [place], MD 04/15/15 [phone]

## 2015-04-19 DIAGNOSIS — I10 Essential (primary) hypertension: Secondary | ICD-10-CM | POA: Diagnosis not present

## 2015-04-19 DIAGNOSIS — Z0000 Encounter for general adult medical examination without abnormal findings: Secondary | ICD-10-CM | POA: Diagnosis not present

## 2015-04-19 DIAGNOSIS — R739 Hyperglycemia, unspecified: Secondary | ICD-10-CM | POA: Diagnosis not present

## 2015-04-19 DIAGNOSIS — N390 Urinary tract infection, site not specified: Secondary | ICD-10-CM | POA: Diagnosis not present

## 2015-04-19 DIAGNOSIS — R252 Cramp and spasm: Secondary | ICD-10-CM | POA: Diagnosis not present

## 2015-04-19 DIAGNOSIS — E784 Other hyperlipidemia: Secondary | ICD-10-CM | POA: Diagnosis not present

## 2015-04-19 DIAGNOSIS — R8290 Unspecified abnormal findings in urine: Secondary | ICD-10-CM | POA: Diagnosis not present

## 2015-04-19 DIAGNOSIS — E0500 Thyrotoxicosis with diffuse goiter without thyrotoxic crisis or storm: Secondary | ICD-10-CM | POA: Diagnosis not present

## 2015-04-25 DIAGNOSIS — F3341 Major depressive disorder, recurrent, in partial remission: Secondary | ICD-10-CM | POA: Diagnosis not present

## 2015-04-25 DIAGNOSIS — E784 Other hyperlipidemia: Secondary | ICD-10-CM | POA: Diagnosis not present

## 2015-04-25 DIAGNOSIS — Z0000 Encounter for general adult medical examination without abnormal findings: Secondary | ICD-10-CM | POA: Diagnosis not present

## 2015-04-25 DIAGNOSIS — M859 Disorder of bone density and structure, unspecified: Secondary | ICD-10-CM | POA: Diagnosis not present

## 2015-04-25 DIAGNOSIS — R350 Frequency of micturition: Secondary | ICD-10-CM | POA: Diagnosis not present

## 2015-04-25 DIAGNOSIS — M1990 Unspecified osteoarthritis, unspecified site: Secondary | ICD-10-CM | POA: Diagnosis not present

## 2015-04-25 DIAGNOSIS — F1320 Sedative, hypnotic or anxiolytic dependence, uncomplicated: Secondary | ICD-10-CM | POA: Diagnosis not present

## 2015-04-25 DIAGNOSIS — M272 Inflammatory conditions of jaws: Secondary | ICD-10-CM | POA: Diagnosis not present

## 2015-04-26 DIAGNOSIS — L03211 Cellulitis of face: Secondary | ICD-10-CM | POA: Diagnosis not present

## 2015-04-27 DIAGNOSIS — Z1212 Encounter for screening for malignant neoplasm of rectum: Secondary | ICD-10-CM | POA: Diagnosis not present

## 2015-05-02 HISTORY — PX: HAND SURGERY: SHX662

## 2015-05-09 DIAGNOSIS — L03211 Cellulitis of face: Secondary | ICD-10-CM | POA: Diagnosis not present

## 2015-05-25 DIAGNOSIS — Z6832 Body mass index (BMI) 32.0-32.9, adult: Secondary | ICD-10-CM | POA: Diagnosis not present

## 2015-05-25 DIAGNOSIS — L237 Allergic contact dermatitis due to plants, except food: Secondary | ICD-10-CM | POA: Diagnosis not present

## 2015-05-26 ENCOUNTER — Other Ambulatory Visit: Payer: Self-pay | Admitting: Obstetrics & Gynecology

## 2015-05-26 DIAGNOSIS — Z1231 Encounter for screening mammogram for malignant neoplasm of breast: Secondary | ICD-10-CM | POA: Diagnosis not present

## 2015-05-26 DIAGNOSIS — Z01419 Encounter for gynecological examination (general) (routine) without abnormal findings: Secondary | ICD-10-CM | POA: Diagnosis not present

## 2015-05-26 DIAGNOSIS — Z124 Encounter for screening for malignant neoplasm of cervix: Secondary | ICD-10-CM | POA: Diagnosis not present

## 2015-05-27 LAB — CYTOLOGY - PAP

## 2015-06-17 ENCOUNTER — Encounter: Payer: Self-pay | Admitting: Internal Medicine

## 2015-06-23 ENCOUNTER — Ambulatory Visit: Payer: Self-pay | Admitting: Internal Medicine

## 2015-07-04 ENCOUNTER — Ambulatory Visit (AMBULATORY_SURGERY_CENTER): Payer: Self-pay | Admitting: *Deleted

## 2015-07-04 VITALS — Ht 61.0 in | Wt 170.0 lb

## 2015-07-04 DIAGNOSIS — Z86010 Personal history of colonic polyps: Secondary | ICD-10-CM

## 2015-07-04 MED ORDER — NA SULFATE-K SULFATE-MG SULF 17.5-3.13-1.6 GM/177ML PO SOLN: 1.0000 | Freq: Once | ORAL | Status: DC

## 2015-07-04 NOTE — Progress Notes (Signed)
No egg or soy allergy known to patient  No issues with past sedation with any surgeries  or procedures, no intubation problems  No diet pills per patient No home 02 use per patient  No blood thinners per patient  Pt denies issues with constipation  emmi declined

## 2015-07-06 ENCOUNTER — Encounter: Payer: Self-pay | Admitting: Internal Medicine

## 2015-07-19 ENCOUNTER — Ambulatory Visit (AMBULATORY_SURGERY_CENTER): Payer: Commercial Managed Care - HMO | Admitting: Internal Medicine

## 2015-07-19 ENCOUNTER — Encounter: Payer: Self-pay | Admitting: Internal Medicine

## 2015-07-19 VITALS — BP 124/61 | HR 66 | Temp 98.4°F | Resp 21 | Ht 61.0 in | Wt 170.0 lb

## 2015-07-19 DIAGNOSIS — Z86010 Personal history of colonic polyps: Secondary | ICD-10-CM

## 2015-07-19 DIAGNOSIS — D122 Benign neoplasm of ascending colon: Secondary | ICD-10-CM

## 2015-07-19 DIAGNOSIS — Z1211 Encounter for screening for malignant neoplasm of colon: Secondary | ICD-10-CM | POA: Diagnosis not present

## 2015-07-19 MED ORDER — SODIUM CHLORIDE 0.9 % IV SOLN: 500.0000 mL | INTRAVENOUS | Status: DC

## 2015-07-19 NOTE — Progress Notes (Signed)
Called to room to assist during endoscopic procedure.  Patient ID and intended procedure confirmed with present staff. Received instructions for my participation in the procedure from the performing physician.  

## 2015-07-19 NOTE — Patient Instructions (Signed)
YOU HAD AN ENDOSCOPIC PROCEDURE TODAY AT [facility]:   Refer to the procedure report that was given to you for any specific questions about what was found during the examination.  If the procedure report does not answer your questions, please call your gastroenterologist to clarify.  If you requested that your care partner not be given the details of your procedure findings, then the procedure report has been included in a sealed envelope for you to review at your convenience later.  YOU SHOULD EXPECT: Some feelings of bloating in the abdomen. Passage of more gas than usual.  Walking can help get rid of the air that was put into your GI tract during the procedure and reduce the bloating. If you had a lower endoscopy (such as a colonoscopy or flexible sigmoidoscopy) you may notice spotting of blood in your stool or on the toilet paper. If you underwent a bowel prep for your procedure, you may not have a normal bowel movement for a few days.  Please Note:  You might notice some irritation and congestion in your nose or some drainage.  This is from the oxygen used during your procedure.  There is no need for concern and it should clear up in a day or so.  SYMPTOMS TO REPORT IMMEDIATELY:   Following lower endoscopy (colonoscopy or flexible sigmoidoscopy):  Excessive amounts of blood in the stool  Significant tenderness or worsening of abdominal pains  Swelling of the abdomen that is new, acute  Fever of 100F or higher   For urgent or emergent issues, a gastroenterologist can be reached at any hour by calling [phone].   DIET: Your first meal following the procedure should be a small meal and then it is ok to progress to your normal diet. Heavy or fried foods are harder to digest and may make you feel nauseous or bloated.  Likewise, meals heavy in dairy and vegetables can increase bloating.  Drink plenty of fluids but you should avoid alcoholic beverages for 24  hours.  ACTIVITY:  You should plan to take it easy for the rest of today and you should NOT DRIVE or use heavy machinery until tomorrow (because of the sedation medicines used during the test).    FOLLOW UP: Our staff will call the number listed on your records the next business day following your procedure to check on you and address any questions or concerns that you may have regarding the information given to you following your procedure. If we do not reach you, we will leave a message.  However, if you are feeling well and you are not experiencing any problems, there is no need to return our call.  We will assume that you have returned to your regular daily activities without incident.  If any biopsies were taken you will be contacted by phone or by letter within the next 1-3 weeks.  Please call us at [phone] if you have not heard about the biopsies in 3 weeks.    SIGNATURES/CONFIDENTIALITY: You and/or your care partner have signed paperwork which will be entered into your electronic medical record.  These signatures attest to the fact that that the information above on your After Visit Summary has been reviewed and is understood.  Full responsibility of the confidentiality of this discharge information lies with you and/or your care-partner.   Polyp handout given. Resume current medications.

## 2015-07-19 NOTE — Progress Notes (Signed)
A and O x3. Report to RN. Tolerated MAC anesthesia well. 

## 2015-07-19 NOTE — Op Note (Signed)
[facility] Patient Name: Melissa Wolfe Procedure Date: 07/19/2015 1:47 PM MRN: [id number] Endoscopist: Docia Chuck. Henrene Pastor , MD Age: 65 Referring MD:  Date of Birth: 09-Oct-1950 Gender: Female Account #: [id number] Procedure:                Colonoscopy, with cold snare polypectomy x 1 Indications:              Surveillance: Personal history of adenomatous                            polyps on last colonoscopy > 5 years ago, High risk                            colon cancer surveillance: Personal history of                            non-advanced adenoma. Index exam July 2011 Medicines:                Monitored Anesthesia Care Procedure:                Pre-Anesthesia Assessment:                           - Prior to the procedure, a History and Physical                            was performed, and patient medications and                            allergies were reviewed. The patient's tolerance of                            previous anesthesia was also reviewed. The risks                            and benefits of the procedure and the sedation                            options and risks were discussed with the patient.                            All questions were answered, and informed consent                            was obtained. Prior Anticoagulants: The patient has                            taken no previous anticoagulant or antiplatelet                            agents. ASA Grade Assessment: II - A patient with                            mild systemic disease. After reviewing the risks  and benefits, the patient was deemed in                            satisfactory condition to undergo the procedure.                           After obtaining informed consent, the colonoscope                            was passed under direct vision. Throughout the                            procedure, the patient's blood pressure, pulse, and             oxygen saturations were monitored continuously. The                            Model CF-HQ190L [id number]) scope was introduced                            through the anus and advanced to the the cecum,                            identified by appendiceal orifice and ileocecal                            valve. The ileocecal valve, appendiceal orifice,                            and rectum were photographed. The quality of the                            bowel preparation was good. The colonoscopy was                            performed without difficulty. The patient tolerated                            the procedure well. The bowel preparation used was                            SUPREP. Scope In: 1:58:14 PM Scope Out: 2:10:59 PM Scope Withdrawal Time: 0 hours 10 minutes 8 seconds  Total Procedure Duration: 0 hours 12 minutes 45 seconds  Findings:                 A 4 mm polyp was found in the ascending colon. The                            polyp was removed with a cold snare. Resection and                            retrieval were complete.  The exam was otherwise without abnormality on                            direct and retroflexion views. Complications:            No immediate complications. Estimated blood loss:                            None. Estimated Blood Loss:     Estimated blood loss: none. Impression:               - One 4 mm polyp in the ascending colon, removed                            with a cold snare. Resected and retrieved.                           - The examination was otherwise normal on direct                            and retroflexion views. Recommendation:           - Repeat colonoscopy in 5 years for surveillance.                           - Patient has a contact number available for                            emergencies. The signs and symptoms of potential                            delayed complications were discussed  with the                            patient. Return to normal activities tomorrow.                            Written discharge instructions were provided to the                            patient.                           - Resume previous diet.                           - Continue present medications.                           - Await pathology results. Docia Chuck. Henrene Pastor, MD 07/19/2015 2:14:32 PM This report has been signed electronically.

## 2015-07-20 ENCOUNTER — Telehealth: Payer: Self-pay | Admitting: *Deleted

## 2015-07-20 NOTE — Telephone Encounter (Signed)
  Follow up Call-  Call back number 07/19/2015  Post procedure Call Back phone  # [phone]  Permission to leave phone message Yes     Patient questions:  Do you have a fever, pain , or abdominal swelling? No. Pain Score  0 *  Have you tolerated food without any problems? Yes.    Have you been able to return to your normal activities? Yes.    Do you have any questions about your discharge instructions: Diet   No. Medications  No. Follow up visit  No.  Do you have questions or concerns about your Care? No.  Actions: * If pain score is 4 or above: No action needed, pain <4.

## 2015-07-25 ENCOUNTER — Encounter: Payer: Self-pay | Admitting: Internal Medicine

## 2015-10-25 DIAGNOSIS — F17210 Nicotine dependence, cigarettes, uncomplicated: Secondary | ICD-10-CM | POA: Diagnosis not present

## 2015-10-25 DIAGNOSIS — F3341 Major depressive disorder, recurrent, in partial remission: Secondary | ICD-10-CM | POA: Diagnosis not present

## 2015-10-25 DIAGNOSIS — I10 Essential (primary) hypertension: Secondary | ICD-10-CM | POA: Diagnosis not present

## 2015-10-25 DIAGNOSIS — F1020 Alcohol dependence, uncomplicated: Secondary | ICD-10-CM | POA: Diagnosis not present

## 2015-10-25 DIAGNOSIS — Z6833 Body mass index (BMI) 33.0-33.9, adult: Secondary | ICD-10-CM | POA: Diagnosis not present

## 2015-10-25 DIAGNOSIS — E038 Other specified hypothyroidism: Secondary | ICD-10-CM | POA: Diagnosis not present

## 2015-10-25 DIAGNOSIS — R7309 Other abnormal glucose: Secondary | ICD-10-CM | POA: Diagnosis not present

## 2015-10-25 DIAGNOSIS — F418 Other specified anxiety disorders: Secondary | ICD-10-CM | POA: Diagnosis not present

## 2015-11-15 DIAGNOSIS — Z23 Encounter for immunization: Secondary | ICD-10-CM | POA: Diagnosis not present

## 2015-11-15 DIAGNOSIS — F3341 Major depressive disorder, recurrent, in partial remission: Secondary | ICD-10-CM | POA: Diagnosis not present

## 2015-11-15 DIAGNOSIS — Z6833 Body mass index (BMI) 33.0-33.9, adult: Secondary | ICD-10-CM | POA: Diagnosis not present

## 2015-11-15 DIAGNOSIS — R05 Cough: Secondary | ICD-10-CM | POA: Diagnosis not present

## 2015-11-15 DIAGNOSIS — I10 Essential (primary) hypertension: Secondary | ICD-10-CM | POA: Diagnosis not present

## 2015-11-15 DIAGNOSIS — F1320 Sedative, hypnotic or anxiolytic dependence, uncomplicated: Secondary | ICD-10-CM | POA: Diagnosis not present

## 2015-12-09 DIAGNOSIS — Z6832 Body mass index (BMI) 32.0-32.9, adult: Secondary | ICD-10-CM | POA: Diagnosis not present

## 2015-12-09 DIAGNOSIS — I10 Essential (primary) hypertension: Secondary | ICD-10-CM | POA: Diagnosis not present

## 2016-04-19 DIAGNOSIS — M859 Disorder of bone density and structure, unspecified: Secondary | ICD-10-CM | POA: Diagnosis not present

## 2016-04-19 DIAGNOSIS — Z1212 Encounter for screening for malignant neoplasm of rectum: Secondary | ICD-10-CM | POA: Diagnosis not present

## 2016-04-19 DIAGNOSIS — R7309 Other abnormal glucose: Secondary | ICD-10-CM | POA: Diagnosis not present

## 2016-04-19 DIAGNOSIS — I10 Essential (primary) hypertension: Secondary | ICD-10-CM | POA: Diagnosis not present

## 2016-04-19 DIAGNOSIS — E038 Other specified hypothyroidism: Secondary | ICD-10-CM | POA: Diagnosis not present

## 2016-04-19 DIAGNOSIS — E784 Other hyperlipidemia: Secondary | ICD-10-CM | POA: Diagnosis not present

## 2016-04-26 DIAGNOSIS — F1320 Sedative, hypnotic or anxiolytic dependence, uncomplicated: Secondary | ICD-10-CM | POA: Diagnosis not present

## 2016-04-26 DIAGNOSIS — R7309 Other abnormal glucose: Secondary | ICD-10-CM | POA: Diagnosis not present

## 2016-04-26 DIAGNOSIS — M859 Disorder of bone density and structure, unspecified: Secondary | ICD-10-CM | POA: Diagnosis not present

## 2016-04-26 DIAGNOSIS — M5136 Other intervertebral disc degeneration, lumbar region: Secondary | ICD-10-CM | POA: Diagnosis not present

## 2016-04-26 DIAGNOSIS — Z0000 Encounter for general adult medical examination without abnormal findings: Secondary | ICD-10-CM | POA: Diagnosis not present

## 2016-04-26 DIAGNOSIS — F1020 Alcohol dependence, uncomplicated: Secondary | ICD-10-CM | POA: Diagnosis not present

## 2016-04-26 DIAGNOSIS — R0789 Other chest pain: Secondary | ICD-10-CM | POA: Diagnosis not present

## 2016-04-26 DIAGNOSIS — H9190 Unspecified hearing loss, unspecified ear: Secondary | ICD-10-CM | POA: Diagnosis not present

## 2016-08-30 DIAGNOSIS — F3341 Major depressive disorder, recurrent, in partial remission: Secondary | ICD-10-CM | POA: Diagnosis not present

## 2016-08-30 DIAGNOSIS — R0789 Other chest pain: Secondary | ICD-10-CM | POA: Diagnosis not present

## 2016-08-30 DIAGNOSIS — F1320 Sedative, hypnotic or anxiolytic dependence, uncomplicated: Secondary | ICD-10-CM | POA: Diagnosis not present

## 2016-08-30 DIAGNOSIS — R7309 Other abnormal glucose: Secondary | ICD-10-CM | POA: Diagnosis not present

## 2016-08-30 DIAGNOSIS — F1020 Alcohol dependence, uncomplicated: Secondary | ICD-10-CM | POA: Diagnosis not present

## 2016-08-30 DIAGNOSIS — F17210 Nicotine dependence, cigarettes, uncomplicated: Secondary | ICD-10-CM | POA: Diagnosis not present

## 2016-08-30 DIAGNOSIS — E038 Other specified hypothyroidism: Secondary | ICD-10-CM | POA: Diagnosis not present

## 2016-08-30 DIAGNOSIS — I10 Essential (primary) hypertension: Secondary | ICD-10-CM | POA: Diagnosis not present

## 2016-10-09 ENCOUNTER — Other Ambulatory Visit: Payer: Self-pay | Admitting: Internal Medicine

## 2016-10-09 DIAGNOSIS — Z1231 Encounter for screening mammogram for malignant neoplasm of breast: Secondary | ICD-10-CM

## 2016-10-23 ENCOUNTER — Ambulatory Visit
Admission: RE | Admit: 2016-10-23 | Discharge: 2016-10-23 | Disposition: A | Discharge status: Home / Self Care | Payer: Medicare HMO | Source: Ambulatory Visit | Attending: Internal Medicine | Admitting: Internal Medicine

## 2016-10-23 ENCOUNTER — Ambulatory Visit: Payer: Self-pay

## 2016-10-23 DIAGNOSIS — Z1231 Encounter for screening mammogram for malignant neoplasm of breast: Secondary | ICD-10-CM | POA: Diagnosis not present

## 2016-12-14 DIAGNOSIS — I10 Essential (primary) hypertension: Secondary | ICD-10-CM | POA: Diagnosis not present

## 2016-12-14 DIAGNOSIS — R7309 Other abnormal glucose: Secondary | ICD-10-CM | POA: Diagnosis not present

## 2016-12-14 DIAGNOSIS — F3341 Major depressive disorder, recurrent, in partial remission: Secondary | ICD-10-CM | POA: Diagnosis not present

## 2016-12-14 DIAGNOSIS — F17210 Nicotine dependence, cigarettes, uncomplicated: Secondary | ICD-10-CM | POA: Diagnosis not present

## 2016-12-14 DIAGNOSIS — F1320 Sedative, hypnotic or anxiolytic dependence, uncomplicated: Secondary | ICD-10-CM | POA: Diagnosis not present

## 2016-12-14 DIAGNOSIS — F1020 Alcohol dependence, uncomplicated: Secondary | ICD-10-CM | POA: Diagnosis not present

## 2016-12-14 DIAGNOSIS — R0789 Other chest pain: Secondary | ICD-10-CM | POA: Diagnosis not present

## 2016-12-14 DIAGNOSIS — Z23 Encounter for immunization: Secondary | ICD-10-CM | POA: Diagnosis not present

## 2017-02-04 DIAGNOSIS — Z6835 Body mass index (BMI) 35.0-35.9, adult: Secondary | ICD-10-CM | POA: Diagnosis not present

## 2017-02-04 DIAGNOSIS — I10 Essential (primary) hypertension: Secondary | ICD-10-CM | POA: Diagnosis not present

## 2017-02-04 DIAGNOSIS — M545 Low back pain: Secondary | ICD-10-CM | POA: Diagnosis not present

## 2017-02-08 ENCOUNTER — Encounter (HOSPITAL_COMMUNITY): Payer: Self-pay | Admitting: *Deleted

## 2017-02-08 DIAGNOSIS — M25352 Other instability, left hip: Secondary | ICD-10-CM | POA: Diagnosis not present

## 2017-02-08 DIAGNOSIS — Z5321 Procedure and treatment not carried out due to patient leaving prior to being seen by health care provider: Secondary | ICD-10-CM | POA: Diagnosis not present

## 2017-02-08 DIAGNOSIS — M545 Low back pain: Secondary | ICD-10-CM | POA: Diagnosis not present

## 2017-02-08 DIAGNOSIS — R32 Unspecified urinary incontinence: Secondary | ICD-10-CM | POA: Diagnosis not present

## 2017-02-08 DIAGNOSIS — R350 Frequency of micturition: Secondary | ICD-10-CM | POA: Insufficient documentation

## 2017-02-08 DIAGNOSIS — R3915 Urgency of urination: Secondary | ICD-10-CM | POA: Insufficient documentation

## 2017-02-08 DIAGNOSIS — M25552 Pain in left hip: Secondary | ICD-10-CM | POA: Diagnosis not present

## 2017-02-08 LAB — URINALYSIS, ROUTINE W REFLEX MICROSCOPIC
BILIRUBIN URINE: NEGATIVE
Glucose, UA: NEGATIVE mg/dL
HGB URINE DIPSTICK: NEGATIVE
KETONES UR: NEGATIVE mg/dL
NITRITE: NEGATIVE
PROTEIN: NEGATIVE mg/dL
Specific Gravity, Urine: 1.017 (ref 1.005–1.030)
pH: 6 (ref 5.0–8.0)

## 2017-02-08 NOTE — ED Triage Notes (Signed)
Pt reports intermittent left flank/hip pain. She has had increased urgency and frequency over the past 4-5 days, no nausea or fevers or burning with urination.

## 2017-02-08 NOTE — ED Triage Notes (Signed)
Pt also states that she had some pain in the left hip that went down her left hip to the leg about 2 months ago, dx with sciatica. For about 2 weeks she has had some falls and had an unsteady gait. She is ambulatory to triage with steady gait.

## 2017-02-08 NOTE — ED Triage Notes (Signed)
Pt says that she did not take her evening BP meds at 4pm like she normally does.

## 2017-02-09 ENCOUNTER — Emergency Department (HOSPITAL_COMMUNITY)
Admission: EM | Admit: 2017-02-09 | Discharge: 2017-02-09 | Disposition: A | Discharge status: Home / Self Care | Payer: Medicare HMO | Attending: Emergency Medicine | Admitting: Emergency Medicine

## 2017-02-09 ENCOUNTER — Encounter: Payer: Self-pay | Admitting: Emergency Medicine

## 2017-02-09 ENCOUNTER — Emergency Department
Admission: EM | Admit: 2017-02-09 | Discharge: 2017-02-09 | Disposition: A | Discharge status: Home / Self Care | Payer: Medicare HMO | Attending: Emergency Medicine | Admitting: Emergency Medicine

## 2017-02-09 ENCOUNTER — Other Ambulatory Visit: Payer: Self-pay

## 2017-02-09 DIAGNOSIS — N390 Urinary tract infection, site not specified: Secondary | ICD-10-CM

## 2017-02-09 DIAGNOSIS — Z79899 Other long term (current) drug therapy: Secondary | ICD-10-CM | POA: Insufficient documentation

## 2017-02-09 DIAGNOSIS — F17210 Nicotine dependence, cigarettes, uncomplicated: Secondary | ICD-10-CM | POA: Insufficient documentation

## 2017-02-09 DIAGNOSIS — E039 Hypothyroidism, unspecified: Secondary | ICD-10-CM | POA: Diagnosis not present

## 2017-02-09 DIAGNOSIS — I10 Essential (primary) hypertension: Secondary | ICD-10-CM | POA: Diagnosis not present

## 2017-02-09 DIAGNOSIS — R300 Dysuria: Secondary | ICD-10-CM | POA: Diagnosis not present

## 2017-02-09 DIAGNOSIS — R350 Frequency of micturition: Secondary | ICD-10-CM | POA: Diagnosis present

## 2017-02-09 MED ORDER — SULFAMETHOXAZOLE-TRIMETHOPRIM 800-160 MG PO TABS
1.0000 | ORAL_TABLET | Freq: Once | ORAL | Status: AC
  Administered 2017-02-09: 1 via ORAL
  Filled 2017-02-09: qty 1

## 2017-02-09 MED ORDER — SULFAMETHOXAZOLE-TRIMETHOPRIM 800-160 MG PO TABS: 1.0000 | ORAL_TABLET | Freq: Two times a day (BID) | ORAL | 0 refills | Status: DC

## 2017-02-09 NOTE — ED Notes (Signed)
No response in waiting area to be taken to treatment room

## 2017-02-09 NOTE — ED Notes (Signed)
Chart reviewed. Patient noted to be requesting imaging for complaint. Level changed to 3 and patient left main wait.

## 2017-02-09 NOTE — ED Provider Notes (Signed)
[facility] Emergency Department Provider Note ____________________________________________   I have reviewed the triage vital signs and the triage nursing note.  HISTORY  Chief Complaint Urinary Frequency   Historian Patient  HPI Melissa Wolfe is a 67 y.o. female presents to the ED for evaluation of urinary frequency and hesitancy and urgency for about 2 days.  Patient states that about 1 week ago she developed left low back/left hip pain that within 24 hours was radiating down the left leg.  Her primary care doctor was unavailable and she saw the physician assistant who based on those symptoms, no urinary symptoms at all, checked a urinalysis which was normal, and then prescribed prednisone, Robaxin, and Naprosyn for a presumptive diagnosis of sciatica.  The patient had pain improvement after about 2 days.  And then in another 24 hours the patient developed urinary frequency and hesitancy.  She called her primary care doctor's office on Thursday and tried to get an appointment for Friday which was yesterday, and there were no openings.  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  The patient went to [facility] emergency department last night and waited there for 7 hours per the patient, and then went ahead and left and came over to [facility] emergency department.  She states that while she was there she felt like she had to go to the bathroom and she got up quickly and urinated on herself.  There is no saddle anesthesia.  No true incontinence.  No additional or ongoing back pain or hip pain at this point that is resolved.  Does report a history of bulging disks in her back in the past.  She states that she was told earlier in the week that she should go ahead  and follow-up with her orthopedic doctor.    Past Medical History:  Diagnosis Date  . Alcoholism ([place])   . Allergy   . Depression   . Hyperlipemia   . Hypertension   . Hypothyroid     Patient Active Problem List   Diagnosis Date Noted  . Alcohol dependence with uncomplicated withdrawal ([place])   . Uncomplicated alcohol dependence ([place])   . Alcohol dependence ([place]) 06/23/2014  . Depression, major, recurrent, moderate ([place]) 06/23/2014  . Alcohol abuse with alcohol-induced disorder ([place]) 06/22/2014    Past Surgical History:  Procedure Laterality Date  . BREAST EXCISIONAL BIOPSY Left 2008   benign  . COLONOSCOPY    . fallopian ablation    . HAND SURGERY Left 05-2015  . MASTECTOMY Left   . MYOMECTOMY     x2  . OOPHORECTOMY Right   . POLYPECTOMY      Prior to Admission medications   Medication Sig Start Date End Date Taking? Authorizing Provider  albuterol (PROVENTIL HFA;VENTOLIN HFA) 108 (90 BASE) MCG/ACT inhaler Inhale 1-2 puffs into the lungs every 6 (six) hours as needed for wheezing or shortness of breath. Patient not taking: Reported on 07/04/2015 06/28/14   Lindell Spar I, NP  atorvastatin (LIPITOR) 20 MG tablet Take 1 tablet (20 mg total) by mouth daily. For high cholesterol Patient not taking: Reported on 07/19/2015 06/28/14   Lindell Spar I, NP  cloNIDine (CATAPRES) 0.1 MG tablet Take 1 tablet (0.1 mg total) by mouth 2 (two) times daily. For high  blood pressure 06/29/14   Nwoko, Herbert Pun I, NP  fluticasone (FLONASE) 50 MCG/ACT nasal spray Place 1 spray into both nostrils daily.    [provider]  Ginkgo Biloba 40 MG TABS Take by mouth daily.    [provider]  hydrochlorothiazide (HYDRODIURIL) 25 MG tablet Take 25 mg by mouth as needed.    [provider]  hydrOXYzine (VISTARIL) 25 MG capsule Take 25 mg by mouth every 8 (eight) hours as needed. Reported on 07/19/2015    [provider]  levothyroxine (SYNTHROID, LEVOTHROID) 137 MCG tablet  Take 1 tablet (137 mcg total) by mouth daily before breakfast. For low thyroid function 06/28/14   Lindell Spar I, NP  loratadine (CLARITIN) 10 MG tablet Take 10 mg by mouth daily.    [provider]  LORazepam (ATIVAN) 1 MG tablet  06/05/15   [provider]  Multiple Vitamin (MULTIVITAMIN) tablet Take 1 tablet by mouth daily.    [provider]  Omega-3 Fatty Acids (OMEGA 3 PO) Take 1,000 mg by mouth daily.    [provider]  OVER THE COUNTER MEDICATION [place] ginseng 100 mg    [provider]  OVER THE COUNTER MEDICATION Take 1 capsule by mouth daily. Relax and sleep    [provider]  oxybutynin (DITROPAN-XL) 5 MG 24 hr tablet Take 5 mg by mouth at bedtime.    [provider]  sulfamethoxazole-trimethoprim (BACTRIM DS,SEPTRA DS) 800-160 MG tablet Take 1 tablet by mouth 2 (two) times daily. 02/09/17   Lisa Roca, MD  Vitamin D, Cholecalciferol, 400 units CAPS Take 400 Units/day by mouth daily.    [provider]  vitamin E (VITAMIN E) 400 UNIT capsule Take 400 Units by mouth daily.    [provider]    Allergies  Allergen Reactions  . Codeine Other (See Comments)    Hallucinations and jittery  . Cephalexin Rash    Family History  Problem Relation Age of Onset  . Colon cancer Neg Hx   . Colon polyps Neg Hx   . Esophageal cancer Neg Hx   . Rectal cancer Neg Hx   . Stomach cancer Neg Hx     Social History Social History   Tobacco Use  . Smoking status: Current Some Day Smoker    Packs/day: 0.10  . Smokeless tobacco: Never Used  . Tobacco comment: 0-2 cigs a day   Substance Use Topics  . Alcohol use: No    Alcohol/week: 0.0 oz    Frequency: Never    Comment: OCCASSIONALLY  . Drug use: No    Review of Systems  Constitutional: Negative for fever. Eyes: Negative for visual changes. ENT: Negative for sore throat. Cardiovascular: Negative for chest pain. Respiratory: Negative for shortness  of breath. Gastrointestinal: Negative for abdominal pain, vomiting and diarrhea. Genitourinary: Positive for dysuria. Musculoskeletal: Left low back/left hip pain about 3 or 4 days ago which is now gone/resolved.   Skin: Negative for rash. Neurological: Negative for headache.  ____________________________________________   PHYSICAL EXAM:  VITAL SIGNS: ED Triage Vitals [02/09/17 0506]  Enc Vitals Group     BP      Pulse      Resp      Temp      Temp src      SpO2      Weight 179 lb (81.2 kg)     Height 5\' 1"  (1.549 m)     Head Circumference      Peak Flow  Pain Score 5     Pain Loc      Pain Edu?      Excl. in [place]?      Constitutional: Alert and oriented. Well appearing and in no distress. HEENT   Head: Normocephalic and atraumatic.      Eyes: Conjunctivae are normal. Pupils equal and round.       Ears:         Nose: No congestion/rhinnorhea.   Mouth/Throat: Mucous membranes are moist.   Neck: No stridor. Cardiovascular/Chest: Normal rate, regular rhythm.  No murmurs, rubs, or gallops. Respiratory: Normal respiratory effort without tachypnea nor retractions. Breath sounds are clear and equal bilaterally. No wheezes/rales/rhonchi. Gastrointestinal: Soft. No distention, no guarding, no rebound. Nontender.    Genitourinary/rectal: Small nonthrombosed external hemorrhoid.  No saddle anesthesia.  Rectal tone normal. Musculoskeletal: Nontender with normal range of motion in all extremities. No joint effusions.  No lower extremity tenderness.  No edema. Neurologic:  Normal speech and language. No gross or focal neurologic deficits are appreciated. Skin:  Skin is warm, dry and intact. No rash noted. Psychiatric: Mood and affect are normal. Speech and behavior are normal. Patient exhibits appropriate insight and judgment.   ____________________________________________  LABS (pertinent positives/negatives) I, Lisa Roca, MD the attending physician have reviewed  the labs noted below.  Labs Reviewed - No data to display  Reviewed urinalysis from last night, showing rare bacteria and trace leukocytes with 0-5 squamous cells and otherwise negative for red blood cells. __________________________________________  PROCEDURES  Procedure(s) performed: None  Critical Care performed: None   ____________________________________________  ED COURSE / ASSESSMENT AND PLAN  Pertinent labs & imaging results that were available during my care of the patient were reviewed by me and considered in my medical decision making (see chart for details).    Patient is overall well-appearing and complaining of dysuria and frequency of urination with difficulty getting to the bathroom before she urinates for about 2 days now.  She presented to the emergency department after being told over the phone by the physician assistant to be evaluated for possible neurologic emergency with respect to recent diagnosis of sciatica, although the sciatica discomfort and back pain are resolved at this point.  Patient has no neurologic deficits, and when she describes her urinary symptoms it sounds like frequency and urgency and hesitancy followed by difficulty getting to the bathroom in time, rather than a true incontinence.  We discussed that her symptoms seem more consistent with a urinary tract infection than a neurologic emergency of cauda equina.  Her urinalysis from last night looks consistent with urinary tract infection and she has an allergy to Keflex.  I will place her on Bactrim.  First dose given here.  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  I am not suspicious of cauda equina clinically.  We certainly discussed at length symptoms of cauda equina, patient feels comfortable treating for urinary tract infection.  We discussed that if her primary doctor is interested in her having an MRI for routine follow-up of bulging disks that would be an  outpatient indication.  Right now I do not feel there is an indication for emergency MRI as I do not have evidence for cauda equina.   DIFFERENTIAL DIAGNOSIS: Including but not limited to kidney stone, urinary tract infection, pyelonephritis, cauda equina, bladder spasm, etc.  CONSULTATIONS:   None   Patient / Family / Caregiver informed of clinical course, medical decision-making process, and agree with  plan.   I discussed return precautions, follow-up instructions, and discharge instructions with patient and/or family.  Discharge Instructions :  You are evaluated for frequency and urgency with urination and found to have evidence of urinary tract infection on your urinalysis from last evening.  As we discussed, I do not see any symptoms for neurologic emergency at this point in time.  Return to the emergency department immediately for any numbness of the groin or vaginal or rectal area, incontinence including stool or urine where you were unaware that you had gone to the bathroom on yourself, or certainly any new or worsening or uncontrolled back or flank pain.  Return to emergency department for any fevers, abdominal pain, or any other symptoms concerning to you.    ___________________________________________   FINAL CLINICAL IMPRESSION(S) / ED DIAGNOSES   Final diagnoses:  Urinary tract infection without hematuria, site unspecified      ___________________________________________        Note: This dictation was prepared with Dragon dictation. Any transcriptional errors that result from this process are unintentional    Lisa Roca, MD 02/09/17 [id number]

## 2017-02-09 NOTE — ED Triage Notes (Signed)
Pt presents to ED with c/o of frequent urination x 2 days. Pt reports that she has had sciatica which was diagnosed x 2 months ago. Pt reports that her PCP recommended that she come in to the ED for an MRI due to his concern that she has "something wrong with the nerves". Pt denies any loss of bowel function. Pt is ambulatory to triage with NAD.

## 2017-02-09 NOTE — ED Notes (Signed)
[facility] called to say the patient is in their ed

## 2017-02-09 NOTE — Discharge Instructions (Signed)
You are evaluated for frequency and urgency with urination and found to have evidence of urinary tract infection on your urinalysis from last evening.  As we discussed, I do not see any symptoms for neurologic emergency at this point in time.  Return to the emergency department immediately for any numbness of the groin or vaginal or rectal area, incontinence including stool or urine where you were unaware that you had gone to the bathroom on yourself, or certainly any new or worsening or uncontrolled back or flank pain.  Return to emergency department for any fevers, abdominal pain, or any other symptoms concerning to you.

## 2017-02-09 NOTE — ED Triage Notes (Signed)
Patient resting quietly with eyes closed. In no acute distress at this time.

## 2017-02-09 NOTE — ED Notes (Signed)

## 2017-02-10 LAB — URINE CULTURE

## 2017-04-03 DIAGNOSIS — I10 Essential (primary) hypertension: Secondary | ICD-10-CM | POA: Diagnosis not present

## 2017-04-03 DIAGNOSIS — H524 Presbyopia: Secondary | ICD-10-CM | POA: Diagnosis not present

## 2017-04-03 DIAGNOSIS — E7800 Pure hypercholesterolemia, unspecified: Secondary | ICD-10-CM | POA: Diagnosis not present

## 2017-04-23 DIAGNOSIS — E0500 Thyrotoxicosis with diffuse goiter without thyrotoxic crisis or storm: Secondary | ICD-10-CM | POA: Diagnosis not present

## 2017-04-23 DIAGNOSIS — R82998 Other abnormal findings in urine: Secondary | ICD-10-CM | POA: Diagnosis not present

## 2017-04-23 DIAGNOSIS — E7849 Other hyperlipidemia: Secondary | ICD-10-CM | POA: Diagnosis not present

## 2017-04-23 DIAGNOSIS — I10 Essential (primary) hypertension: Secondary | ICD-10-CM | POA: Diagnosis not present

## 2017-04-23 DIAGNOSIS — R7309 Other abnormal glucose: Secondary | ICD-10-CM | POA: Diagnosis not present

## 2017-04-23 DIAGNOSIS — M859 Disorder of bone density and structure, unspecified: Secondary | ICD-10-CM | POA: Diagnosis not present

## 2017-04-30 DIAGNOSIS — R0789 Other chest pain: Secondary | ICD-10-CM | POA: Diagnosis not present

## 2017-04-30 DIAGNOSIS — H9190 Unspecified hearing loss, unspecified ear: Secondary | ICD-10-CM | POA: Diagnosis not present

## 2017-04-30 DIAGNOSIS — M545 Low back pain: Secondary | ICD-10-CM | POA: Diagnosis not present

## 2017-04-30 DIAGNOSIS — F1020 Alcohol dependence, uncomplicated: Secondary | ICD-10-CM | POA: Diagnosis not present

## 2017-04-30 DIAGNOSIS — F1320 Sedative, hypnotic or anxiolytic dependence, uncomplicated: Secondary | ICD-10-CM | POA: Diagnosis not present

## 2017-04-30 DIAGNOSIS — M859 Disorder of bone density and structure, unspecified: Secondary | ICD-10-CM | POA: Diagnosis not present

## 2017-04-30 DIAGNOSIS — R7309 Other abnormal glucose: Secondary | ICD-10-CM | POA: Diagnosis not present

## 2017-04-30 DIAGNOSIS — Z0000 Encounter for general adult medical examination without abnormal findings: Secondary | ICD-10-CM | POA: Diagnosis not present

## 2017-04-30 DIAGNOSIS — F3341 Major depressive disorder, recurrent, in partial remission: Secondary | ICD-10-CM | POA: Diagnosis not present

## 2017-06-04 DIAGNOSIS — Z1212 Encounter for screening for malignant neoplasm of rectum: Secondary | ICD-10-CM | POA: Diagnosis not present

## 2017-08-29 DIAGNOSIS — I10 Essential (primary) hypertension: Secondary | ICD-10-CM | POA: Diagnosis not present

## 2017-08-29 DIAGNOSIS — Z6834 Body mass index (BMI) 34.0-34.9, adult: Secondary | ICD-10-CM | POA: Diagnosis not present

## 2017-08-29 DIAGNOSIS — R0789 Other chest pain: Secondary | ICD-10-CM | POA: Diagnosis not present

## 2017-10-17 DIAGNOSIS — M7542 Impingement syndrome of left shoulder: Secondary | ICD-10-CM | POA: Diagnosis not present

## 2017-10-17 DIAGNOSIS — M25519 Pain in unspecified shoulder: Secondary | ICD-10-CM | POA: Diagnosis not present

## 2017-10-17 DIAGNOSIS — M25512 Pain in left shoulder: Secondary | ICD-10-CM | POA: Diagnosis not present

## 2017-11-05 DIAGNOSIS — E038 Other specified hypothyroidism: Secondary | ICD-10-CM | POA: Diagnosis not present

## 2017-11-05 DIAGNOSIS — R7309 Other abnormal glucose: Secondary | ICD-10-CM | POA: Diagnosis not present

## 2017-11-05 DIAGNOSIS — F1320 Sedative, hypnotic or anxiolytic dependence, uncomplicated: Secondary | ICD-10-CM | POA: Diagnosis not present

## 2017-11-05 DIAGNOSIS — Z9189 Other specified personal risk factors, not elsewhere classified: Secondary | ICD-10-CM | POA: Diagnosis not present

## 2017-11-05 DIAGNOSIS — M859 Disorder of bone density and structure, unspecified: Secondary | ICD-10-CM | POA: Diagnosis not present

## 2017-11-05 DIAGNOSIS — Z23 Encounter for immunization: Secondary | ICD-10-CM | POA: Diagnosis not present

## 2017-11-05 DIAGNOSIS — R413 Other amnesia: Secondary | ICD-10-CM | POA: Diagnosis not present

## 2017-11-05 DIAGNOSIS — M25512 Pain in left shoulder: Secondary | ICD-10-CM | POA: Diagnosis not present

## 2017-11-05 DIAGNOSIS — I10 Essential (primary) hypertension: Secondary | ICD-10-CM | POA: Diagnosis not present

## 2017-11-15 DIAGNOSIS — M7542 Impingement syndrome of left shoulder: Secondary | ICD-10-CM | POA: Diagnosis not present

## 2017-11-15 DIAGNOSIS — M25512 Pain in left shoulder: Secondary | ICD-10-CM | POA: Diagnosis not present

## 2017-11-20 DIAGNOSIS — F329 Major depressive disorder, single episode, unspecified: Secondary | ICD-10-CM | POA: Diagnosis not present

## 2017-11-20 DIAGNOSIS — F419 Anxiety disorder, unspecified: Secondary | ICD-10-CM | POA: Diagnosis not present

## 2017-12-02 ENCOUNTER — Other Ambulatory Visit: Payer: Self-pay | Admitting: Internal Medicine

## 2017-12-02 DIAGNOSIS — Z1231 Encounter for screening mammogram for malignant neoplasm of breast: Secondary | ICD-10-CM

## 2018-01-08 ENCOUNTER — Ambulatory Visit: Payer: Medicare HMO

## 2018-02-06 ENCOUNTER — Ambulatory Visit
Admission: RE | Admit: 2018-02-06 | Discharge: 2018-02-06 | Disposition: A | Discharge status: Home / Self Care | Payer: Medicare HMO | Source: Ambulatory Visit | Attending: Internal Medicine | Admitting: Internal Medicine

## 2018-02-06 DIAGNOSIS — Z1231 Encounter for screening mammogram for malignant neoplasm of breast: Secondary | ICD-10-CM | POA: Diagnosis not present

## 2018-05-02 DIAGNOSIS — E0500 Thyrotoxicosis with diffuse goiter without thyrotoxic crisis or storm: Secondary | ICD-10-CM | POA: Diagnosis not present

## 2018-05-02 DIAGNOSIS — M859 Disorder of bone density and structure, unspecified: Secondary | ICD-10-CM | POA: Diagnosis not present

## 2018-05-02 DIAGNOSIS — I10 Essential (primary) hypertension: Secondary | ICD-10-CM | POA: Diagnosis not present

## 2018-05-02 DIAGNOSIS — E7849 Other hyperlipidemia: Secondary | ICD-10-CM | POA: Diagnosis not present

## 2018-05-02 DIAGNOSIS — R739 Hyperglycemia, unspecified: Secondary | ICD-10-CM | POA: Diagnosis not present

## 2018-05-08 DIAGNOSIS — M8580 Other specified disorders of bone density and structure, unspecified site: Secondary | ICD-10-CM | POA: Diagnosis not present

## 2018-05-08 DIAGNOSIS — M25512 Pain in left shoulder: Secondary | ICD-10-CM | POA: Diagnosis not present

## 2018-05-08 DIAGNOSIS — Z1331 Encounter for screening for depression: Secondary | ICD-10-CM | POA: Diagnosis not present

## 2018-05-08 DIAGNOSIS — F17210 Nicotine dependence, cigarettes, uncomplicated: Secondary | ICD-10-CM | POA: Diagnosis not present

## 2018-05-08 DIAGNOSIS — D649 Anemia, unspecified: Secondary | ICD-10-CM | POA: Diagnosis not present

## 2018-05-08 DIAGNOSIS — F329 Major depressive disorder, single episode, unspecified: Secondary | ICD-10-CM | POA: Diagnosis not present

## 2018-05-08 DIAGNOSIS — M545 Low back pain: Secondary | ICD-10-CM | POA: Diagnosis not present

## 2018-05-08 DIAGNOSIS — Z0000 Encounter for general adult medical examination without abnormal findings: Secondary | ICD-10-CM | POA: Diagnosis not present

## 2018-05-08 DIAGNOSIS — R413 Other amnesia: Secondary | ICD-10-CM | POA: Diagnosis not present

## 2018-05-08 DIAGNOSIS — L659 Nonscarring hair loss, unspecified: Secondary | ICD-10-CM | POA: Diagnosis not present

## 2018-09-22 DIAGNOSIS — F419 Anxiety disorder, unspecified: Secondary | ICD-10-CM | POA: Diagnosis not present

## 2018-09-22 DIAGNOSIS — F329 Major depressive disorder, single episode, unspecified: Secondary | ICD-10-CM | POA: Diagnosis not present

## 2018-09-22 DIAGNOSIS — F1020 Alcohol dependence, uncomplicated: Secondary | ICD-10-CM | POA: Diagnosis not present

## 2018-09-24 DIAGNOSIS — M76821 Posterior tibial tendinitis, right leg: Secondary | ICD-10-CM | POA: Diagnosis not present

## 2018-11-11 DIAGNOSIS — M1990 Unspecified osteoarthritis, unspecified site: Secondary | ICD-10-CM | POA: Diagnosis not present

## 2018-11-11 DIAGNOSIS — I10 Essential (primary) hypertension: Secondary | ICD-10-CM | POA: Diagnosis not present

## 2018-11-11 DIAGNOSIS — F1020 Alcohol dependence, uncomplicated: Secondary | ICD-10-CM | POA: Diagnosis not present

## 2018-11-11 DIAGNOSIS — F419 Anxiety disorder, unspecified: Secondary | ICD-10-CM | POA: Diagnosis not present

## 2018-11-11 DIAGNOSIS — E039 Hypothyroidism, unspecified: Secondary | ICD-10-CM | POA: Diagnosis not present

## 2018-11-11 DIAGNOSIS — F17210 Nicotine dependence, cigarettes, uncomplicated: Secondary | ICD-10-CM | POA: Diagnosis not present

## 2018-11-11 DIAGNOSIS — F1320 Sedative, hypnotic or anxiolytic dependence, uncomplicated: Secondary | ICD-10-CM | POA: Diagnosis not present

## 2018-11-11 DIAGNOSIS — E669 Obesity, unspecified: Secondary | ICD-10-CM | POA: Diagnosis not present

## 2018-11-11 DIAGNOSIS — F3341 Major depressive disorder, recurrent, in partial remission: Secondary | ICD-10-CM | POA: Diagnosis not present

## 2018-12-05 DIAGNOSIS — E038 Other specified hypothyroidism: Secondary | ICD-10-CM | POA: Diagnosis not present

## 2018-12-05 DIAGNOSIS — Z23 Encounter for immunization: Secondary | ICD-10-CM | POA: Diagnosis not present

## 2019-02-03 ENCOUNTER — Other Ambulatory Visit: Payer: Self-pay | Admitting: Internal Medicine

## 2019-02-03 DIAGNOSIS — Z1231 Encounter for screening mammogram for malignant neoplasm of breast: Secondary | ICD-10-CM

## 2019-03-11 ENCOUNTER — Other Ambulatory Visit: Payer: Self-pay

## 2019-03-11 ENCOUNTER — Ambulatory Visit
Admission: RE | Admit: 2019-03-11 | Discharge: 2019-03-11 | Disposition: A | Discharge status: Home / Self Care | Payer: Medicare HMO | Source: Ambulatory Visit | Attending: Internal Medicine | Admitting: Internal Medicine

## 2019-03-11 DIAGNOSIS — Z1231 Encounter for screening mammogram for malignant neoplasm of breast: Secondary | ICD-10-CM

## 2019-03-24 DIAGNOSIS — F3341 Major depressive disorder, recurrent, in partial remission: Secondary | ICD-10-CM | POA: Diagnosis not present

## 2019-03-24 DIAGNOSIS — M79671 Pain in right foot: Secondary | ICD-10-CM | POA: Diagnosis not present

## 2019-03-24 DIAGNOSIS — E039 Hypothyroidism, unspecified: Secondary | ICD-10-CM | POA: Diagnosis not present

## 2019-03-24 DIAGNOSIS — R296 Repeated falls: Secondary | ICD-10-CM | POA: Diagnosis not present

## 2019-03-24 DIAGNOSIS — E669 Obesity, unspecified: Secondary | ICD-10-CM | POA: Diagnosis not present

## 2019-03-24 DIAGNOSIS — I10 Essential (primary) hypertension: Secondary | ICD-10-CM | POA: Diagnosis not present

## 2019-03-24 DIAGNOSIS — F1020 Alcohol dependence, uncomplicated: Secondary | ICD-10-CM | POA: Diagnosis not present

## 2019-03-24 DIAGNOSIS — R413 Other amnesia: Secondary | ICD-10-CM | POA: Diagnosis not present

## 2019-03-24 DIAGNOSIS — F17210 Nicotine dependence, cigarettes, uncomplicated: Secondary | ICD-10-CM | POA: Diagnosis not present

## 2019-04-07 ENCOUNTER — Ambulatory Visit (INDEPENDENT_AMBULATORY_CARE_PROVIDER_SITE_OTHER): Payer: Medicare HMO

## 2019-04-07 ENCOUNTER — Other Ambulatory Visit: Payer: Self-pay | Admitting: Podiatry

## 2019-04-07 ENCOUNTER — Other Ambulatory Visit: Payer: Self-pay

## 2019-04-07 ENCOUNTER — Ambulatory Visit: Payer: Medicare HMO | Admitting: Podiatry

## 2019-04-07 DIAGNOSIS — M722 Plantar fascial fibromatosis: Secondary | ICD-10-CM

## 2019-04-07 DIAGNOSIS — M76821 Posterior tibial tendinitis, right leg: Secondary | ICD-10-CM | POA: Diagnosis not present

## 2019-04-07 DIAGNOSIS — M79671 Pain in right foot: Secondary | ICD-10-CM

## 2019-04-08 ENCOUNTER — Encounter: Payer: Self-pay | Admitting: Podiatry

## 2019-04-08 NOTE — Progress Notes (Signed)
Subjective:  Patient ID: Melissa Wolfe, female    DOB: June 01, 1950,  MRN: [id number]  Chief Complaint  Patient presents with  . Foot Pain    pt is here for right foot pain, located on the medial side of the right foot, pain has been going on for about a year, pt is overall concerned with her loss of balance, pain scale is an 8 out of 10 on the pain scale.    69 y.o. female presents with the above complaint.  Patient presents with right medial side foot pain that has been going on for about a year.  Patient has tried Biofreeze and Aleve but has not helped much.  She states that she is also concerned about her loss of balance due to the pain that she is getting.  Her pain scale is 8 out of 10.  She states is more throb E achy in nature.  She has not seen anyone else for this.  She states that she is also a little bit of a flatfoot that might be attributing to the pain.  She denies any other acute complaints.   Review of Systems: Negative except as noted in the HPI. Denies N/V/F/Ch.  Past Medical History:  Diagnosis Date  . Alcoholism ([place])   . Allergy   . Depression   . Hyperlipemia   . Hypertension   . Hypothyroid     Current Outpatient Medications:  .  albuterol (PROVENTIL HFA;VENTOLIN HFA) 108 (90 BASE) MCG/ACT inhaler, Inhale 1-2 puffs into the lungs every 6 (six) hours as needed for wheezing or shortness of breath., Disp: 1 Inhaler, Rfl: 0 .  atorvastatin (LIPITOR) 20 MG tablet, Take 1 tablet (20 mg total) by mouth daily. For high cholesterol, Disp: , Rfl:  .  cloNIDine (CATAPRES) 0.1 MG tablet, Take 1 tablet (0.1 mg total) by mouth 2 (two) times daily. For high blood pressure, Disp: 6 tablet, Rfl: 0 .  fluticasone (FLONASE) 50 MCG/ACT nasal spray, Place 1 spray into both nostrils daily., Disp: , Rfl:  .  Ginkgo Biloba 40 MG TABS, Take by mouth daily., Disp: , Rfl:  .  hydrochlorothiazide (HYDRODIURIL) 25 MG tablet, Take 25 mg by mouth as needed., Disp: , Rfl:  .  hydrOXYzine  (VISTARIL) 25 MG capsule, Take 25 mg by mouth every 8 (eight) hours as needed. Reported on 07/19/2015, Disp: , Rfl:  .  levothyroxine (SYNTHROID, LEVOTHROID) 137 MCG tablet, Take 1 tablet (137 mcg total) by mouth daily before breakfast. For low thyroid function, Disp: , Rfl:  .  loratadine (CLARITIN) 10 MG tablet, Take 10 mg by mouth daily., Disp: , Rfl:  .  LORazepam (ATIVAN) 1 MG tablet, , Disp: , Rfl:  .  Multiple Vitamin (MULTIVITAMIN) tablet, Take 1 tablet by mouth daily., Disp: , Rfl:  .  Omega-3 Fatty Acids (OMEGA 3 PO), Take 1,000 mg by mouth daily., Disp: , Rfl:  .  OVER THE COUNTER MEDICATION, [place] ginseng 100 mg, Disp: , Rfl:  .  OVER THE COUNTER MEDICATION, Take 1 capsule by mouth daily. Relax and sleep, Disp: , Rfl:  .  oxybutynin (DITROPAN-XL) 5 MG 24 hr tablet, Take 5 mg by mouth at bedtime., Disp: , Rfl:  .  sulfamethoxazole-trimethoprim (BACTRIM DS,SEPTRA DS) 800-160 MG tablet, Take 1 tablet by mouth 2 (two) times daily., Disp: 14 tablet, Rfl: 0 .  Vitamin D, Cholecalciferol, 400 units CAPS, Take 400 Units/day by mouth daily., Disp: , Rfl:  .  vitamin E (VITAMIN E) 400  UNIT capsule, Take 400 Units by mouth daily., Disp: , Rfl:   Social History   Tobacco Use  Smoking Status Current Some Day Smoker  . Packs/day: 0.10  Smokeless Tobacco Never Used  Tobacco Comment   0-2 cigs a day     Allergies  Allergen Reactions  . Codeine Other (See Comments)    Hallucinations and jittery  . Cephalexin Rash   Objective:  There were no vitals filed for this visit. There is no height or weight on file to calculate BMI. Constitutional Well developed. Well nourished.  Vascular Dorsalis pedis pulses palpable bilaterally. Posterior tibial pulses palpable bilaterally. Capillary refill normal to all digits.  No cyanosis or clubbing noted. Pedal hair growth normal.  Neurologic Normal speech. Oriented to person, place, and time. Epicritic sensation to light touch grossly present  bilaterally.  Dermatologic Nails well groomed and normal in appearance. No open wounds. No skin lesions.  Orthopedic:  Pain on palpation to the course of the posterior tibial tendon including its insertion at the navicular tuberosity.  No pain at the ATFL peroneal or Achilles tendon.  No pain with ankle range of motion.  Pain with resisted inversion as well as eversion of the foot active and passive.  No pain with dorsiflexion.  Mild pain with plantar flexion and supination of the foot.   Radiographs: 3 views of skeletally mature adult foot: There is decreasing calcaneal inclination angle increase in talar declination angle anterior break in the cyma line.  No elevatus noted.  Hammertoe contractures of digits 2 through 4 noted with rotational component as well.  Mild navicular exostosis noted.  No other bony abnormalities identified.  No stress fractures noted.  Assessment:   1. Foot pain, right   2. Posterior tibial tendinitis of right leg    Plan:  Patient was evaluated and treated and all questions answered.  Right posterior tibial tendinitis -I explained to the patient the etiology of posterior tibial tendinitis and various treatment options were discussed.  I related the posterior tibial tendinitis with its relationship to pes planus deformity.  Patient has severe rigid pes planus deformity to bilateral feet especially of the right foot.  Given the amount of inflammation that is present I believe patient will benefit from a steroid injection to help decrease the inflammatory component associated with the tendinitis.  I also discussed with the patient that there is a high risk of tendon rupture associated with a steroid.  Patient understands the risk and would like to proceed with injection regardless of the risks. -Also discussed with her the importance of shoe gear modification and to wear supportive sneakers as this can put less stress on the posterior tibial tendon..  Patient states  understanding will obtain new sneakers. -Also believe she will benefit from plantar fascial brace to help hold the arch of the foot to take the stress off of the posterior tibial tendon indirectly. -I encouraged her to wear the plantar fascial brace at all times when ambulating.  Plantar fascial brace was dispensed. -A steroid injection was performed at right medial foot at the point of maximal tenderness using 1% plain Lidocaine and 10 mg of Kenalog. This was well tolerated.   No follow-ups on file.

## 2019-04-10 DIAGNOSIS — F419 Anxiety disorder, unspecified: Secondary | ICD-10-CM | POA: Diagnosis not present

## 2019-04-10 DIAGNOSIS — F329 Major depressive disorder, single episode, unspecified: Secondary | ICD-10-CM | POA: Diagnosis not present

## 2019-04-27 DIAGNOSIS — Z0000 Encounter for general adult medical examination without abnormal findings: Secondary | ICD-10-CM | POA: Diagnosis not present

## 2019-04-27 DIAGNOSIS — M859 Disorder of bone density and structure, unspecified: Secondary | ICD-10-CM | POA: Diagnosis not present

## 2019-04-27 DIAGNOSIS — E7849 Other hyperlipidemia: Secondary | ICD-10-CM | POA: Diagnosis not present

## 2019-04-27 DIAGNOSIS — R739 Hyperglycemia, unspecified: Secondary | ICD-10-CM | POA: Diagnosis not present

## 2019-04-27 DIAGNOSIS — E038 Other specified hypothyroidism: Secondary | ICD-10-CM | POA: Diagnosis not present

## 2019-05-05 ENCOUNTER — Ambulatory Visit: Payer: Medicare HMO | Admitting: Podiatry

## 2019-05-05 ENCOUNTER — Other Ambulatory Visit: Payer: Self-pay

## 2019-05-05 DIAGNOSIS — M79671 Pain in right foot: Secondary | ICD-10-CM | POA: Diagnosis not present

## 2019-05-05 DIAGNOSIS — Q666 Other congenital valgus deformities of feet: Secondary | ICD-10-CM

## 2019-05-05 DIAGNOSIS — M76821 Posterior tibial tendinitis, right leg: Secondary | ICD-10-CM | POA: Diagnosis not present

## 2019-05-06 ENCOUNTER — Encounter: Payer: Self-pay | Admitting: Podiatry

## 2019-05-06 NOTE — Progress Notes (Signed)
Subjective:  Patient ID: Melissa Wolfe, female    DOB: 05-Feb-1950,  MRN: [id number]  Chief Complaint  Patient presents with  . Foot Pain    pt is here for 4 week f/u of the right foot, pt also states that the right foot is painful to the touch.    69 y.o. female presents with the above complaint.  Patient presents with a follow-up of right posterior tibial tendinitis.  Patient states that she is doing really well.  The injection completely relieved her pain.  At this time she does not have any pain.  At its worst she may get 1 out of 10.  She has been doing really well.  She obtain new pair of sneakers.  She would like to discuss long-term management.   Review of Systems: Negative except as noted in the HPI. Denies N/V/F/Ch.  Past Medical History:  Diagnosis Date  . Alcoholism ([place])   . Allergy   . Depression   . Hyperlipemia   . Hypertension   . Hypothyroid     Current Outpatient Medications:  .  albuterol (PROVENTIL HFA;VENTOLIN HFA) 108 (90 BASE) MCG/ACT inhaler, Inhale 1-2 puffs into the lungs every 6 (six) hours as needed for wheezing or shortness of breath., Disp: 1 Inhaler, Rfl: 0 .  atorvastatin (LIPITOR) 20 MG tablet, Take 1 tablet (20 mg total) by mouth daily. For high cholesterol, Disp: , Rfl:  .  cloNIDine (CATAPRES) 0.1 MG tablet, Take 1 tablet (0.1 mg total) by mouth 2 (two) times daily. For high blood pressure, Disp: 6 tablet, Rfl: 0 .  fluticasone (FLONASE) 50 MCG/ACT nasal spray, Place 1 spray into both nostrils daily., Disp: , Rfl:  .  Ginkgo Biloba 40 MG TABS, Take by mouth daily., Disp: , Rfl:  .  hydrochlorothiazide (HYDRODIURIL) 25 MG tablet, Take 25 mg by mouth as needed., Disp: , Rfl:  .  hydrOXYzine (VISTARIL) 25 MG capsule, Take 25 mg by mouth every 8 (eight) hours as needed. Reported on 07/19/2015, Disp: , Rfl:  .  levothyroxine (SYNTHROID, LEVOTHROID) 137 MCG tablet, Take 1 tablet (137 mcg total) by mouth daily before breakfast. For low thyroid  function, Disp: , Rfl:  .  loratadine (CLARITIN) 10 MG tablet, Take 10 mg by mouth daily., Disp: , Rfl:  .  LORazepam (ATIVAN) 1 MG tablet, , Disp: , Rfl:  .  Multiple Vitamin (MULTIVITAMIN) tablet, Take 1 tablet by mouth daily., Disp: , Rfl:  .  Omega-3 Fatty Acids (OMEGA 3 PO), Take 1,000 mg by mouth daily., Disp: , Rfl:  .  OVER THE COUNTER MEDICATION, [place] ginseng 100 mg, Disp: , Rfl:  .  OVER THE COUNTER MEDICATION, Take 1 capsule by mouth daily. Relax and sleep, Disp: , Rfl:  .  oxybutynin (DITROPAN-XL) 5 MG 24 hr tablet, Take 5 mg by mouth at bedtime., Disp: , Rfl:  .  sulfamethoxazole-trimethoprim (BACTRIM DS,SEPTRA DS) 800-160 MG tablet, Take 1 tablet by mouth 2 (two) times daily., Disp: 14 tablet, Rfl: 0 .  Vitamin D, Cholecalciferol, 400 units CAPS, Take 400 Units/day by mouth daily., Disp: , Rfl:  .  vitamin E (VITAMIN E) 400 UNIT capsule, Take 400 Units by mouth daily., Disp: , Rfl:   Social History   Tobacco Use  Smoking Status Current Some Day Smoker  . Packs/day: 0.10  Smokeless Tobacco Never Used  Tobacco Comment   0-2 cigs a day     Allergies  Allergen Reactions  . Codeine Other (See Comments)  Hallucinations and jittery  . Cephalexin Rash   Objective:  There were no vitals filed for this visit. There is no height or weight on file to calculate BMI. Constitutional Well developed. Well nourished.  Vascular Dorsalis pedis pulses palpable bilaterally. Posterior tibial pulses palpable bilaterally. Capillary refill normal to all digits.  No cyanosis or clubbing noted. Pedal hair growth normal.  Neurologic Normal speech. Oriented to person, place, and time. Epicritic sensation to light touch grossly present bilaterally.  Dermatologic Nails well groomed and normal in appearance. No open wounds. No skin lesions.  Orthopedic:  No pain on palpation to the course of the posterior tibial tendon including its insertion at the navicular tuberosity.  No pain at the  ATFL peroneal or Achilles tendon.  No pain with ankle range of motion.  No pain with resisted inversion as well as eversion of the foot active and passive.  No pain with dorsiflexion.  No pain with plantar flexion and supination of the foot.   Radiographs: 3 views of skeletally mature adult foot: There is decreasing calcaneal inclination angle increase in talar declination angle anterior break in the cyma line.  No elevatus noted.  Hammertoe contractures of digits 2 through 4 noted with rotational component as well.  Mild navicular exostosis noted.  No other bony abnormalities identified.  No stress fractures noted.  Assessment:   1. Posterior tibial tendinitis of right leg   2. Foot pain, right   3. Pes planovalgus    Plan:  Patient was evaluated and treated and all questions answered.  Right posterior tibial tendinitis -I explained to the patient the etiology of posterior tibial tendinitis and various treatment options were discussed.  I related the posterior tibial tendinitis with its relationship to pes planus deformity.  Patient has severe rigid pes planus deformity to bilateral feet especially of the right foot.  - -Clinically her pain has completely resolved.  However I discussed with her that in the future this pain may come back given that she has pes planus foot type and is putting too much stress on the tendon.  Patient states understanding would like to discuss long-term management. -I discussed with her that she will benefit from custom-made orthotics to help support the arch of the foot and therefore take the stress away from the posterior tibial tendon.  Patient states understanding -She does not need any further injection as her pain is completely resolved.  Bilateral severe pes planus -I explained to the patient the etiology of pes planus and various treatment options were discussed.  I believe patient will benefit from custom-made orthotics to help support the arch of the foot  therefore take the stress off of the posterior tibial tendon and control the hindfoot motion. -She will be scheduled to see [facility] for custom-made orthotics.  No follow-ups on file.

## 2019-05-11 DIAGNOSIS — M79671 Pain in right foot: Secondary | ICD-10-CM | POA: Diagnosis not present

## 2019-05-11 DIAGNOSIS — H04129 Dry eye syndrome of unspecified lacrimal gland: Secondary | ICD-10-CM | POA: Diagnosis not present

## 2019-05-11 DIAGNOSIS — M8580 Other specified disorders of bone density and structure, unspecified site: Secondary | ICD-10-CM | POA: Diagnosis not present

## 2019-05-11 DIAGNOSIS — D649 Anemia, unspecified: Secondary | ICD-10-CM | POA: Diagnosis not present

## 2019-05-11 DIAGNOSIS — R82998 Other abnormal findings in urine: Secondary | ICD-10-CM | POA: Diagnosis not present

## 2019-05-11 DIAGNOSIS — R296 Repeated falls: Secondary | ICD-10-CM | POA: Diagnosis not present

## 2019-05-11 DIAGNOSIS — F419 Anxiety disorder, unspecified: Secondary | ICD-10-CM | POA: Diagnosis not present

## 2019-05-11 DIAGNOSIS — Z1212 Encounter for screening for malignant neoplasm of rectum: Secondary | ICD-10-CM | POA: Diagnosis not present

## 2019-05-11 DIAGNOSIS — F3341 Major depressive disorder, recurrent, in partial remission: Secondary | ICD-10-CM | POA: Diagnosis not present

## 2019-05-11 DIAGNOSIS — L659 Nonscarring hair loss, unspecified: Secondary | ICD-10-CM | POA: Diagnosis not present

## 2019-05-11 DIAGNOSIS — Z0000 Encounter for general adult medical examination without abnormal findings: Secondary | ICD-10-CM | POA: Diagnosis not present

## 2019-06-03 ENCOUNTER — Other Ambulatory Visit: Payer: Self-pay

## 2019-06-03 ENCOUNTER — Ambulatory Visit: Payer: Medicare HMO | Admitting: Orthotics

## 2019-06-03 DIAGNOSIS — Q666 Other congenital valgus deformities of feet: Secondary | ICD-10-CM

## 2019-06-03 DIAGNOSIS — M76821 Posterior tibial tendinitis, right leg: Secondary | ICD-10-CM

## 2019-06-03 NOTE — Progress Notes (Signed)
Patient is asymptomatic of any pain/discomfort today; therefore, she would like to reschedule for 4 weeks f/u to see if she really wants to spend the $$ for CMFO.

## 2019-06-04 DIAGNOSIS — H2513 Age-related nuclear cataract, bilateral: Secondary | ICD-10-CM | POA: Diagnosis not present

## 2019-06-04 DIAGNOSIS — H35033 Hypertensive retinopathy, bilateral: Secondary | ICD-10-CM | POA: Diagnosis not present

## 2019-06-04 DIAGNOSIS — H35013 Changes in retinal vascular appearance, bilateral: Secondary | ICD-10-CM | POA: Diagnosis not present

## 2019-06-04 DIAGNOSIS — H35363 Drusen (degenerative) of macula, bilateral: Secondary | ICD-10-CM | POA: Diagnosis not present

## 2019-06-04 DIAGNOSIS — H524 Presbyopia: Secondary | ICD-10-CM | POA: Diagnosis not present

## 2019-06-04 DIAGNOSIS — H04123 Dry eye syndrome of bilateral lacrimal glands: Secondary | ICD-10-CM | POA: Diagnosis not present

## 2019-06-30 ENCOUNTER — Other Ambulatory Visit: Payer: Self-pay

## 2019-06-30 ENCOUNTER — Ambulatory Visit: Payer: Medicare HMO | Admitting: Podiatry

## 2019-06-30 ENCOUNTER — Encounter: Payer: Self-pay | Admitting: Podiatry

## 2019-06-30 DIAGNOSIS — M79671 Pain in right foot: Secondary | ICD-10-CM | POA: Diagnosis not present

## 2019-06-30 DIAGNOSIS — Q666 Other congenital valgus deformities of feet: Secondary | ICD-10-CM | POA: Diagnosis not present

## 2019-06-30 DIAGNOSIS — M76821 Posterior tibial tendinitis, right leg: Secondary | ICD-10-CM | POA: Diagnosis not present

## 2019-06-30 NOTE — Progress Notes (Signed)
Subjective:  Patient ID: Melissa Wolfe, female    DOB: 02/27/1950,  MRN: [id number]  Chief Complaint  Patient presents with  . Foot Pain    pt is here for a f/u of the right foot, pt states that the right foot is doing a lot better since the last time she was here. Pt has no overall pain. Pt is also able to aply pressure to the right foot.    69 y.o. female presents with the above complaint.  Patient presents with a follow-up of right posterior tibial tendinitis.  Patient states that she is doing really well.  The injection completely relieved her pain.  At this time she does not have any pain.  At its worst she may get 1 out of 10.  She has been doing really well.  She obtain new pair of sneakers.  She would like to discuss long-term management.   Review of Systems: Negative except as noted in the HPI. Denies N/V/F/Ch.  Past Medical History:  Diagnosis Date  . Alcoholism ([place])   . Allergy   . Depression   . Hyperlipemia   . Hypertension   . Hypothyroid     Current Outpatient Medications:  .  albuterol (PROVENTIL HFA;VENTOLIN HFA) 108 (90 BASE) MCG/ACT inhaler, Inhale 1-2 puffs into the lungs every 6 (six) hours as needed for wheezing or shortness of breath., Disp: 1 Inhaler, Rfl: 0 .  atorvastatin (LIPITOR) 20 MG tablet, Take 1 tablet (20 mg total) by mouth daily. For high cholesterol, Disp: , Rfl:  .  cloNIDine (CATAPRES) 0.1 MG tablet, Take 1 tablet (0.1 mg total) by mouth 2 (two) times daily. For high blood pressure, Disp: 6 tablet, Rfl: 0 .  fluticasone (FLONASE) 50 MCG/ACT nasal spray, Place 1 spray into both nostrils daily., Disp: , Rfl:  .  Ginkgo Biloba 40 MG TABS, Take by mouth daily., Disp: , Rfl:  .  hydrochlorothiazide (HYDRODIURIL) 25 MG tablet, Take 25 mg by mouth as needed., Disp: , Rfl:  .  hydrOXYzine (VISTARIL) 25 MG capsule, Take 25 mg by mouth every 8 (eight) hours as needed. Reported on 07/19/2015, Disp: , Rfl:  .  levothyroxine (SYNTHROID, LEVOTHROID) 137  MCG tablet, Take 1 tablet (137 mcg total) by mouth daily before breakfast. For low thyroid function, Disp: , Rfl:  .  loratadine (CLARITIN) 10 MG tablet, Take 10 mg by mouth daily., Disp: , Rfl:  .  LORazepam (ATIVAN) 1 MG tablet, , Disp: , Rfl:  .  Multiple Vitamin (MULTIVITAMIN) tablet, Take 1 tablet by mouth daily., Disp: , Rfl:  .  Omega-3 Fatty Acids (OMEGA 3 PO), Take 1,000 mg by mouth daily., Disp: , Rfl:  .  OVER THE COUNTER MEDICATION, [place] ginseng 100 mg, Disp: , Rfl:  .  OVER THE COUNTER MEDICATION, Take 1 capsule by mouth daily. Relax and sleep, Disp: , Rfl:  .  oxybutynin (DITROPAN-XL) 5 MG 24 hr tablet, Take 5 mg by mouth at bedtime., Disp: , Rfl:  .  sulfamethoxazole-trimethoprim (BACTRIM DS,SEPTRA DS) 800-160 MG tablet, Take 1 tablet by mouth 2 (two) times daily., Disp: 14 tablet, Rfl: 0 .  Vitamin D, Cholecalciferol, 400 units CAPS, Take 400 Units/day by mouth daily., Disp: , Rfl:  .  vitamin E (VITAMIN E) 400 UNIT capsule, Take 400 Units by mouth daily., Disp: , Rfl:   Social History   Tobacco Use  Smoking Status Current Some Day Smoker  . Packs/day: 0.10  Smokeless Tobacco Never Used  Tobacco Comment  0-2 cigs a day     Allergies  Allergen Reactions  . Codeine Other (See Comments)    Hallucinations and jittery  . Cephalexin Rash   Objective:  There were no vitals filed for this visit. There is no height or weight on file to calculate BMI. Constitutional Well developed. Well nourished.  Vascular Dorsalis pedis pulses palpable bilaterally. Posterior tibial pulses palpable bilaterally. Capillary refill normal to all digits.  No cyanosis or clubbing noted. Pedal hair growth normal.  Neurologic Normal speech. Oriented to person, place, and time. Epicritic sensation to light touch grossly present bilaterally.  Dermatologic Nails well groomed and normal in appearance. No open wounds. No skin lesions.  Orthopedic:  No pain on palpation to the course of the  posterior tibial tendon including its insertion at the navicular tuberosity.  No pain at the ATFL peroneal or Achilles tendon.  No pain with ankle range of motion.  No pain with resisted inversion as well as eversion of the foot active and passive.  No pain with dorsiflexion.  No pain with plantar flexion and supination of the foot.   Radiographs: 3 views of skeletally mature adult foot: There is decreasing calcaneal inclination angle increase in talar declination angle anterior break in the cyma line.  No elevatus noted.  Hammertoe contractures of digits 2 through 4 noted with rotational component as well.  Mild navicular exostosis noted.  No other bony abnormalities identified.  No stress fractures noted.  Assessment:   1. Posterior tibial tendinitis of right leg   2. Foot pain, right   3. Pes planovalgus    Plan:  Patient was evaluated and treated and all questions answered.  Right posterior tibial tendinitis -Pain completely resolved.  She is been able to return back to regular activities.  She has seen rec for orthotics will hold off on getting orthotics for now.  However given the structure of her foot with underlying pes planus deformity if her pain recurs we will plan on getting orthotics at that time.  Bilateral severe pes planus -Hold off on getting orthotics for now.  Return if symptoms worsen or fail to improve.

## 2019-07-08 ENCOUNTER — Other Ambulatory Visit: Payer: Medicare HMO | Admitting: Orthotics

## 2019-08-26 DIAGNOSIS — I10 Essential (primary) hypertension: Secondary | ICD-10-CM | POA: Diagnosis not present

## 2019-08-26 DIAGNOSIS — F329 Major depressive disorder, single episode, unspecified: Secondary | ICD-10-CM | POA: Diagnosis not present

## 2019-08-26 DIAGNOSIS — R400 Somnolence: Secondary | ICD-10-CM | POA: Diagnosis not present

## 2019-08-26 DIAGNOSIS — E039 Hypothyroidism, unspecified: Secondary | ICD-10-CM | POA: Diagnosis not present

## 2019-11-10 DIAGNOSIS — F1020 Alcohol dependence, uncomplicated: Secondary | ICD-10-CM | POA: Diagnosis not present

## 2019-11-10 DIAGNOSIS — G4700 Insomnia, unspecified: Secondary | ICD-10-CM | POA: Diagnosis not present

## 2019-11-10 DIAGNOSIS — I10 Essential (primary) hypertension: Secondary | ICD-10-CM | POA: Diagnosis not present

## 2019-11-10 DIAGNOSIS — R739 Hyperglycemia, unspecified: Secondary | ICD-10-CM | POA: Diagnosis not present

## 2019-11-10 DIAGNOSIS — Z23 Encounter for immunization: Secondary | ICD-10-CM | POA: Diagnosis not present

## 2019-11-10 DIAGNOSIS — M8580 Other specified disorders of bone density and structure, unspecified site: Secondary | ICD-10-CM | POA: Diagnosis not present

## 2019-11-10 DIAGNOSIS — E039 Hypothyroidism, unspecified: Secondary | ICD-10-CM | POA: Diagnosis not present

## 2019-11-10 DIAGNOSIS — E785 Hyperlipidemia, unspecified: Secondary | ICD-10-CM | POA: Diagnosis not present

## 2019-11-10 DIAGNOSIS — F1320 Sedative, hypnotic or anxiolytic dependence, uncomplicated: Secondary | ICD-10-CM | POA: Diagnosis not present

## 2020-05-06 DIAGNOSIS — M545 Low back pain, unspecified: Secondary | ICD-10-CM | POA: Diagnosis not present

## 2020-05-09 DIAGNOSIS — E785 Hyperlipidemia, unspecified: Secondary | ICD-10-CM | POA: Diagnosis not present

## 2020-05-09 DIAGNOSIS — R739 Hyperglycemia, unspecified: Secondary | ICD-10-CM | POA: Diagnosis not present

## 2020-05-09 DIAGNOSIS — E039 Hypothyroidism, unspecified: Secondary | ICD-10-CM | POA: Diagnosis not present

## 2020-05-16 DIAGNOSIS — F1320 Sedative, hypnotic or anxiolytic dependence, uncomplicated: Secondary | ICD-10-CM | POA: Diagnosis not present

## 2020-05-16 DIAGNOSIS — M1990 Unspecified osteoarthritis, unspecified site: Secondary | ICD-10-CM | POA: Diagnosis not present

## 2020-05-16 DIAGNOSIS — I10 Essential (primary) hypertension: Secondary | ICD-10-CM | POA: Diagnosis not present

## 2020-05-16 DIAGNOSIS — Z0000 Encounter for general adult medical examination without abnormal findings: Secondary | ICD-10-CM | POA: Diagnosis not present

## 2020-05-16 DIAGNOSIS — M8580 Other specified disorders of bone density and structure, unspecified site: Secondary | ICD-10-CM | POA: Diagnosis not present

## 2020-05-16 DIAGNOSIS — F329 Major depressive disorder, single episode, unspecified: Secondary | ICD-10-CM | POA: Diagnosis not present

## 2020-05-16 DIAGNOSIS — E785 Hyperlipidemia, unspecified: Secondary | ICD-10-CM | POA: Diagnosis not present

## 2020-05-16 DIAGNOSIS — E039 Hypothyroidism, unspecified: Secondary | ICD-10-CM | POA: Diagnosis not present

## 2020-05-16 DIAGNOSIS — F1020 Alcohol dependence, uncomplicated: Secondary | ICD-10-CM | POA: Diagnosis not present

## 2020-05-25 DIAGNOSIS — M5416 Radiculopathy, lumbar region: Secondary | ICD-10-CM | POA: Diagnosis not present

## 2020-06-08 DIAGNOSIS — M5416 Radiculopathy, lumbar region: Secondary | ICD-10-CM | POA: Diagnosis not present

## 2020-06-17 DIAGNOSIS — M5416 Radiculopathy, lumbar region: Secondary | ICD-10-CM | POA: Diagnosis not present

## 2020-06-22 DIAGNOSIS — M5416 Radiculopathy, lumbar region: Secondary | ICD-10-CM | POA: Diagnosis not present

## 2020-06-24 DIAGNOSIS — M5416 Radiculopathy, lumbar region: Secondary | ICD-10-CM | POA: Diagnosis not present

## 2020-06-28 DIAGNOSIS — M5416 Radiculopathy, lumbar region: Secondary | ICD-10-CM | POA: Diagnosis not present

## 2020-07-07 ENCOUNTER — Encounter: Payer: Self-pay | Admitting: Internal Medicine

## 2020-08-18 DIAGNOSIS — Z01419 Encounter for gynecological examination (general) (routine) without abnormal findings: Secondary | ICD-10-CM | POA: Diagnosis not present

## 2020-08-18 DIAGNOSIS — Z6833 Body mass index (BMI) 33.0-33.9, adult: Secondary | ICD-10-CM | POA: Diagnosis not present

## 2020-08-18 DIAGNOSIS — Z124 Encounter for screening for malignant neoplasm of cervix: Secondary | ICD-10-CM | POA: Diagnosis not present

## 2020-08-22 ENCOUNTER — Other Ambulatory Visit: Payer: Self-pay | Admitting: Obstetrics & Gynecology

## 2020-08-22 DIAGNOSIS — N6320 Unspecified lump in the left breast, unspecified quadrant: Secondary | ICD-10-CM

## 2020-08-22 DIAGNOSIS — N644 Mastodynia: Secondary | ICD-10-CM

## 2020-08-23 ENCOUNTER — Other Ambulatory Visit: Payer: Self-pay | Admitting: Obstetrics & Gynecology

## 2020-08-23 DIAGNOSIS — N644 Mastodynia: Secondary | ICD-10-CM

## 2020-08-23 DIAGNOSIS — N6320 Unspecified lump in the left breast, unspecified quadrant: Secondary | ICD-10-CM

## 2020-10-07 ENCOUNTER — Other Ambulatory Visit: Payer: Self-pay

## 2020-10-07 ENCOUNTER — Ambulatory Visit
Admission: RE | Admit: 2020-10-07 | Discharge: 2020-10-07 | Disposition: A | Discharge status: Home / Self Care | Payer: Medicare HMO | Source: Ambulatory Visit | Attending: Obstetrics & Gynecology | Admitting: Obstetrics & Gynecology

## 2020-10-07 ENCOUNTER — Ambulatory Visit: Payer: Medicare HMO

## 2020-10-07 DIAGNOSIS — N644 Mastodynia: Secondary | ICD-10-CM

## 2020-10-07 DIAGNOSIS — R922 Inconclusive mammogram: Secondary | ICD-10-CM | POA: Diagnosis not present

## 2020-10-07 DIAGNOSIS — N6320 Unspecified lump in the left breast, unspecified quadrant: Secondary | ICD-10-CM

## 2020-11-07 DIAGNOSIS — E039 Hypothyroidism, unspecified: Secondary | ICD-10-CM | POA: Diagnosis not present

## 2020-11-07 DIAGNOSIS — J069 Acute upper respiratory infection, unspecified: Secondary | ICD-10-CM | POA: Diagnosis not present

## 2020-11-07 DIAGNOSIS — F1320 Sedative, hypnotic or anxiolytic dependence, uncomplicated: Secondary | ICD-10-CM | POA: Diagnosis not present

## 2020-11-07 DIAGNOSIS — J309 Allergic rhinitis, unspecified: Secondary | ICD-10-CM | POA: Diagnosis not present

## 2020-11-07 DIAGNOSIS — R739 Hyperglycemia, unspecified: Secondary | ICD-10-CM | POA: Diagnosis not present

## 2020-11-07 DIAGNOSIS — M8580 Other specified disorders of bone density and structure, unspecified site: Secondary | ICD-10-CM | POA: Diagnosis not present

## 2020-11-07 DIAGNOSIS — I10 Essential (primary) hypertension: Secondary | ICD-10-CM | POA: Diagnosis not present

## 2020-11-07 DIAGNOSIS — E785 Hyperlipidemia, unspecified: Secondary | ICD-10-CM | POA: Diagnosis not present

## 2020-11-07 DIAGNOSIS — M545 Low back pain, unspecified: Secondary | ICD-10-CM | POA: Diagnosis not present

## 2021-03-31 DIAGNOSIS — M5416 Radiculopathy, lumbar region: Secondary | ICD-10-CM | POA: Diagnosis not present

## 2021-05-10 DIAGNOSIS — M5416 Radiculopathy, lumbar region: Secondary | ICD-10-CM | POA: Diagnosis not present

## 2021-05-12 DIAGNOSIS — M5416 Radiculopathy, lumbar region: Secondary | ICD-10-CM | POA: Diagnosis not present

## 2021-05-16 DIAGNOSIS — E785 Hyperlipidemia, unspecified: Secondary | ICD-10-CM | POA: Diagnosis not present

## 2021-05-16 DIAGNOSIS — E039 Hypothyroidism, unspecified: Secondary | ICD-10-CM | POA: Diagnosis not present

## 2021-05-16 DIAGNOSIS — I10 Essential (primary) hypertension: Secondary | ICD-10-CM | POA: Diagnosis not present

## 2021-05-18 IMAGING — MG DIGITAL SCREENING BILAT W/ TOMO W/ CAD
8 series · 8 of 24 positions shown · non-contrast
Comparison: Previous exam(s).

CLINICAL DATA: Screening.

EXAM:
DIGITAL SCREENING BILATERAL MAMMOGRAM WITH TOMO AND CAD

[R CC synth-2D]
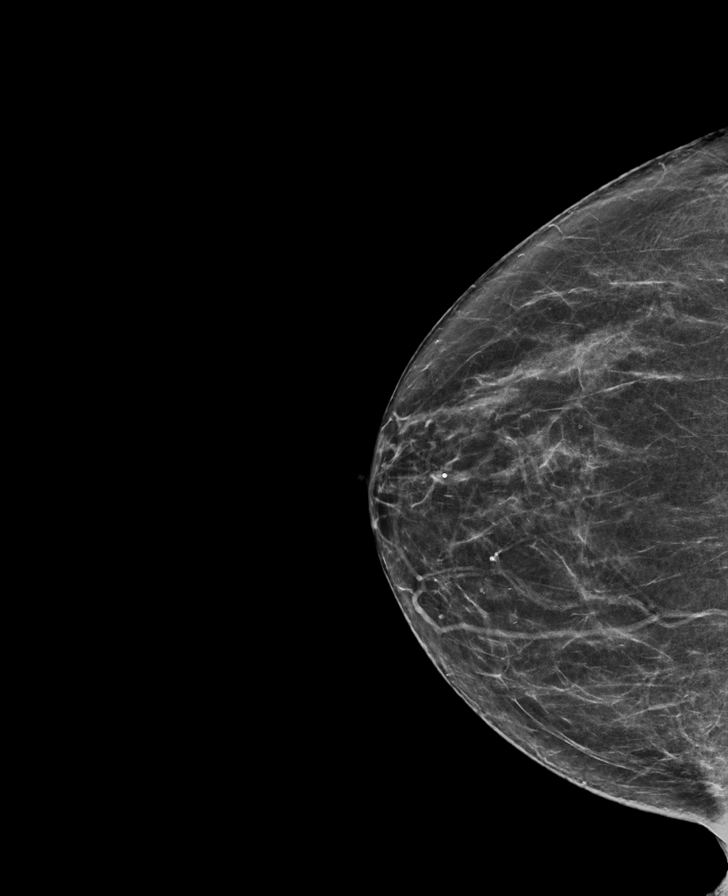

[R MLO synth-2D]
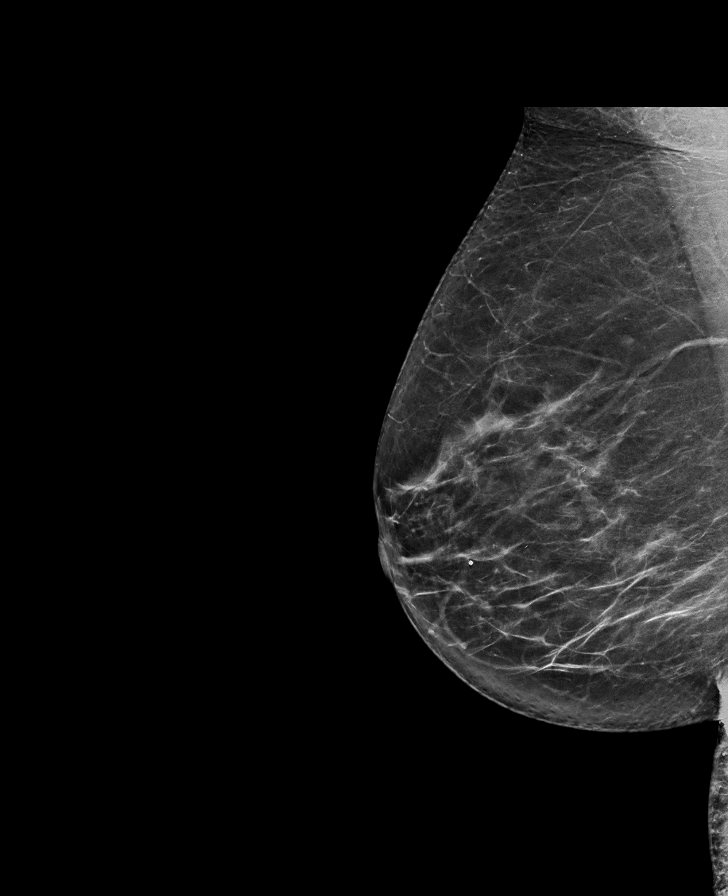

[L MLO synth-2D]
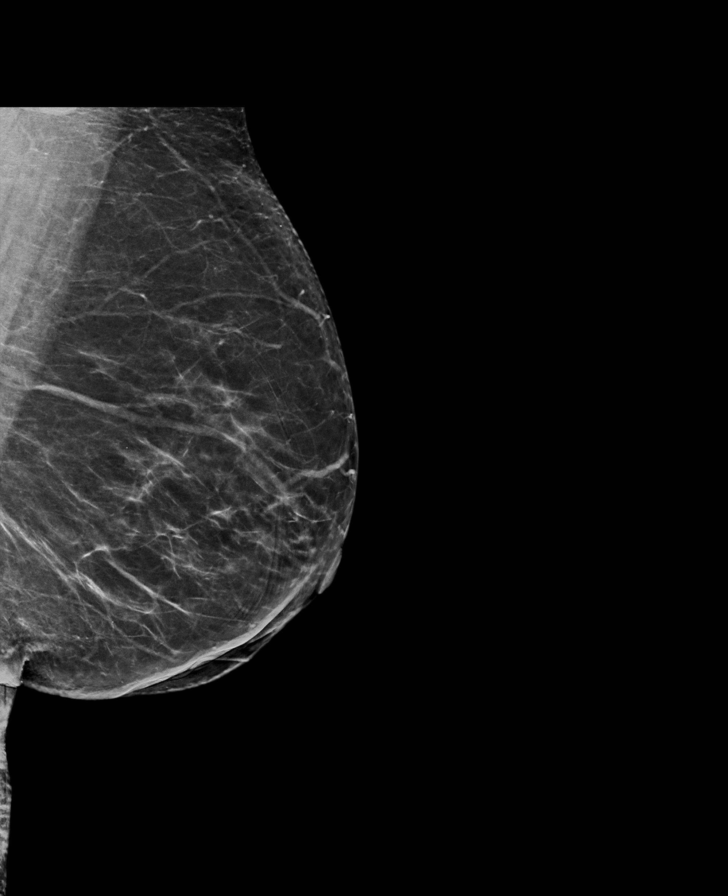

[L CC synth-2D]
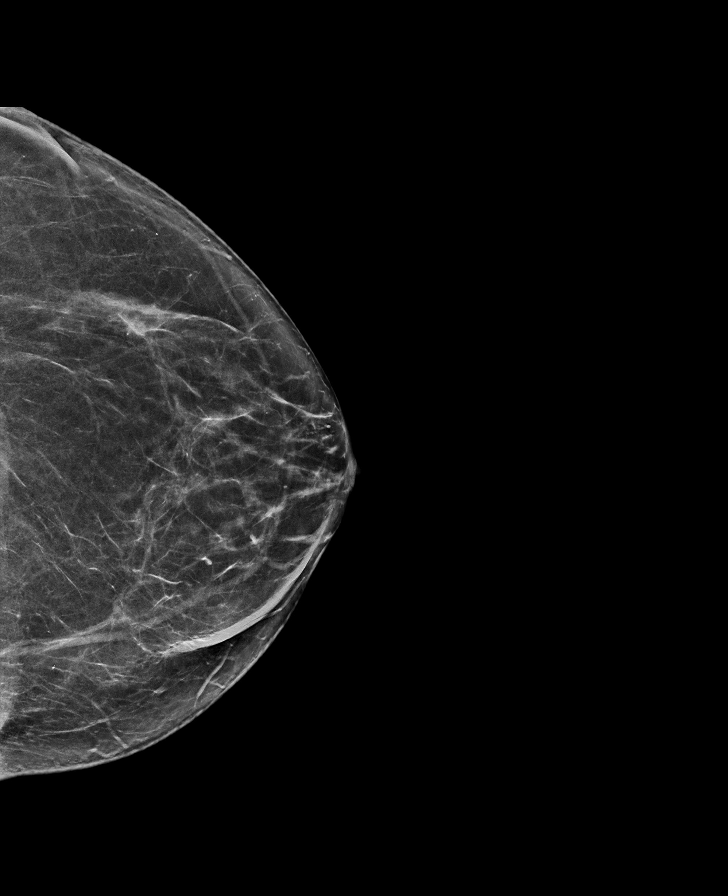

[R MLO tomo · tomo slice 39/77.0]
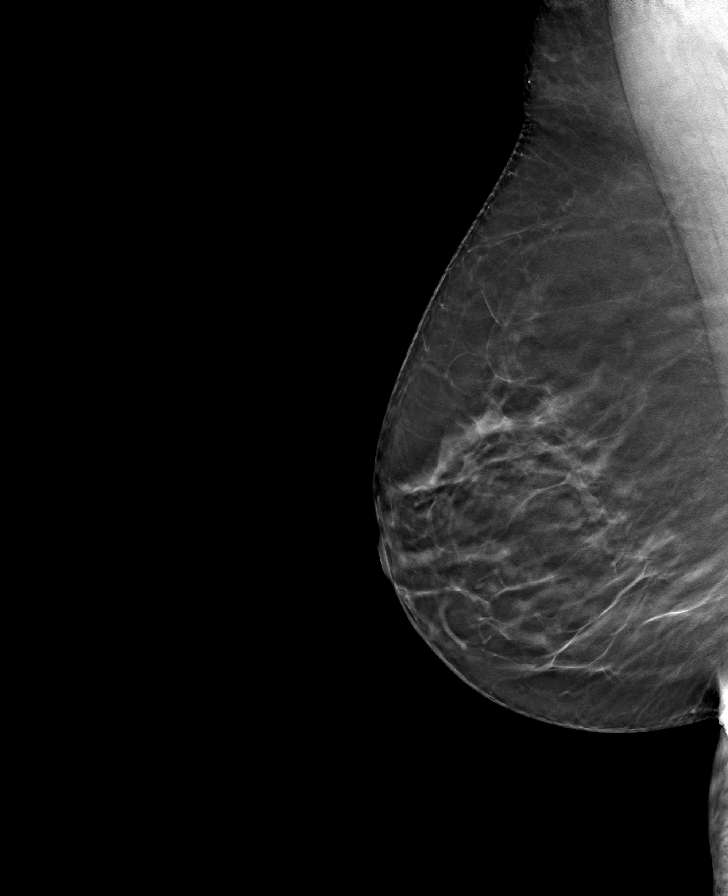

[L CC tomo · tomo slice 36/71.0]
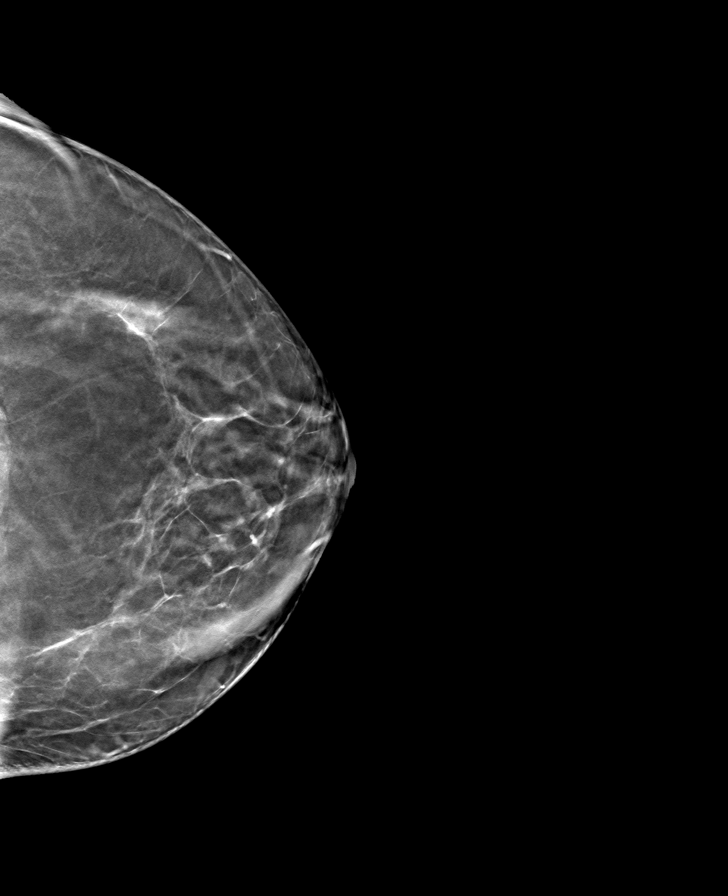

[L MLO tomo · tomo slice 39/78.0]
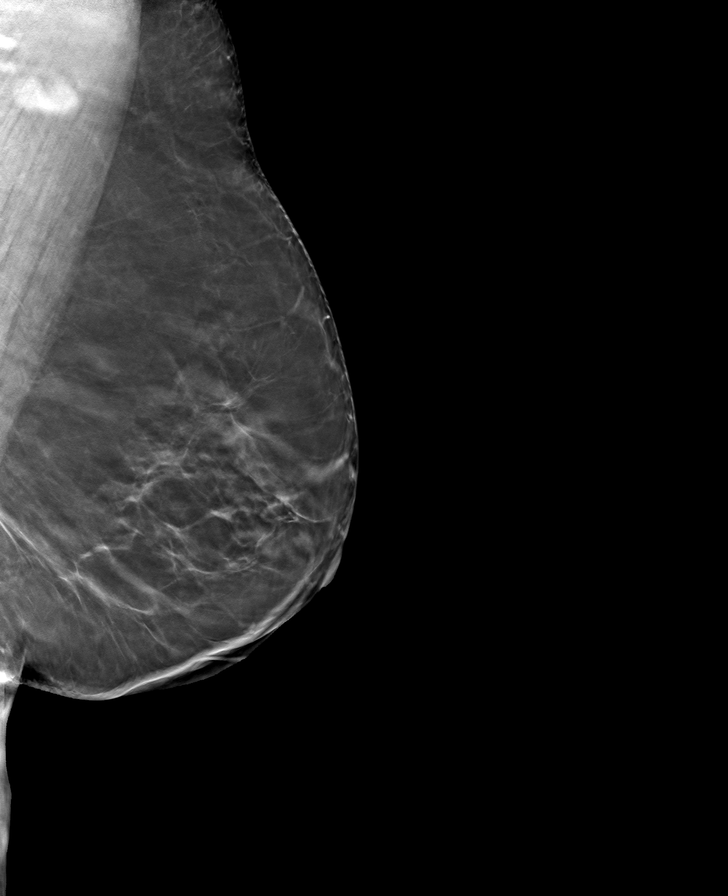

[R CC tomo · tomo slice 34/67.0]
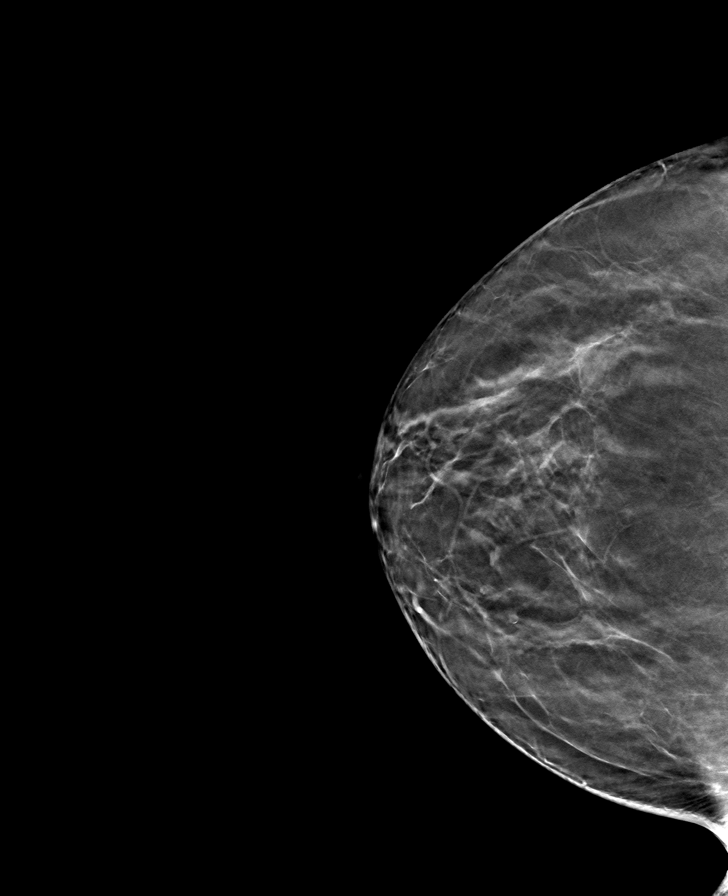

[8 of 24 positions shown; findings below may reference images not displayed]

ACR Breast Density Category b: There are scattered areas of
fibroglandular density.
FINDINGS: There are no findings suspicious for malignancy. Images were
processed with CAD.
IMPRESSION: No mammographic evidence of malignancy. A result letter of this
screening mammogram will be mailed directly to the patient.

RECOMMENDATION:
Screening mammogram in one year. (Code:CN-U-775)

BI-RADS CATEGORY  1: Negative.

## 2021-05-19 DIAGNOSIS — I10 Essential (primary) hypertension: Secondary | ICD-10-CM | POA: Diagnosis not present

## 2021-05-19 DIAGNOSIS — Z1331 Encounter for screening for depression: Secondary | ICD-10-CM | POA: Diagnosis not present

## 2021-05-19 DIAGNOSIS — M5459 Other low back pain: Secondary | ICD-10-CM | POA: Diagnosis not present

## 2021-05-19 DIAGNOSIS — E785 Hyperlipidemia, unspecified: Secondary | ICD-10-CM | POA: Diagnosis not present

## 2021-05-19 DIAGNOSIS — M8580 Other specified disorders of bone density and structure, unspecified site: Secondary | ICD-10-CM | POA: Diagnosis not present

## 2021-05-19 DIAGNOSIS — R82998 Other abnormal findings in urine: Secondary | ICD-10-CM | POA: Diagnosis not present

## 2021-05-19 DIAGNOSIS — Z1339 Encounter for screening examination for other mental health and behavioral disorders: Secondary | ICD-10-CM | POA: Diagnosis not present

## 2021-05-19 DIAGNOSIS — F329 Major depressive disorder, single episode, unspecified: Secondary | ICD-10-CM | POA: Diagnosis not present

## 2021-05-19 DIAGNOSIS — F1020 Alcohol dependence, uncomplicated: Secondary | ICD-10-CM | POA: Diagnosis not present

## 2021-05-19 DIAGNOSIS — E039 Hypothyroidism, unspecified: Secondary | ICD-10-CM | POA: Diagnosis not present

## 2021-05-19 DIAGNOSIS — F1320 Sedative, hypnotic or anxiolytic dependence, uncomplicated: Secondary | ICD-10-CM | POA: Diagnosis not present

## 2021-05-19 DIAGNOSIS — Z0000 Encounter for general adult medical examination without abnormal findings: Secondary | ICD-10-CM | POA: Diagnosis not present

## 2021-05-25 DIAGNOSIS — S8261XA Displaced fracture of lateral malleolus of right fibula, initial encounter for closed fracture: Secondary | ICD-10-CM | POA: Diagnosis not present

## 2021-05-25 DIAGNOSIS — M7989 Other specified soft tissue disorders: Secondary | ICD-10-CM | POA: Diagnosis not present

## 2021-05-25 DIAGNOSIS — S99911A Unspecified injury of right ankle, initial encounter: Secondary | ICD-10-CM | POA: Diagnosis not present

## 2021-06-05 DIAGNOSIS — S82831A Other fracture of upper and lower end of right fibula, initial encounter for closed fracture: Secondary | ICD-10-CM | POA: Diagnosis not present

## 2021-06-15 DIAGNOSIS — M5416 Radiculopathy, lumbar region: Secondary | ICD-10-CM | POA: Diagnosis not present

## 2021-06-30 DIAGNOSIS — M5416 Radiculopathy, lumbar region: Secondary | ICD-10-CM | POA: Diagnosis not present

## 2021-07-10 DIAGNOSIS — S8291XA Unspecified fracture of right lower leg, initial encounter for closed fracture: Secondary | ICD-10-CM | POA: Diagnosis not present

## 2021-08-07 ENCOUNTER — Encounter: Payer: Self-pay | Admitting: Internal Medicine

## 2021-08-08 DIAGNOSIS — M5136 Other intervertebral disc degeneration, lumbar region: Secondary | ICD-10-CM | POA: Diagnosis not present

## 2021-08-08 DIAGNOSIS — S82891A Other fracture of right lower leg, initial encounter for closed fracture: Secondary | ICD-10-CM | POA: Diagnosis not present

## 2021-08-08 DIAGNOSIS — R413 Other amnesia: Secondary | ICD-10-CM | POA: Diagnosis not present

## 2021-08-08 DIAGNOSIS — F1020 Alcohol dependence, uncomplicated: Secondary | ICD-10-CM | POA: Diagnosis not present

## 2021-08-14 DIAGNOSIS — Z780 Asymptomatic menopausal state: Secondary | ICD-10-CM | POA: Diagnosis not present

## 2021-08-17 DIAGNOSIS — M791 Myalgia, unspecified site: Secondary | ICD-10-CM | POA: Diagnosis not present

## 2021-08-17 DIAGNOSIS — M47896 Other spondylosis, lumbar region: Secondary | ICD-10-CM | POA: Diagnosis not present

## 2021-08-17 DIAGNOSIS — M5136 Other intervertebral disc degeneration, lumbar region: Secondary | ICD-10-CM | POA: Diagnosis not present

## 2021-08-17 DIAGNOSIS — M4316 Spondylolisthesis, lumbar region: Secondary | ICD-10-CM | POA: Diagnosis not present

## 2021-08-17 DIAGNOSIS — M5416 Radiculopathy, lumbar region: Secondary | ICD-10-CM | POA: Diagnosis not present

## 2021-08-17 DIAGNOSIS — M545 Low back pain, unspecified: Secondary | ICD-10-CM | POA: Diagnosis not present

## 2021-08-21 DIAGNOSIS — M25571 Pain in right ankle and joints of right foot: Secondary | ICD-10-CM | POA: Diagnosis not present

## 2021-08-23 ENCOUNTER — Ambulatory Visit (AMBULATORY_SURGERY_CENTER): Payer: Self-pay

## 2021-08-23 VITALS — Ht 60.25 in | Wt 160.0 lb

## 2021-08-23 DIAGNOSIS — Z86010 Personal history of colonic polyps: Secondary | ICD-10-CM

## 2021-08-23 MED ORDER — NA SULFATE-K SULFATE-MG SULF 17.5-3.13-1.6 GM/177ML PO SOLN: 1.0000 | ORAL | 0 refills | Status: DC

## 2021-08-23 NOTE — Progress Notes (Signed)
No egg or soy allergy known to patient  No issues known to pt with past sedation with any surgeries or procedures Patient denies ever being told they had issues or difficulty with intubation  No FH of Malignant Hyperthermia Pt is not on diet pills Pt is not on  home 02  Pt is not on blood thinners  Pt denies issues with constipation  No A fib or A flutter Have any cardiac testing pending--denied Pt instructed to use Single[employer][email or web address] or GoodRx for a price reduction on prep   

## 2021-09-13 ENCOUNTER — Encounter: Payer: Medicare HMO | Admitting: Internal Medicine

## 2021-09-13 ENCOUNTER — Encounter: Payer: Self-pay | Admitting: Internal Medicine

## 2021-09-14 DIAGNOSIS — M791 Myalgia, unspecified site: Secondary | ICD-10-CM | POA: Diagnosis not present

## 2021-09-14 DIAGNOSIS — M47896 Other spondylosis, lumbar region: Secondary | ICD-10-CM | POA: Diagnosis not present

## 2021-09-14 DIAGNOSIS — M5416 Radiculopathy, lumbar region: Secondary | ICD-10-CM | POA: Diagnosis not present

## 2021-09-14 DIAGNOSIS — M545 Low back pain, unspecified: Secondary | ICD-10-CM | POA: Diagnosis not present

## 2021-09-14 DIAGNOSIS — M5136 Other intervertebral disc degeneration, lumbar region: Secondary | ICD-10-CM | POA: Diagnosis not present

## 2021-09-14 DIAGNOSIS — M4316 Spondylolisthesis, lumbar region: Secondary | ICD-10-CM | POA: Diagnosis not present

## 2021-09-28 ENCOUNTER — Encounter: Payer: Self-pay | Admitting: Internal Medicine

## 2021-09-28 ENCOUNTER — Ambulatory Visit (AMBULATORY_SURGERY_CENTER): Payer: Medicare HMO | Admitting: Internal Medicine

## 2021-09-28 VITALS — BP 172/73 | HR 67 | Temp 97.1°F | Resp 10 | Ht 60.0 in | Wt 160.0 lb

## 2021-09-28 DIAGNOSIS — I10 Essential (primary) hypertension: Secondary | ICD-10-CM | POA: Diagnosis not present

## 2021-09-28 DIAGNOSIS — F32A Depression, unspecified: Secondary | ICD-10-CM | POA: Diagnosis not present

## 2021-09-28 DIAGNOSIS — Z09 Encounter for follow-up examination after completed treatment for conditions other than malignant neoplasm: Secondary | ICD-10-CM

## 2021-09-28 DIAGNOSIS — E039 Hypothyroidism, unspecified: Secondary | ICD-10-CM | POA: Diagnosis not present

## 2021-09-28 DIAGNOSIS — Z86010 Personal history of colonic polyps: Secondary | ICD-10-CM | POA: Diagnosis not present

## 2021-09-28 MED ORDER — SODIUM CHLORIDE 0.9 % IV SOLN: 500.0000 mL | INTRAVENOUS | Status: DC

## 2021-09-28 NOTE — Progress Notes (Signed)
HISTORY OF PRESENT ILLNESS:  Melissa Wolfe is a 71 y.o. female with a history of adenomatous colon polyps.  She presents today for surveillance colonoscopy.  No complaints  REVIEW OF SYSTEMS:  All non-GI ROS negative. Past Medical History:  Diagnosis Date   Alcoholism ([place])    Allergy    Depression    Hyperlipemia    Hypertension    Hypothyroid     Past Surgical History:  Procedure Laterality Date   BREAST EXCISIONAL BIOPSY Left 2008   benign   COLONOSCOPY     fallopian ablation     HAND SURGERY Left 05/2015   MYOMECTOMY     x2   OOPHORECTOMY Right    POLYPECTOMY      Social History Melissa Wolfe  reports that she has been smoking cigarettes. She has been smoking an average of .1 packs per day. She has never used smokeless tobacco. She reports that she does not drink alcohol and does not use drugs.  family history is not on file.  Allergies  Allergen Reactions   Codeine Other (See Comments)    Hallucinations and jittery   Cephalexin Rash       PHYSICAL EXAMINATION: Vital signs: BP (!) 165/84   Pulse 69   Temp (!) 97.1 F (36.2 C)   Ht 5' (1.524 m)   Wt 160 lb (72.6 kg)   SpO2 99%   BMI 31.25 kg/m  General: Well-developed, well-nourished, no acute distress HEENT: Sclerae are anicteric, conjunctiva pink. Oral mucosa intact Lungs: Clear Heart: Regular Abdomen: soft, nontender, nondistended, no obvious ascites, no peritoneal signs, normal bowel sounds. No organomegaly. Extremities: No edema Psychiatric: alert and oriented x3. Cooperative      ASSESSMENT:  History of adenomatous colon polyps   PLAN:   Surveillance colonoscopy

## 2021-09-28 NOTE — Progress Notes (Signed)
Vss nad trans to pacu °

## 2021-09-28 NOTE — Op Note (Signed)
[facility] Patient Name: Melissa Wolfe Procedure Date: 09/28/2021 2:05 PM MRN: [id number] Endoscopist: Docia Chuck. Henrene Pastor , MD Age: 71 Referring MD:  Date of Birth: 1950/04/23 Gender: Female Account #: [id number] Procedure:                Colonoscopy Indications:              High risk colon cancer surveillance: Personal                            history of non-advanced adenomas. Previous                            examinations 2011, 2017 Medicines:                Monitored Anesthesia Care Procedure:                Pre-Anesthesia Assessment:                           - Prior to the procedure, a History and Physical                            was performed, and patient medications and                            allergies were reviewed. The patient's tolerance of                            previous anesthesia was also reviewed. The risks                            and benefits of the procedure and the sedation                            options and risks were discussed with the patient.                            All questions were answered, and informed consent                            was obtained. Prior Anticoagulants: The patient has                            taken no previous anticoagulant or antiplatelet                            agents. ASA Grade Assessment: II - A patient with                            mild systemic disease. After reviewing the risks                            and benefits, the patient was deemed in  satisfactory condition to undergo the procedure.                           After obtaining informed consent, the colonoscope                            was passed under direct vision. Throughout the                            procedure, the patient's blood pressure, pulse, and                            oxygen saturations were monitored continuously. The                            CF [id number] #[id number] was introduced through the  anus                            and advanced to the the cecum, identified by                            appendiceal orifice and ileocecal valve. The                            ileocecal valve, appendiceal orifice, and rectum                            were photographed. The quality of the bowel                            preparation was excellent. The colonoscopy was                            performed without difficulty. The patient tolerated                            the procedure well. The bowel preparation used was                            SUPREP via split dose instruction. Scope In: 2:14:44 PM Scope Out: 2:25:44 PM Scope Withdrawal Time: 0 hours 8 minutes 2 seconds  Total Procedure Duration: 0 hours 11 minutes 0 seconds  Findings:                 The entire examined colon appeared normal on direct                            and retroflexion views. Internal hemorrhoids noted. Complications:            No immediate complications. Estimated blood loss:                            None. Estimated Blood Loss:     Estimated blood loss: none. Impression:               -  The entire examined colon is normal on direct and                            retroflexion views.                           - No specimens collected. Recommendation:           - Repeat colonoscopy is not recommended for                            surveillance.                           - Patient has a contact number available for                            emergencies. The signs and symptoms of potential                            delayed complications were discussed with the                            patient. Return to normal activities tomorrow.                            Written discharge instructions were provided to the                            patient.                           - Resume previous diet.                           - Continue present medications. Docia Chuck. Henrene Pastor, MD 09/28/2021 2:30:20 PM This report  has been signed electronically.

## 2021-09-28 NOTE — Patient Instructions (Signed)
Resume previous diet and continue present medications. No repeat colonoscopy for surveillance due to age unless new symptoms arise.  YOU HAD AN ENDOSCOPIC PROCEDURE TODAY AT [facility]:   Refer to the procedure report that was given to you for any specific questions about what was found during the examination.  If the procedure report does not answer your questions, please call your gastroenterologist to clarify.  If you requested that your care partner not be given the details of your procedure findings, then the procedure report has been included in a sealed envelope for you to review at your convenience later.  YOU SHOULD EXPECT: Some feelings of bloating in the abdomen. Passage of more gas than usual.  Walking can help get rid of the air that was put into your GI tract during the procedure and reduce the bloating. If you had a lower endoscopy (such as a colonoscopy or flexible sigmoidoscopy) you may notice spotting of blood in your stool or on the toilet paper. If you underwent a bowel prep for your procedure, you may not have a normal bowel movement for a few days.  Please Note:  You might notice some irritation and congestion in your nose or some drainage.  This is from the oxygen used during your procedure.  There is no need for concern and it should clear up in a day or so.  SYMPTOMS TO REPORT IMMEDIATELY:  Following lower endoscopy (colonoscopy or flexible sigmoidoscopy):  Excessive amounts of blood in the stool  Significant tenderness or worsening of abdominal pains  Swelling of the abdomen that is new, acute  Fever of 100F or higher  For urgent or emergent issues, a gastroenterologist can be reached at any hour by calling [phone]. Do not use MyChart messaging for urgent concerns.    DIET:  We do recommend a small meal at first, but then you may proceed to your regular diet.  Drink plenty of fluids but you should avoid alcoholic beverages for 24  hours.  ACTIVITY:  You should plan to take it easy for the rest of today and you should NOT DRIVE or use heavy machinery until tomorrow (because of the sedation medicines used during the test).    FOLLOW UP: Our staff will call the number listed on your records the next business day following your procedure.  We will call around 7:15- 8:00 am to check on you and address any questions or concerns that you may have regarding the information given to you following your procedure. If we do not reach you, we will leave a message.     If any biopsies were taken you will be contacted by phone or by letter within the next 1-3 weeks.  Please call us at [phone] if you have not heard about the biopsies in 3 weeks.    SIGNATURES/CONFIDENTIALITY: You and/or your care partner have signed paperwork which will be entered into your electronic medical record.  These signatures attest to the fact that that the information above on your After Visit Summary has been reviewed and is understood.  Full responsibility of the confidentiality of this discharge information lies with you and/or your care-partner.

## 2021-09-29 ENCOUNTER — Telehealth: Payer: Self-pay | Admitting: *Deleted

## 2021-09-29 NOTE — Telephone Encounter (Signed)
  Follow up Call-    Row Labels 09/28/2021    1:11 PM  Call back number   Section Header. No data exists in this row.   Post procedure Call Back phone  #   [phone]  Permission to leave phone message   Yes     Patient questions:  Do you have a fever, pain , or abdominal swelling? No. Pain Score  0 *  Have you tolerated food without any problems? Yes.    Have you been able to return to your normal activities? Yes.    Do you have any questions about your discharge instructions: Diet   No. Medications  No. Follow up visit  No.  Do you have questions or concerns about your Care? No.  Actions: * If pain score is 4 or above: No action needed, pain <4.

## 2021-10-04 DIAGNOSIS — M5416 Radiculopathy, lumbar region: Secondary | ICD-10-CM | POA: Diagnosis not present

## 2021-11-20 ENCOUNTER — Other Ambulatory Visit: Payer: Self-pay | Admitting: Internal Medicine

## 2021-11-20 DIAGNOSIS — Z885 Allergy status to narcotic agent status: Secondary | ICD-10-CM | POA: Diagnosis not present

## 2021-11-20 DIAGNOSIS — Z1231 Encounter for screening mammogram for malignant neoplasm of breast: Secondary | ICD-10-CM

## 2021-11-20 DIAGNOSIS — G44319 Acute post-traumatic headache, not intractable: Secondary | ICD-10-CM | POA: Diagnosis not present

## 2021-11-20 DIAGNOSIS — Z043 Encounter for examination and observation following other accident: Secondary | ICD-10-CM | POA: Diagnosis not present

## 2021-11-20 DIAGNOSIS — W19XXXA Unspecified fall, initial encounter: Secondary | ICD-10-CM | POA: Diagnosis not present

## 2021-11-20 DIAGNOSIS — S0990XA Unspecified injury of head, initial encounter: Secondary | ICD-10-CM | POA: Diagnosis not present

## 2021-11-20 DIAGNOSIS — R519 Headache, unspecified: Secondary | ICD-10-CM | POA: Diagnosis not present

## 2021-11-20 DIAGNOSIS — S0083XA Contusion of other part of head, initial encounter: Secondary | ICD-10-CM | POA: Diagnosis not present

## 2021-11-20 DIAGNOSIS — Z881 Allergy status to other antibiotic agents status: Secondary | ICD-10-CM | POA: Diagnosis not present

## 2021-11-20 DIAGNOSIS — F1010 Alcohol abuse, uncomplicated: Secondary | ICD-10-CM | POA: Diagnosis not present

## 2021-12-01 DIAGNOSIS — R296 Repeated falls: Secondary | ICD-10-CM | POA: Diagnosis not present

## 2021-12-01 DIAGNOSIS — R739 Hyperglycemia, unspecified: Secondary | ICD-10-CM | POA: Diagnosis not present

## 2021-12-01 DIAGNOSIS — M1990 Unspecified osteoarthritis, unspecified site: Secondary | ICD-10-CM | POA: Diagnosis not present

## 2021-12-01 DIAGNOSIS — Z23 Encounter for immunization: Secondary | ICD-10-CM | POA: Diagnosis not present

## 2021-12-01 DIAGNOSIS — E785 Hyperlipidemia, unspecified: Secondary | ICD-10-CM | POA: Diagnosis not present

## 2021-12-01 DIAGNOSIS — M8580 Other specified disorders of bone density and structure, unspecified site: Secondary | ICD-10-CM | POA: Diagnosis not present

## 2021-12-01 DIAGNOSIS — M5459 Other low back pain: Secondary | ICD-10-CM | POA: Diagnosis not present

## 2021-12-01 DIAGNOSIS — E039 Hypothyroidism, unspecified: Secondary | ICD-10-CM | POA: Diagnosis not present

## 2021-12-01 DIAGNOSIS — I10 Essential (primary) hypertension: Secondary | ICD-10-CM | POA: Diagnosis not present

## 2021-12-01 DIAGNOSIS — F329 Major depressive disorder, single episode, unspecified: Secondary | ICD-10-CM | POA: Diagnosis not present

## 2021-12-14 DIAGNOSIS — M5416 Radiculopathy, lumbar region: Secondary | ICD-10-CM | POA: Diagnosis not present

## 2021-12-14 DIAGNOSIS — M791 Myalgia, unspecified site: Secondary | ICD-10-CM | POA: Diagnosis not present

## 2021-12-14 DIAGNOSIS — M5136 Other intervertebral disc degeneration, lumbar region: Secondary | ICD-10-CM | POA: Diagnosis not present

## 2021-12-14 DIAGNOSIS — M545 Low back pain, unspecified: Secondary | ICD-10-CM | POA: Diagnosis not present

## 2021-12-14 DIAGNOSIS — M4316 Spondylolisthesis, lumbar region: Secondary | ICD-10-CM | POA: Diagnosis not present

## 2021-12-14 DIAGNOSIS — M47896 Other spondylosis, lumbar region: Secondary | ICD-10-CM | POA: Diagnosis not present

## 2021-12-14 DIAGNOSIS — M48062 Spinal stenosis, lumbar region with neurogenic claudication: Secondary | ICD-10-CM | POA: Diagnosis not present

## 2022-01-08 DIAGNOSIS — R030 Elevated blood-pressure reading, without diagnosis of hypertension: Secondary | ICD-10-CM | POA: Diagnosis not present

## 2022-01-08 DIAGNOSIS — R519 Headache, unspecified: Secondary | ICD-10-CM | POA: Diagnosis not present

## 2022-01-08 DIAGNOSIS — Z20822 Contact with and (suspected) exposure to covid-19: Secondary | ICD-10-CM | POA: Diagnosis not present

## 2022-01-08 DIAGNOSIS — R6883 Chills (without fever): Secondary | ICD-10-CM | POA: Diagnosis not present

## 2022-01-17 DIAGNOSIS — M5416 Radiculopathy, lumbar region: Secondary | ICD-10-CM | POA: Diagnosis not present

## 2022-04-26 DIAGNOSIS — M5416 Radiculopathy, lumbar region: Secondary | ICD-10-CM | POA: Diagnosis not present

## 2022-04-26 DIAGNOSIS — M545 Low back pain, unspecified: Secondary | ICD-10-CM | POA: Diagnosis not present

## 2022-04-26 DIAGNOSIS — M47896 Other spondylosis, lumbar region: Secondary | ICD-10-CM | POA: Diagnosis not present

## 2022-04-26 DIAGNOSIS — M48062 Spinal stenosis, lumbar region with neurogenic claudication: Secondary | ICD-10-CM | POA: Diagnosis not present

## 2022-04-26 DIAGNOSIS — M5136 Other intervertebral disc degeneration, lumbar region: Secondary | ICD-10-CM | POA: Diagnosis not present

## 2022-04-26 DIAGNOSIS — M4316 Spondylolisthesis, lumbar region: Secondary | ICD-10-CM | POA: Diagnosis not present

## 2022-04-26 DIAGNOSIS — M791 Myalgia, unspecified site: Secondary | ICD-10-CM | POA: Diagnosis not present

## 2022-05-03 ENCOUNTER — Ambulatory Visit: Payer: Medicare HMO

## 2022-05-09 DIAGNOSIS — M5416 Radiculopathy, lumbar region: Secondary | ICD-10-CM | POA: Diagnosis not present

## 2022-05-10 ENCOUNTER — Emergency Department (HOSPITAL_COMMUNITY): Payer: Medicare HMO

## 2022-05-10 ENCOUNTER — Emergency Department (HOSPITAL_COMMUNITY)
Admission: EM | Admit: 2022-05-10 | Discharge: 2022-05-10 | Disposition: A | Discharge status: Home / Self Care | Payer: Medicare HMO | Attending: Emergency Medicine | Admitting: Emergency Medicine

## 2022-05-10 DIAGNOSIS — M47812 Spondylosis without myelopathy or radiculopathy, cervical region: Secondary | ICD-10-CM | POA: Diagnosis not present

## 2022-05-10 DIAGNOSIS — S0093XA Contusion of unspecified part of head, initial encounter: Secondary | ICD-10-CM | POA: Insufficient documentation

## 2022-05-10 DIAGNOSIS — Y92481 Parking lot as the place of occurrence of the external cause: Secondary | ICD-10-CM | POA: Insufficient documentation

## 2022-05-10 DIAGNOSIS — S0990XA Unspecified injury of head, initial encounter: Secondary | ICD-10-CM | POA: Diagnosis not present

## 2022-05-10 DIAGNOSIS — S161XXA Strain of muscle, fascia and tendon at neck level, initial encounter: Secondary | ICD-10-CM | POA: Diagnosis not present

## 2022-05-10 DIAGNOSIS — R519 Headache, unspecified: Secondary | ICD-10-CM | POA: Diagnosis present
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
## 2022-05-10 NOTE — Discharge Instructions (Addendum)
Follow up with your Physician for recheck in 1 week. Return if any problems.   
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
## 2022-05-18 ENCOUNTER — Telehealth: Payer: Self-pay

## 2022-05-18 NOTE — Telephone Encounter (Signed)
Transition Care Management Unsuccessful Follow-up Telephone Call  Date of discharge and from where:  05/10/2022 [facility]  Attempts:  1st Attempt  Reason for unsuccessful TCM follow-up call:  No answer/busy  Leah Thornberry [place]  [facility] Community Resource Care Guide   [email or web address]  ?? [phone]   Website: [email or web address]  [email or web address]      

## 2022-05-21 ENCOUNTER — Telehealth: Payer: Self-pay

## 2022-05-21 NOTE — Telephone Encounter (Signed)
Transition Care Management Unsuccessful Follow-up Telephone Call  Date of discharge and from where:  05/10/2022 [facility]  Attempts:  2nd Attempt  Reason for unsuccessful TCM follow-up call:  Left voice message  Melissa Wolfe Melissa Wolfe  [facility] Population [facility] Community Resource Care Guide   ??[email or web address]  ?? [phone]   Website: [email or web address]  [email or web address]      

## 2022-06-26 DIAGNOSIS — M1990 Unspecified osteoarthritis, unspecified site: Secondary | ICD-10-CM | POA: Diagnosis not present

## 2022-06-26 DIAGNOSIS — F329 Major depressive disorder, single episode, unspecified: Secondary | ICD-10-CM | POA: Diagnosis not present

## 2022-06-26 DIAGNOSIS — F1320 Sedative, hypnotic or anxiolytic dependence, uncomplicated: Secondary | ICD-10-CM | POA: Diagnosis not present

## 2022-06-26 DIAGNOSIS — R739 Hyperglycemia, unspecified: Secondary | ICD-10-CM | POA: Diagnosis not present

## 2022-06-26 DIAGNOSIS — E039 Hypothyroidism, unspecified: Secondary | ICD-10-CM | POA: Diagnosis not present

## 2022-06-26 DIAGNOSIS — E785 Hyperlipidemia, unspecified: Secondary | ICD-10-CM | POA: Diagnosis not present

## 2022-06-26 DIAGNOSIS — I10 Essential (primary) hypertension: Secondary | ICD-10-CM | POA: Diagnosis not present

## 2022-06-26 DIAGNOSIS — F1020 Alcohol dependence, uncomplicated: Secondary | ICD-10-CM | POA: Diagnosis not present

## 2022-07-12 DIAGNOSIS — M5416 Radiculopathy, lumbar region: Secondary | ICD-10-CM | POA: Diagnosis not present

## 2022-07-12 DIAGNOSIS — M5136 Other intervertebral disc degeneration, lumbar region: Secondary | ICD-10-CM | POA: Diagnosis not present

## 2022-07-12 DIAGNOSIS — M48062 Spinal stenosis, lumbar region with neurogenic claudication: Secondary | ICD-10-CM | POA: Diagnosis not present

## 2022-07-12 DIAGNOSIS — M4316 Spondylolisthesis, lumbar region: Secondary | ICD-10-CM | POA: Diagnosis not present

## 2022-07-12 DIAGNOSIS — M791 Myalgia, unspecified site: Secondary | ICD-10-CM | POA: Diagnosis not present

## 2022-07-12 DIAGNOSIS — M545 Low back pain, unspecified: Secondary | ICD-10-CM | POA: Diagnosis not present

## 2022-07-12 DIAGNOSIS — M47896 Other spondylosis, lumbar region: Secondary | ICD-10-CM | POA: Diagnosis not present

## 2022-07-25 DIAGNOSIS — M5416 Radiculopathy, lumbar region: Secondary | ICD-10-CM | POA: Diagnosis not present

## 2022-07-31 DIAGNOSIS — U071 COVID-19: Secondary | ICD-10-CM | POA: Diagnosis not present

## 2022-07-31 DIAGNOSIS — Z20822 Contact with and (suspected) exposure to covid-19: Secondary | ICD-10-CM | POA: Diagnosis not present

## 2022-08-21 DIAGNOSIS — Z1231 Encounter for screening mammogram for malignant neoplasm of breast: Secondary | ICD-10-CM | POA: Diagnosis not present

## 2022-08-21 DIAGNOSIS — Z01419 Encounter for gynecological examination (general) (routine) without abnormal findings: Secondary | ICD-10-CM | POA: Diagnosis not present

## 2022-08-21 DIAGNOSIS — Z6827 Body mass index (BMI) 27.0-27.9, adult: Secondary | ICD-10-CM | POA: Diagnosis not present

## 2022-08-23 DIAGNOSIS — E039 Hypothyroidism, unspecified: Secondary | ICD-10-CM | POA: Diagnosis not present

## 2022-08-23 DIAGNOSIS — G4700 Insomnia, unspecified: Secondary | ICD-10-CM | POA: Diagnosis not present

## 2022-08-23 DIAGNOSIS — M5459 Other low back pain: Secondary | ICD-10-CM | POA: Diagnosis not present

## 2022-08-23 DIAGNOSIS — R739 Hyperglycemia, unspecified: Secondary | ICD-10-CM | POA: Diagnosis not present

## 2022-08-23 DIAGNOSIS — F1020 Alcohol dependence, uncomplicated: Secondary | ICD-10-CM | POA: Diagnosis not present

## 2022-08-23 DIAGNOSIS — E785 Hyperlipidemia, unspecified: Secondary | ICD-10-CM | POA: Diagnosis not present

## 2022-08-23 DIAGNOSIS — F329 Major depressive disorder, single episode, unspecified: Secondary | ICD-10-CM | POA: Diagnosis not present

## 2022-08-23 DIAGNOSIS — I10 Essential (primary) hypertension: Secondary | ICD-10-CM | POA: Diagnosis not present

## 2022-08-23 DIAGNOSIS — M8580 Other specified disorders of bone density and structure, unspecified site: Secondary | ICD-10-CM | POA: Diagnosis not present

## 2022-10-11 DIAGNOSIS — M4316 Spondylolisthesis, lumbar region: Secondary | ICD-10-CM | POA: Diagnosis not present

## 2022-10-11 DIAGNOSIS — M48062 Spinal stenosis, lumbar region with neurogenic claudication: Secondary | ICD-10-CM | POA: Diagnosis not present

## 2022-10-11 DIAGNOSIS — M5416 Radiculopathy, lumbar region: Secondary | ICD-10-CM | POA: Diagnosis not present

## 2022-10-11 DIAGNOSIS — M545 Low back pain, unspecified: Secondary | ICD-10-CM | POA: Diagnosis not present

## 2022-10-11 DIAGNOSIS — M791 Myalgia, unspecified site: Secondary | ICD-10-CM | POA: Diagnosis not present

## 2022-10-11 DIAGNOSIS — M4804 Spinal stenosis, thoracic region: Secondary | ICD-10-CM | POA: Diagnosis not present

## 2022-10-11 DIAGNOSIS — M47896 Other spondylosis, lumbar region: Secondary | ICD-10-CM | POA: Diagnosis not present

## 2022-11-04 ENCOUNTER — Encounter (HOSPITAL_COMMUNITY): Payer: Self-pay

## 2022-11-04 ENCOUNTER — Other Ambulatory Visit: Payer: Self-pay

## 2022-11-04 ENCOUNTER — Emergency Department (HOSPITAL_COMMUNITY)
Admission: EM | Admit: 2022-11-04 | Discharge: 2022-11-04 | Disposition: A | Discharge status: Home / Self Care | Payer: Medicare HMO | Attending: Emergency Medicine | Admitting: Emergency Medicine

## 2022-11-04 DIAGNOSIS — Z79899 Other long term (current) drug therapy: Secondary | ICD-10-CM | POA: Insufficient documentation

## 2022-11-04 DIAGNOSIS — M5441 Lumbago with sciatica, right side: Secondary | ICD-10-CM | POA: Diagnosis not present

## 2022-11-04 DIAGNOSIS — I10 Essential (primary) hypertension: Secondary | ICD-10-CM | POA: Diagnosis not present

## 2022-11-04 DIAGNOSIS — M5431 Sciatica, right side: Secondary | ICD-10-CM | POA: Insufficient documentation

## 2022-11-04 DIAGNOSIS — M549 Dorsalgia, unspecified: Secondary | ICD-10-CM | POA: Diagnosis present

## 2022-11-04 DIAGNOSIS — F17210 Nicotine dependence, cigarettes, uncomplicated: Secondary | ICD-10-CM | POA: Diagnosis not present

## 2022-11-04 DIAGNOSIS — E039 Hypothyroidism, unspecified: Secondary | ICD-10-CM | POA: Insufficient documentation

## 2022-11-04 LAB — URINALYSIS, W/ REFLEX TO CULTURE (INFECTION SUSPECTED)
Bacteria, UA: NONE SEEN
Bilirubin Urine: NEGATIVE
Glucose, UA: NEGATIVE mg/dL
Ketones, ur: NEGATIVE mg/dL
Leukocytes,Ua: NEGATIVE
Nitrite: NEGATIVE
Protein, ur: NEGATIVE mg/dL
Specific Gravity, Urine: 1.012 (ref 1.005–1.030)
pH: 7 (ref 5.0–8.0)

## 2022-11-04 MED ORDER — ACETAMINOPHEN 500 MG PO TABS
1000.0000 mg | ORAL_TABLET | Freq: Once | ORAL | Status: DC
  Filled 2022-11-04: qty 2

## 2022-11-04 MED ORDER — OXYCODONE-ACETAMINOPHEN 5-325 MG PO TABS: 1.0000 | ORAL_TABLET | Freq: Four times a day (QID) | ORAL | 0 refills | Status: DC | PRN

## 2022-11-04 MED ORDER — HYDROMORPHONE HCL 1 MG/ML IJ SOLN
0.5000 mg | Freq: Once | INTRAMUSCULAR | Status: AC
  Administered 2022-11-04: 0.5 mg via INTRAVENOUS
  Filled 2022-11-04: qty 1

## 2022-11-04 MED ORDER — ONDANSETRON HCL 4 MG/2ML IJ SOLN
4.0000 mg | Freq: Once | INTRAMUSCULAR | Status: AC
Start: 2022-11-04 — End: 2022-11-04
  Administered 2022-11-04: 4 mg via INTRAVENOUS
  Filled 2022-11-04: qty 2

## 2022-11-04 MED ORDER — METHYLPREDNISOLONE 4 MG PO TBPK: ORAL_TABLET | ORAL | 0 refills | Status: DC

## 2022-11-04 MED ORDER — KETOROLAC TROMETHAMINE 30 MG/ML IJ SOLN
15.0000 mg | Freq: Once | INTRAMUSCULAR | Status: AC
Start: 2022-11-04 — End: 2022-11-04
  Administered 2022-11-04: 15 mg via INTRAVENOUS
  Filled 2022-11-04: qty 1

## 2022-11-04 NOTE — Discharge Instructions (Addendum)
You can take the Medrol Dosepak as instructed by the dose packaging.  You may use ibuprofen, 400 mg every 6 hours as needed.  You can also take 1 Percocet every 6 hours.  This medication does contain Tylenol.  Follow-up with your primary care doctor this week.  Return to the emergency department if you develop difficulty walking, or trouble going to the bathroom.

## 2022-11-04 NOTE — ED Provider Notes (Signed)
[place] EMERGENCY DEPARTMENT AT [facility] Provider Note  CSN: [id number] Arrival date & time: 11/04/22 1531  Chief Complaint(s) Sciatica  HPI Melissa Wolfe is a 72 y.o. female here today for back pain.  Patient says she has a history of sciatic back pain, has seen multiple specialist for this.  She is currently scheduled to get a TENS unit this Thursday.  She comes in today due to increasing pain in her back.  Denies any trauma, denies any fever, denies any weakness or numbness.   Past Medical History Past Medical History:  Diagnosis Date   Alcoholism (HCC)    Allergy    Depression    Hyperlipemia    Hypertension    Hypothyroid    Patient Active Problem List   Diagnosis Date Noted   Alcohol dependence with uncomplicated withdrawal (HCC)    Uncomplicated alcohol dependence (HCC)    Alcohol dependence (HCC) 06/23/2014   Depression, major, recurrent, moderate (HCC) 06/23/2014   Alcohol abuse with alcohol-induced disorder (HCC) 06/22/2014   Home Medication(s) Prior to Admission medications   Medication Sig Start Date End Date Taking? Authorizing Provider  methylPREDNISolone (MEDROL DOSEPAK) 4 MG TBPK tablet Take as instructed by dose packaging 11/04/22  Yes Anders Simmonds T, DO  oxyCODONE-acetaminophen (PERCOCET/ROXICET) 5-325 MG tablet Take 1 tablet by mouth every 6 (six) hours as needed for severe pain (pain score 7-10). 11/04/22  Yes Anders Simmonds T, DO  amLODipine (NORVASC) 2.5 MG tablet Take 2.5 mg by mouth daily. 08/07/21   [provider]  Ascorbic Acid (VITAMIN C PO) Take by mouth.    [provider]  atorvastatin (LIPITOR) 20 MG tablet Take 1 tablet (20 mg total) by mouth daily. For high cholesterol 06/28/14   Armandina Stammer I, NP  cloNIDine (CATAPRES) 0.1 MG tablet Take 1 tablet (0.1 mg total) by mouth 2 (two) times daily. For high blood pressure 06/29/14   Nwoko, Nicole Kindred I, NP  ELDERBERRY PO Take by mouth.    [provider]   fluticasone (FLONASE) 50 MCG/ACT nasal spray Place 1 spray into both nostrils daily.    [provider]  gabapentin (NEURONTIN) 300 MG capsule Take 300 mg by mouth 3 (three) times daily. 09/15/21   [provider]  hydrochlorothiazide (HYDRODIURIL) 25 MG tablet Take 25 mg by mouth as needed. Patient not taking: Reported on 08/23/2021    [provider]  levothyroxine (SYNTHROID, LEVOTHROID) 137 MCG tablet Take 1 tablet (137 mcg total) by mouth daily before breakfast. For low thyroid function 06/28/14   Armandina Stammer I, NP  LORazepam (ATIVAN) 1 MG tablet  06/05/15   [provider]  losartan (COZAAR) 100 MG tablet Take by mouth. 07/03/21   [provider]  Multiple Vitamin (MULTIVITAMIN) tablet Take 1 tablet by mouth daily. Patient not taking: Reported on 09/28/2021    [provider]  Multiple Vitamins-Minerals (ZINC PO) Take by mouth.    [provider]  naproxen (NAPROSYN) 500 MG tablet     [provider]  Nutritional Supplements (JUICE PLUS FIBRE PO) Juice Plus    [provider]  Omega-3 Fatty Acids (OMEGA 3 PO) Take 1,000 mg by mouth daily.    [provider]  OVER THE COUNTER MEDICATION Take 1 tablet by mouth daily. Sea moss    [provider]  Past Surgical History Past Surgical History:  Procedure Laterality Date   BREAST EXCISIONAL BIOPSY Left 2008   benign   COLONOSCOPY     fallopian ablation     HAND SURGERY Left 05/2015   MYOMECTOMY     x2   OOPHORECTOMY Right    POLYPECTOMY     Family History Family History  Problem Relation Age of Onset   Colon cancer Neg Hx    Colon polyps Neg Hx    Esophageal cancer Neg Hx    Rectal cancer Neg Hx    Stomach cancer Neg Hx     Social History Social History   Tobacco Use   Smoking status: Some Days    Current  packs/day: 0.10    Types: Cigarettes   Smokeless tobacco: Never   Tobacco comments:    0-2 cigs a day   Substance Use Topics   Alcohol use: No    Alcohol/week: 0.0 standard drinks of alcohol    Comment: OCCASSIONALLY   Drug use: No   Allergies Codeine and Cephalexin  Review of Systems Review of Systems  Physical Exam Vital Signs  I have reviewed the triage vital signs BP (!) 190/100 (BP Location: Left Arm)   Pulse (!) 55   Temp 98.1 F (36.7 C)   Resp 18   Ht 4\' 11"  (1.499 m)   SpO2 100%   BMI 32.32 kg/m   Physical Exam Constitutional:      Appearance: She is not toxic-appearing.  Cardiovascular:     Rate and Rhythm: Normal rate.  Neurological:     General: No focal deficit present.     Mental Status: She is alert.     Gait: Gait normal.     Comments: No weakness in the lower extremities, no saddle anesthesia, no numbness.     ED Results and Treatments Labs (all labs ordered are listed, but only abnormal results are displayed) Labs Reviewed  URINALYSIS, W/ REFLEX TO CULTURE (INFECTION SUSPECTED) - Abnormal; Notable for the following components:      Result Value   Color, Urine STRAW (*)    Hgb urine dipstick SMALL (*)    All other components within normal limits                                                                                                                          Radiology No results found.  Pertinent labs & imaging results that were available during my care of the patient were reviewed by me and considered in my medical decision making (see MDM for details).  Medications Ordered in ED Medications  acetaminophen (TYLENOL) tablet 1,000 mg (1,000 mg Oral Not Given 11/04/22 1644)  ketorolac (TORADOL) 30 MG/ML injection 15 mg (has no administration in time range)  HYDROmorphone (DILAUDID) injection 0.5 mg (0.5 mg Intravenous Given 11/04/22 1630)  ondansetron (ZOFRAN) injection 4 mg (4 mg Intravenous Given 11/04/22 1642)  Procedures Procedures  (including critical care time)  Medical Decision Making / ED Course   This patient presents to the ED for concern of back pain, this involves an extensive number of treatment options, and is a complaint that carries with it a high risk of complications and morbidity.  The differential diagnosis includes chronic back pain, Pilo nephritis, less likely cauda equina, less likely spinal epidural abscess.  MDM: 72 year old female with chronic back pain, here today for an acute exacerbation of her back pain.  Considered imaging on the patient, however she has already had an extensive outpatient workup.  Her symptoms are consistent with musculoskeletal back pain.  Will symptomatically treat here in the emergency department.  Urinalysis ordered to assess for pyelonephritis, although I think this is less likely given her symptoms.  Reassessment-6:15 PM.  Patient's urinalysis negative for infection.  Symptoms have improved with medications provided here in the emergency department.  Will discharge patient with steroid Dosepak, and a few oxycodones for breakthrough pain.  Patient will follow-up with her PCP.    Lab Tests: -I ordered, reviewed, and interpreted labs.   The pertinent results include:   Labs Reviewed  URINALYSIS, W/ REFLEX TO CULTURE (INFECTION SUSPECTED) - Abnormal; Notable for the following components:      Result Value   Color, Urine STRAW (*)    Hgb urine dipstick SMALL (*)    All other components within normal limits      EKG   EKG Interpretation Date/Time:    Ventricular Rate:    PR Interval:    QRS Duration:    QT Interval:    QTC Calculation:   R Axis:      Text Interpretation:           Imaging Studies ordered: Considered order and imaging studies, however with the patient's history and exam did not believe they are  indicated.   Medicines ordered and prescription drug management: Meds ordered this encounter  Medications   HYDROmorphone (DILAUDID) injection 0.5 mg   acetaminophen (TYLENOL) tablet 1,000 mg   ondansetron (ZOFRAN) injection 4 mg   ketorolac (TORADOL) 30 MG/ML injection 15 mg   methylPREDNISolone (MEDROL DOSEPAK) 4 MG TBPK tablet    Sig: Take as instructed by dose packaging    Dispense:  21 each    Refill:  0   oxyCODONE-acetaminophen (PERCOCET/ROXICET) 5-325 MG tablet    Sig: Take 1 tablet by mouth every 6 (six) hours as needed for severe pain (pain score 7-10).    Dispense:  6 tablet    Refill:  0    -I have reviewed the patients home medicines and have made adjustments as needed    Social Determinants of Health:  Factors impacting patients care include: Medical comorbidities including chronic back pain with poor control of symptoms.   Reevaluation: After the interventions noted above, I reevaluated the patient and found that they have :improved  Co morbidities that complicate the patient evaluation  Past Medical History:  Diagnosis Date   Alcoholism (HCC)    Allergy    Depression    Hyperlipemia    Hypertension    Hypothyroid       Dispostion: I considered admission for this patient, however patient's symptoms improved with symptomatic management.     Final Clinical Impression(s) / ED Diagnoses Final diagnoses:  Sciatica of right side     @PCDICTATION @    Anders Simmonds T, DO 11/04/22 1826

## 2022-11-04 NOTE — ED Triage Notes (Signed)
Pt arrived POV from home c/o lower back pain that runs down her right leg, pt states she has sciatic nerve damage but she cannot take the pain today she is unable to sit, stand or lay she is so uncomfortable.

## 2022-11-04 NOTE — ED Notes (Signed)
This RN reviewed discharge instructions with patient. She verbalized understanding and denied any further questions. PT well appearing upon discharge and reports tolerable pain. Pt wheeled to exit. Pt endorses ride home.  

## 2022-11-14 ENCOUNTER — Encounter (HOSPITAL_COMMUNITY): Payer: Self-pay

## 2022-11-14 ENCOUNTER — Emergency Department (HOSPITAL_COMMUNITY)
Admission: EM | Admit: 2022-11-14 | Discharge: 2022-11-14 | Discharge status: Against Medical Advice | Payer: Medicare HMO | Attending: Emergency Medicine | Admitting: Emergency Medicine

## 2022-11-14 ENCOUNTER — Other Ambulatory Visit: Payer: Self-pay

## 2022-11-14 DIAGNOSIS — Z5321 Procedure and treatment not carried out due to patient leaving prior to being seen by health care provider: Secondary | ICD-10-CM | POA: Insufficient documentation

## 2022-11-14 DIAGNOSIS — M79605 Pain in left leg: Secondary | ICD-10-CM | POA: Diagnosis not present

## 2022-11-14 NOTE — ED Triage Notes (Signed)
Patient arrived POV from home with complaint of left sciatica pain x 2 weeks, has been getting treatment from cortisone shots from [facility].   Reports seen at [facility] for treatment but was told unable to give medication for pain due to patient under pain management plan.

## 2022-11-14 NOTE — ED Notes (Signed)
Pt said she is leaving. Pt will go see primary doctor. I told pt I was taking her to see Doctor, pt told me she was leaving.

## 2022-11-22 DIAGNOSIS — M4804 Spinal stenosis, thoracic region: Secondary | ICD-10-CM | POA: Diagnosis not present

## 2022-12-04 ENCOUNTER — Encounter (HOSPITAL_COMMUNITY): Payer: Self-pay

## 2022-12-04 ENCOUNTER — Emergency Department (HOSPITAL_COMMUNITY): Payer: Medicare HMO

## 2022-12-04 ENCOUNTER — Emergency Department (HOSPITAL_COMMUNITY)
Admission: EM | Admit: 2022-12-04 | Discharge: 2022-12-05 | Disposition: A | Discharge status: Home / Self Care | Payer: Medicare HMO | Attending: Emergency Medicine | Admitting: Emergency Medicine

## 2022-12-04 ENCOUNTER — Other Ambulatory Visit: Payer: Self-pay

## 2022-12-04 DIAGNOSIS — M5430 Sciatica, unspecified side: Secondary | ICD-10-CM | POA: Insufficient documentation

## 2022-12-04 DIAGNOSIS — M79652 Pain in left thigh: Secondary | ICD-10-CM | POA: Diagnosis not present

## 2022-12-04 DIAGNOSIS — M5442 Lumbago with sciatica, left side: Secondary | ICD-10-CM | POA: Diagnosis not present

## 2022-12-04 DIAGNOSIS — M79651 Pain in right thigh: Secondary | ICD-10-CM | POA: Diagnosis not present

## 2022-12-04 DIAGNOSIS — M25552 Pain in left hip: Secondary | ICD-10-CM | POA: Diagnosis present

## 2022-12-04 LAB — URINALYSIS, W/ REFLEX TO CULTURE (INFECTION SUSPECTED)
Bilirubin Urine: NEGATIVE
Glucose, UA: NEGATIVE mg/dL
Hgb urine dipstick: NEGATIVE
Ketones, ur: NEGATIVE mg/dL
Nitrite: NEGATIVE
Protein, ur: NEGATIVE mg/dL
Specific Gravity, Urine: 1.016 (ref 1.005–1.030)
pH: 5 (ref 5.0–8.0)

## 2022-12-04 LAB — CBC WITH DIFFERENTIAL/PLATELET
Abs Immature Granulocytes: 0.03 10*3/uL (ref 0.00–0.07)
Basophils Absolute: 0.1 10*3/uL (ref 0.0–0.1)
Basophils Relative: 1 %
Eosinophils Absolute: 0.3 10*3/uL (ref 0.0–0.5)
Eosinophils Relative: 7 %
HCT: 41 % (ref 36.0–46.0)
Hemoglobin: 13.5 g/dL (ref 12.0–15.0)
Immature Granulocytes: 1 %
Lymphocytes Relative: 22 %
Lymphs Abs: 1.1 10*3/uL (ref 0.7–4.0)
MCH: 30.8 pg (ref 26.0–34.0)
MCHC: 32.9 g/dL (ref 30.0–36.0)
MCV: 93.6 fL (ref 80.0–100.0)
Monocytes Absolute: 0.7 10*3/uL (ref 0.1–1.0)
Monocytes Relative: 13 %
Neutro Abs: 2.9 10*3/uL (ref 1.7–7.7)
Neutrophils Relative %: 56 %
Platelets: 278 10*3/uL (ref 150–400)
RBC: 4.38 MIL/uL (ref 3.87–5.11)
RDW: 12.6 % (ref 11.5–15.5)
WBC: 5.1 10*3/uL (ref 4.0–10.5)
nRBC: 0 % (ref 0.0–0.2)

## 2022-12-04 LAB — BASIC METABOLIC PANEL
Anion gap: 11 (ref 5–15)
BUN: 32 mg/dL — ABNORMAL HIGH (ref 8–23)
CO2: 21 mmol/L — ABNORMAL LOW (ref 22–32)
Calcium: 9.6 mg/dL (ref 8.9–10.3)
Chloride: 110 mmol/L (ref 98–111)
Creatinine, Ser: 1.16 mg/dL — ABNORMAL HIGH (ref 0.44–1.00)
GFR, Estimated: 49 mL/min — ABNORMAL LOW (ref >60)
Glucose, Bld: 113 mg/dL — ABNORMAL HIGH (ref 70–99)
Potassium: 4.2 mmol/L (ref 3.5–5.1)
Sodium: 142 mmol/L (ref 135–145)

## 2022-12-04 MED ORDER — OXYCODONE HCL 5 MG PO TABS
10.0000 mg | ORAL_TABLET | Freq: Once | ORAL | Status: AC
  Administered 2022-12-04: 10 mg via ORAL
  Filled 2022-12-04: qty 2
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
## 2022-12-04 NOTE — ED Provider Triage Note (Signed)
Emergency Medicine Provider Triage Evaluation Note  Melissa Wolfe , a 72 y.o. female  was evaluated in triage.  Pt complains of acute on chronic low back pain.  Review of Systems  Positive: Low back pain Negative: No reported fever, abdominal pain, lower extremity weakness  Physical Exam  BP (!) 187/78 (BP Location: Left Arm)   Pulse 65   Temp 97.9 F (36.6 C) (Oral)   Resp 16   Ht 4\' 11"  (1.499 m)   Wt 56.7 kg   SpO2 97%   BMI 25.25 kg/m  Gen:   Awake, in moderate discomfort secondary to pain Resp:  Normal effort  MSK:   Moves extremities without difficulty  Other:    Medical Decision Making  Medically screening exam initiated at 12:45 PM.  Appropriate orders placed.  RAGINI MEHLHAFF was informed that the remainder of the evaluation will be completed by another provider, this initial triage assessment does not replace that evaluation, and the importance of remaining in the ED until their evaluation is complete.  Initial labs and screening imaging ordered.  Patient understands MSE process.  RN staff aware of plan of care.   Wynetta Fines, MD 12/04/22 [phone]
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
## 2022-12-04 NOTE — ED Triage Notes (Signed)
Pt BIB EMS from home for left hip pain, pt has hx of same. Axox4. VSS.

## 2022-12-04 NOTE — ED Notes (Signed)
Attempted to get vitals. Patient declined at this time. Asked the patient a second time. Patient just opened her eyes and closed them again.

## 2022-12-05 MED ORDER — KETOROLAC TROMETHAMINE 60 MG/2ML IM SOLN
30.0000 mg | Freq: Once | INTRAMUSCULAR | Status: AC
  Administered 2022-12-05: 30 mg via INTRAMUSCULAR
  Filled 2022-12-05: qty 2

## 2022-12-05 MED ORDER — ONDANSETRON 4 MG PO TBDP
8.0000 mg | ORAL_TABLET | Freq: Once | ORAL | Status: AC
  Administered 2022-12-05: 8 mg via ORAL
  Filled 2022-12-05: qty 2

## 2022-12-05 MED ORDER — PREDNISONE 20 MG PO TABS: ORAL_TABLET | ORAL | 0 refills | Status: DC

## 2022-12-05 MED ORDER — LIDOCAINE 5 % EX PTCH
2.0000 | MEDICATED_PATCH | CUTANEOUS | Status: DC
  Administered 2022-12-05: 2 via TRANSDERMAL
  Filled 2022-12-05: qty 2

## 2022-12-05 MED ORDER — LIDOCAINE 5 % EX PTCH: 1.0000 | MEDICATED_PATCH | CUTANEOUS | 0 refills | Status: DC

## 2022-12-05 MED ORDER — DEXAMETHASONE SODIUM PHOSPHATE 4 MG/ML IJ SOLN
4.0000 mg | Freq: Once | INTRAMUSCULAR | Status: AC
  Administered 2022-12-05: 4 mg via INTRAMUSCULAR
  Filled 2022-12-05: qty 1

## 2022-12-05 MED ORDER — LIDOCAINE 5 % EX PTCH: 1.0000 | MEDICATED_PATCH | CUTANEOUS | 0 refills | Status: AC

## 2022-12-05 NOTE — ED Notes (Signed)
Prior to medicating the patient, pt was telling this RN how she was "sick of this shit and this doctor and her title does not mean anything to me" and then the pt expressed how she was "the wrong one to mess with". MD notified about pt's comments.

## 2022-12-05 NOTE — ED Provider Notes (Signed)
[place] EMERGENCY DEPARTMENT AT [facility] Provider Note   CSN: [id number] Arrival date & time: 12/04/22  1226     History  Chief Complaint  Patient presents with   Hip Pain    Melissa Wolfe is a 72 y.o. female.  The history is provided by the patient.  Hip Pain This is a chronic problem. The current episode started more than 1 week ago. The problem occurs constantly. The problem has not changed since onset.Pertinent negatives include no chest pain, no abdominal pain, no headaches and no shortness of breath. Nothing aggravates the symptoms. Nothing relieves the symptoms. Treatments tried: home gabapentin. The treatment provided no relief.  Patient with known sciatica presents with bilateral ongoing hip pain.  Patient sees sports medicine and pain management and states that the medications are not helping.       Home Medications Prior to Admission medications   Medication Sig Start Date End Date Taking? Authorizing Provider  amLODipine (NORVASC) 2.5 MG tablet Take 2.5 mg by mouth daily. 08/07/21   [provider]  Ascorbic Acid (VITAMIN C PO) Take by mouth.    [provider]  atorvastatin (LIPITOR) 20 MG tablet Take 1 tablet (20 mg total) by mouth daily. For high cholesterol 06/28/14   Armandina Stammer I, NP  cloNIDine (CATAPRES) 0.1 MG tablet Take 1 tablet (0.1 mg total) by mouth 2 (two) times daily. For high blood pressure 06/29/14   Nwoko, Nicole Kindred I, NP  ELDERBERRY PO Take by mouth.    [provider]  fluticasone (FLONASE) 50 MCG/ACT nasal spray Place 1 spray into both nostrils daily.    [provider]  gabapentin (NEURONTIN) 300 MG capsule Take 300 mg by mouth 3 (three) times daily. 09/15/21   [provider]  hydrochlorothiazide (HYDRODIURIL) 25 MG tablet Take 25 mg by mouth as needed. Patient not taking: Reported on 08/23/2021    [provider]  levothyroxine (SYNTHROID, LEVOTHROID) 137 MCG tablet Take 1 tablet  (137 mcg total) by mouth daily before breakfast. For low thyroid function 06/28/14   Armandina Stammer I, NP  lidocaine (LIDODERM) 5 % Place 1 patch onto the skin daily. Remove & Discard patch within 12 hours or as directed by MD 12/05/22   Nicanor Alcon, Willoughby Doell, MD  LORazepam (ATIVAN) 1 MG tablet  06/05/15   [provider]  losartan (COZAAR) 100 MG tablet Take by mouth. 07/03/21   [provider]  methylPREDNISolone (MEDROL DOSEPAK) 4 MG TBPK tablet Take as instructed by dose packaging 11/04/22   Anders Simmonds T, DO  Multiple Vitamin (MULTIVITAMIN) tablet Take 1 tablet by mouth daily. Patient not taking: Reported on 09/28/2021    [provider]  Multiple Vitamins-Minerals (ZINC PO) Take by mouth.    [provider]  naproxen (NAPROSYN) 500 MG tablet     [provider]  Nutritional Supplements (JUICE PLUS FIBRE PO) Juice Plus    [provider]  Omega-3 Fatty Acids (OMEGA 3 PO) Take 1,000 mg by mouth daily.    [provider]  OVER THE COUNTER MEDICATION Take 1 tablet by mouth daily. Sea moss    [provider]  oxyCODONE-acetaminophen (PERCOCET/ROXICET) 5-325 MG tablet Take 1 tablet by mouth every 6 (six) hours as needed for severe pain (pain score 7-10). 11/04/22   Arletha Pili, DO  predniSONE (DELTASONE) 20 MG tablet 3 tabs po day one, then 2 po daily x 4 days 12/05/22   Nicanor Alcon, Shalawn Wynder, MD  Allergies    Codeine and Cephalexin    Review of Systems   Review of Systems  Respiratory:  Negative for shortness of breath.   Cardiovascular:  Negative for chest pain.  Gastrointestinal:  Negative for abdominal pain.  Musculoskeletal:  Positive for arthralgias.  Neurological:  Negative for headaches.  All other systems reviewed and are negative.   Physical Exam Updated Vital Signs BP (!) 167/86   Pulse 90   Temp 98.6 F (37 C) (Oral)   Resp 18   Ht 4\' 11"  (1.499 m)   Wt 56.7 kg   SpO2 100%   BMI 25.25 kg/m  Physical  Exam Vitals and nursing note reviewed.  Constitutional:      General: She is not in acute distress.    Appearance: Normal appearance. She is well-developed.  HENT:     Head: Normocephalic and atraumatic.     Nose: Nose normal.  Eyes:     Pupils: Pupils are equal, round, and reactive to light.  Cardiovascular:     Rate and Rhythm: Normal rate and regular rhythm.     Pulses: Normal pulses.     Heart sounds: Normal heart sounds.  Pulmonary:     Effort: Pulmonary effort is normal. No respiratory distress.     Breath sounds: Normal breath sounds.  Abdominal:     General: Bowel sounds are normal. There is no distension.     Palpations: Abdomen is soft.     Tenderness: There is no abdominal tenderness. There is no guarding or rebound.  Musculoskeletal:        General: Normal range of motion.     Cervical back: Neck supple.  Skin:    General: Skin is warm and dry.     Capillary Refill: Capillary refill takes less than 2 seconds.     Findings: No erythema or rash.  Neurological:     General: No focal deficit present.     Mental Status: She is alert.     Deep Tendon Reflexes: Reflexes normal.  Psychiatric:        Mood and Affect: Mood normal.     ED Results / Procedures / Treatments   Labs (all labs ordered are listed, but only abnormal results are displayed) Results for orders placed or performed during the hospital encounter of 12/04/22  CBC with Differential  Result Value Ref Range   WBC 5.1 4.0 - 10.5 K/uL   RBC 4.38 3.87 - 5.11 MIL/uL   Hemoglobin 13.5 12.0 - 15.0 g/dL   HCT 16.1 09.6 - 04.5 %   MCV 93.6 80.0 - 100.0 fL   MCH 30.8 26.0 - 34.0 pg   MCHC 32.9 30.0 - 36.0 g/dL   RDW 40.9 81.1 - 91.4 %   Platelets 278 150 - 400 K/uL   nRBC 0.0 0.0 - 0.2 %   Neutrophils Relative % 56 %   Neutro Abs 2.9 1.7 - 7.7 K/uL   Lymphocytes Relative 22 %   Lymphs Abs 1.1 0.7 - 4.0 K/uL   Monocytes Relative 13 %   Monocytes Absolute 0.7 0.1 - 1.0 K/uL   Eosinophils Relative 7 %    Eosinophils Absolute 0.3 0.0 - 0.5 K/uL   Basophils Relative 1 %   Basophils Absolute 0.1 0.0 - 0.1 K/uL   Immature Granulocytes 1 %   Abs Immature Granulocytes 0.03 0.00 - 0.07 K/uL  Basic metabolic panel  Result Value Ref Range   Sodium 142 135 - 145 mmol/L   Potassium  4.2 3.5 - 5.1 mmol/L   Chloride 110 98 - 111 mmol/L   CO2 21 (L) 22 - 32 mmol/L   Glucose, Bld 113 (H) 70 - 99 mg/dL   BUN 32 (H) 8 - 23 mg/dL   Creatinine, Ser 1.61 (H) 0.44 - 1.00 mg/dL   Calcium 9.6 8.9 - 09.6 mg/dL   GFR, Estimated 49 (L) >60 mL/min   Anion gap 11 5 - 15  Urinalysis, w/ Reflex to Culture (Infection Suspected) -Urine, Clean Catch  Result Value Ref Range   Specimen Source URINE, CLEAN CATCH    Color, Urine YELLOW YELLOW   APPearance CLEAR CLEAR   Specific Gravity, Urine 1.016 1.005 - 1.030   pH 5.0 5.0 - 8.0   Glucose, UA NEGATIVE NEGATIVE mg/dL   Hgb urine dipstick NEGATIVE NEGATIVE   Bilirubin Urine NEGATIVE NEGATIVE   Ketones, ur NEGATIVE NEGATIVE mg/dL   Protein, ur NEGATIVE NEGATIVE mg/dL   Nitrite NEGATIVE NEGATIVE   Leukocytes,Ua SMALL (A) NEGATIVE   RBC / HPF 0-5 0 - 5 RBC/hpf   WBC, UA 11-20 0 - 5 WBC/hpf   Bacteria, UA RARE (A) NONE SEEN   Squamous Epithelial / HPF 6-10 0 - 5 /HPF   Hyaline Casts, UA PRESENT    No results found.  Radiology No results found.  Procedures Procedures    Medications Ordered in ED Medications  lidocaine (LIDODERM) 5 % 2 patch (2 patches Transdermal Patch Applied 12/05/22 0240)  oxyCODONE (Oxy IR/ROXICODONE) immediate release tablet 10 mg (10 mg Oral Given 12/04/22 1321)  ketorolac (TORADOL) injection 30 mg (30 mg Intramuscular Given 12/05/22 0237)  ondansetron (ZOFRAN-ODT) disintegrating tablet 8 mg (8 mg Oral Given 12/05/22 0404)  dexamethasone (DECADRON) injection 4 mg (4 mg Intramuscular Given 12/05/22 0405)    ED Course/ Medical Decision Making/ A&P                                 Medical Decision Making Patient with known sciatica,  no trauma presents with ongoing pain   Amount and/or Complexity of Data Reviewed External Data Reviewed: notes.    Details: Previous notes reviewed  Labs: ordered.    Details: White count is normal 5.1, hemoglobin is normal 13.5, normal platelets.  Normal sodium 142, normal potassium 4.2, creatinine slight elevation 1.16   Risk Prescription drug management. Risk Details: Patient has chronic pain for which she is seen by both pain management and sports medicine.  I have advised close follow up with both for ongoing care.      Final Clinical Impression(s) / ED Diagnoses Final diagnoses:  Sciatica, unspecified laterality    I have reviewed the triage vital signs and the nursing notes. Pertinent labs & imaging results that were available during my care of the patient were reviewed by me and considered in my medical decision making (see chart for details). After history, exam, and medical workup I feel the patient has been appropriately medically screened and is safe for discharge home. Pertinent diagnoses were discussed with the patient. Patient was given return precautions.    Pharmacy changed at patient's request      Mikailah Morel, MD 12/05/22 [phone]

## 2022-12-05 NOTE — ED Provider Notes (Addendum)
Patient was aggressive with staff in triage and slapped tech.  She was curt with me stating we were not doing anything for her pain.  I explained that she had been given medication but was going to need to follow up with pain management for changes to her home pain medication regimen.  I have advised speaking with sports medicine regarding injections or additional therapies for ongoing pain.  I was polite.  Nurse reports that the pain was angry with EDP made threatening comments.  Patient has chronic pain and changing her regimen would be a violation of her pain contract.         Tiwatope Emmitt, MD 12/05/22 [phone]

## 2022-12-09 ENCOUNTER — Inpatient Hospital Stay (HOSPITAL_COMMUNITY)
Admission: EM | Admit: 2022-12-09 | Discharge: 2022-12-17 | DRG: 092 | Payer: Medicare HMO | Attending: Family Medicine | Admitting: Family Medicine

## 2022-12-09 ENCOUNTER — Emergency Department (HOSPITAL_COMMUNITY): Payer: Medicare HMO

## 2022-12-09 ENCOUNTER — Other Ambulatory Visit: Payer: Self-pay

## 2022-12-09 ENCOUNTER — Encounter (HOSPITAL_COMMUNITY): Payer: Self-pay | Admitting: Emergency Medicine

## 2022-12-09 DIAGNOSIS — I6782 Cerebral ischemia: Secondary | ICD-10-CM | POA: Diagnosis not present

## 2022-12-09 DIAGNOSIS — F4322 Adjustment disorder with anxiety: Secondary | ICD-10-CM | POA: Diagnosis present

## 2022-12-09 DIAGNOSIS — T426X5A Adverse effect of other antiepileptic and sedative-hypnotic drugs, initial encounter: Secondary | ICD-10-CM | POA: Diagnosis present

## 2022-12-09 DIAGNOSIS — N3000 Acute cystitis without hematuria: Secondary | ICD-10-CM | POA: Diagnosis not present

## 2022-12-09 DIAGNOSIS — F17210 Nicotine dependence, cigarettes, uncomplicated: Secondary | ICD-10-CM | POA: Diagnosis present

## 2022-12-09 DIAGNOSIS — R5381 Other malaise: Secondary | ICD-10-CM | POA: Diagnosis present

## 2022-12-09 DIAGNOSIS — F1020 Alcohol dependence, uncomplicated: Secondary | ICD-10-CM

## 2022-12-09 DIAGNOSIS — E039 Hypothyroidism, unspecified: Secondary | ICD-10-CM | POA: Diagnosis present

## 2022-12-09 DIAGNOSIS — I10 Essential (primary) hypertension: Secondary | ICD-10-CM | POA: Diagnosis not present

## 2022-12-09 DIAGNOSIS — Z791 Long term (current) use of non-steroidal anti-inflammatories (NSAID): Secondary | ICD-10-CM

## 2022-12-09 DIAGNOSIS — B957 Other staphylococcus as the cause of diseases classified elsewhere: Secondary | ICD-10-CM | POA: Diagnosis present

## 2022-12-09 DIAGNOSIS — R944 Abnormal results of kidney function studies: Secondary | ICD-10-CM | POA: Diagnosis present

## 2022-12-09 DIAGNOSIS — G8929 Other chronic pain: Secondary | ICD-10-CM | POA: Diagnosis present

## 2022-12-09 DIAGNOSIS — Z881 Allergy status to other antibiotic agents status: Secondary | ICD-10-CM

## 2022-12-09 DIAGNOSIS — Z885 Allergy status to narcotic agent status: Secondary | ICD-10-CM

## 2022-12-09 DIAGNOSIS — R7881 Bacteremia: Secondary | ICD-10-CM | POA: Diagnosis not present

## 2022-12-09 DIAGNOSIS — F331 Major depressive disorder, recurrent, moderate: Secondary | ICD-10-CM | POA: Diagnosis present

## 2022-12-09 DIAGNOSIS — M25552 Pain in left hip: Secondary | ICD-10-CM | POA: Diagnosis present

## 2022-12-09 DIAGNOSIS — G9341 Metabolic encephalopathy: Principal | ICD-10-CM

## 2022-12-09 DIAGNOSIS — I959 Hypotension, unspecified: Secondary | ICD-10-CM | POA: Diagnosis not present

## 2022-12-09 DIAGNOSIS — W19XXXA Unspecified fall, initial encounter: Secondary | ICD-10-CM

## 2022-12-09 DIAGNOSIS — E876 Hypokalemia: Secondary | ICD-10-CM

## 2022-12-09 DIAGNOSIS — I700 Atherosclerosis of aorta: Secondary | ICD-10-CM | POA: Diagnosis not present

## 2022-12-09 DIAGNOSIS — Z79890 Hormone replacement therapy (postmenopausal): Secondary | ICD-10-CM

## 2022-12-09 DIAGNOSIS — I129 Hypertensive chronic kidney disease with stage 1 through stage 4 chronic kidney disease, or unspecified chronic kidney disease: Secondary | ICD-10-CM | POA: Diagnosis present

## 2022-12-09 DIAGNOSIS — W1830XA Fall on same level, unspecified, initial encounter: Secondary | ICD-10-CM | POA: Diagnosis not present

## 2022-12-09 DIAGNOSIS — E7800 Pure hypercholesterolemia, unspecified: Secondary | ICD-10-CM | POA: Diagnosis present

## 2022-12-09 DIAGNOSIS — T424X5A Adverse effect of benzodiazepines, initial encounter: Secondary | ICD-10-CM | POA: Diagnosis present

## 2022-12-09 DIAGNOSIS — G4710 Hypersomnia, unspecified: Secondary | ICD-10-CM | POA: Diagnosis present

## 2022-12-09 DIAGNOSIS — G928 Other toxic encephalopathy: Secondary | ICD-10-CM | POA: Diagnosis present

## 2022-12-09 DIAGNOSIS — Z634 Disappearance and death of family member: Secondary | ICD-10-CM | POA: Diagnosis not present

## 2022-12-09 DIAGNOSIS — Z87440 Personal history of urinary (tract) infections: Secondary | ICD-10-CM | POA: Diagnosis not present

## 2022-12-09 DIAGNOSIS — Z79899 Other long term (current) drug therapy: Secondary | ICD-10-CM

## 2022-12-09 DIAGNOSIS — N1831 Chronic kidney disease, stage 3a: Secondary | ICD-10-CM | POA: Diagnosis present

## 2022-12-09 DIAGNOSIS — G9340 Encephalopathy, unspecified: Secondary | ICD-10-CM | POA: Diagnosis not present

## 2022-12-09 DIAGNOSIS — Y92230 Patient room in hospital as the place of occurrence of the external cause: Secondary | ICD-10-CM | POA: Diagnosis not present

## 2022-12-09 DIAGNOSIS — R45851 Suicidal ideations: Secondary | ICD-10-CM | POA: Diagnosis not present

## 2022-12-09 DIAGNOSIS — R569 Unspecified convulsions: Secondary | ICD-10-CM | POA: Diagnosis not present

## 2022-12-09 DIAGNOSIS — R4182 Altered mental status, unspecified: Secondary | ICD-10-CM | POA: Diagnosis present

## 2022-12-09 DIAGNOSIS — R410 Disorientation, unspecified: Secondary | ICD-10-CM | POA: Diagnosis not present

## 2022-12-09 DIAGNOSIS — R0689 Other abnormalities of breathing: Secondary | ICD-10-CM | POA: Diagnosis not present

## 2022-12-09 LAB — CBC WITH DIFFERENTIAL/PLATELET
Abs Immature Granulocytes: 0.08 10*3/uL — ABNORMAL HIGH (ref 0.00–0.07)
Basophils Absolute: 0.1 10*3/uL (ref 0.0–0.1)
Basophils Relative: 1 %
Eosinophils Absolute: 0.3 10*3/uL (ref 0.0–0.5)
Eosinophils Relative: 4 %
HCT: 40.2 % (ref 36.0–46.0)
Hemoglobin: 13.5 g/dL (ref 12.0–15.0)
Immature Granulocytes: 1 %
Lymphocytes Relative: 18 %
Lymphs Abs: 1.4 10*3/uL (ref 0.7–4.0)
MCH: 31.4 pg (ref 26.0–34.0)
MCHC: 33.6 g/dL (ref 30.0–36.0)
MCV: 93.5 fL (ref 80.0–100.0)
Monocytes Absolute: 1 10*3/uL (ref 0.1–1.0)
Monocytes Relative: 12 %
Neutro Abs: 5.1 10*3/uL (ref 1.7–7.7)
Neutrophils Relative %: 64 %
Platelets: 235 10*3/uL (ref 150–400)
RBC: 4.3 MIL/uL (ref 3.87–5.11)
RDW: 12.8 % (ref 11.5–15.5)
WBC: 8 10*3/uL (ref 4.0–10.5)
nRBC: 0 % (ref 0.0–0.2)

## 2022-12-09 LAB — URINALYSIS, W/ REFLEX TO CULTURE (INFECTION SUSPECTED)
Bilirubin Urine: NEGATIVE
Glucose, UA: NEGATIVE mg/dL
Hgb urine dipstick: NEGATIVE
Ketones, ur: 5 mg/dL — AB
Nitrite: NEGATIVE
Protein, ur: NEGATIVE mg/dL
Specific Gravity, Urine: 1.02 (ref 1.005–1.030)
pH: 5 (ref 5.0–8.0)

## 2022-12-09 LAB — RAPID URINE DRUG SCREEN, HOSP PERFORMED
Amphetamines: NOT DETECTED
Barbiturates: NOT DETECTED
Benzodiazepines: POSITIVE — AB
Cocaine: NOT DETECTED
Opiates: NOT DETECTED
Tetrahydrocannabinol: POSITIVE — AB

## 2022-12-09 LAB — ETHANOL: Alcohol, Ethyl (B): <10 mg/dL (ref <10)

## 2022-12-09 LAB — TSH: TSH: 0.031 u[IU]/mL — ABNORMAL LOW (ref 0.350–4.500)

## 2022-12-09 LAB — COMPREHENSIVE METABOLIC PANEL
ALT: 19 U/L (ref 0–44)
AST: 25 U/L (ref 15–41)
Albumin: 3.5 g/dL (ref 3.5–5.0)
Alkaline Phosphatase: 92 U/L (ref 38–126)
Anion gap: 7 (ref 5–15)
BUN: 48 mg/dL — ABNORMAL HIGH (ref 8–23)
CO2: 23 mmol/L (ref 22–32)
Calcium: 9.2 mg/dL (ref 8.9–10.3)
Chloride: 110 mmol/L (ref 98–111)
Creatinine, Ser: 1.32 mg/dL — ABNORMAL HIGH (ref 0.44–1.00)
GFR, Estimated: 42 mL/min — ABNORMAL LOW (ref >60)
Glucose, Bld: 118 mg/dL — ABNORMAL HIGH (ref 70–99)
Potassium: 3.4 mmol/L — ABNORMAL LOW (ref 3.5–5.1)
Sodium: 140 mmol/L (ref 135–145)
Total Bilirubin: 1 mg/dL (ref <1.2)
Total Protein: 6.8 g/dL (ref 6.5–8.1)

## 2022-12-09 LAB — LIPASE, BLOOD: Lipase: 26 U/L (ref 11–51)

## 2022-12-09 LAB — I-STAT CG4 LACTIC ACID, ED: Lactic Acid, Venous: 1.2 mmol/L (ref 0.5–1.9)

## 2022-12-09 LAB — T4, FREE: Free T4: 1.55 ng/dL — ABNORMAL HIGH (ref 0.61–1.12)

## 2022-12-09 LAB — TROPONIN I (HIGH SENSITIVITY)
Troponin I (High Sensitivity): 9 ng/L (ref <18)
Troponin I (High Sensitivity): 7 ng/L (ref <18)

## 2022-12-09 MED ORDER — LORAZEPAM 1 MG PO TABS: 0.0000 mg | ORAL_TABLET | Freq: Four times a day (QID) | ORAL | Status: DC

## 2022-12-09 MED ORDER — SENNOSIDES-DOCUSATE SODIUM 8.6-50 MG PO TABS: 1.0000 | ORAL_TABLET | Freq: Every evening | ORAL | Status: DC | PRN

## 2022-12-09 MED ORDER — ONDANSETRON HCL 4 MG/2ML IJ SOLN: 4.0000 mg | Freq: Four times a day (QID) | INTRAMUSCULAR | Status: DC | PRN

## 2022-12-09 MED ORDER — FOLIC ACID 1 MG PO TABS
1.0000 mg | ORAL_TABLET | Freq: Every day | ORAL | Status: DC
  Filled 2022-12-09: qty 1

## 2022-12-09 MED ORDER — THIAMINE HCL 100 MG/ML IJ SOLN
100.0000 mg | Freq: Every day | INTRAMUSCULAR | Status: DC
  Administered 2022-12-10: 100 mg via INTRAVENOUS
  Filled 2022-12-09: qty 2

## 2022-12-09 MED ORDER — ADULT MULTIVITAMIN W/MINERALS CH
1.0000 | ORAL_TABLET | Freq: Every day | ORAL | Status: DC
  Filled 2022-12-09: qty 1

## 2022-12-09 MED ORDER — ONDANSETRON HCL 4 MG/2ML IJ SOLN
4.0000 mg | Freq: Once | INTRAMUSCULAR | Status: AC
  Administered 2022-12-09: 4 mg via INTRAVENOUS
  Filled 2022-12-09: qty 2

## 2022-12-09 MED ORDER — ATORVASTATIN CALCIUM 10 MG PO TABS
20.0000 mg | ORAL_TABLET | Freq: Every day | ORAL | Status: DC
  Filled 2022-12-09: qty 2

## 2022-12-09 MED ORDER — LORAZEPAM 2 MG/ML IJ SOLN: 0.0000 mg | Freq: Two times a day (BID) | INTRAMUSCULAR | Status: DC

## 2022-12-09 MED ORDER — CLONIDINE HCL 0.1 MG PO TABS
0.1000 mg | ORAL_TABLET | Freq: Two times a day (BID) | ORAL | Status: DC
  Administered 2022-12-09: 0.1 mg via ORAL
  Filled 2022-12-09 (×2): qty 1

## 2022-12-09 MED ORDER — AMLODIPINE BESYLATE 5 MG PO TABS
2.5000 mg | ORAL_TABLET | Freq: Every day | ORAL | Status: DC
  Filled 2022-12-09: qty 1

## 2022-12-09 MED ORDER — ONDANSETRON HCL 4 MG PO TABS: 4.0000 mg | ORAL_TABLET | Freq: Four times a day (QID) | ORAL | Status: DC | PRN

## 2022-12-09 MED ORDER — LORAZEPAM 1 MG PO TABS: 0.0000 mg | ORAL_TABLET | Freq: Two times a day (BID) | ORAL | Status: DC

## 2022-12-09 MED ORDER — ACETAMINOPHEN 650 MG RE SUPP: 650.0000 mg | Freq: Four times a day (QID) | RECTAL | Status: DC | PRN

## 2022-12-09 MED ORDER — LORAZEPAM 1 MG PO TABS
0.0000 mg | ORAL_TABLET | Freq: Four times a day (QID) | ORAL | Status: DC
Start: 2022-12-09 — End: 2022-12-11
  Administered 2022-12-09 – 2022-12-10 (×2): 2 mg via ORAL
  Administered 2022-12-10: 1 mg via ORAL
  Filled 2022-12-09: qty 1
  Filled 2022-12-09 (×2): qty 2

## 2022-12-09 MED ORDER — THIAMINE MONONITRATE 100 MG PO TABS
100.0000 mg | ORAL_TABLET | Freq: Every day | ORAL | Status: DC
  Filled 2022-12-09: qty 1

## 2022-12-09 MED ORDER — LEVOTHYROXINE SODIUM 137 MCG PO TABS: 137.0000 ug | ORAL_TABLET | Freq: Every day | ORAL | Status: DC

## 2022-12-09 MED ORDER — ENOXAPARIN SODIUM 30 MG/0.3ML IJ SOSY
30.0000 mg | PREFILLED_SYRINGE | INTRAMUSCULAR | Status: DC
  Administered 2022-12-09 – 2022-12-12 (×4): 30 mg via SUBCUTANEOUS
  Filled 2022-12-09 (×4): qty 0.3

## 2022-12-09 MED ORDER — LORAZEPAM 1 MG PO TABS: 1.0000 mg | ORAL_TABLET | ORAL | Status: DC | PRN

## 2022-12-09 MED ORDER — LORAZEPAM 2 MG/ML IJ SOLN: 0.0000 mg | Freq: Four times a day (QID) | INTRAMUSCULAR | Status: DC

## 2022-12-09 MED ORDER — LORAZEPAM 2 MG/ML IJ SOLN: 1.0000 mg | INTRAMUSCULAR | Status: DC | PRN

## 2022-12-09 MED ORDER — ACETAMINOPHEN 325 MG PO TABS
650.0000 mg | ORAL_TABLET | Freq: Four times a day (QID) | ORAL | Status: DC | PRN
  Administered 2022-12-13 – 2022-12-16 (×3): 650 mg via ORAL
  Filled 2022-12-09 (×4): qty 2

## 2022-12-09 MED ORDER — POTASSIUM CHLORIDE CRYS ER 20 MEQ PO TBCR
20.0000 meq | EXTENDED_RELEASE_TABLET | Freq: Once | ORAL | Status: AC
  Administered 2022-12-09: 20 meq via ORAL
  Filled 2022-12-09: qty 1

## 2022-12-09 NOTE — ED Notes (Signed)
ED TO INPATIENT HANDOFF REPORT  Name/Age/Gender Melissa Wolfe 72 y.o. female  Code Status    Code Status Orders  (From admission, onward)           Start     Ordered   12/09/22 2001  Full code  Continuous       Question:  By:  Answer:  Consent: discussion documented in EHR   12/09/22 2001           Code Status History     Date Active Date Inactive Code Status Order ID Comments User Context   06/22/2014 2323 06/28/2014 1921 Full Code [id number]  Kerry Hough PA-C Inpatient       Home/SNF/Other Home  Chief Complaint Acute encephalopathy [G93.40]  Level of Care/Admitting Diagnosis ED Disposition     ED Disposition  Admit   Condition  --   Comment  Hospital Area: [facility] [[id number]]  Level of Care: Med-Surg [16]  May place patient in observation at [facility] or [facility] if equivalent level of care is available:: Yes  Covid Evaluation: Asymptomatic - no recent exposure (last 10 days) testing not required  Diagnosis: Acute encephalopathy [191478]  Admitting Physician: Briscoe Deutscher [[id number]]  Attending Physician: Briscoe Deutscher [[id number]]          Medical History Past Medical History:  Diagnosis Date   Alcoholism (HCC)    Allergy    Depression    Hyperlipemia    Hypertension    Hypothyroid     Allergies Allergies  Allergen Reactions   Cephalexin Rash   Codeine Anxiety and Other (See Comments)    Hallucinations and jittery  Hallucinations and jittery  Pt states "anxiety and out of body experience"    IV Location/Drains/Wounds Patient Lines/Drains/Airways Status     Active Line/Drains/Airways     Name Placement date Placement time Site Days   Peripheral IV 12/09/22 18 G Anterior;Left Antecubital 12/09/22  1353  Antecubital  less than 1            Labs/Imaging Results for orders placed or performed during the hospital encounter of 12/09/22 (from the past 48 hour(s))  Comprehensive metabolic  panel     Status: Abnormal   Collection Time: 12/09/22  1:47 PM  Result Value Ref Range   Sodium 140 135 - 145 mmol/L   Potassium 3.4 (L) 3.5 - 5.1 mmol/L   Chloride 110 98 - 111 mmol/L   CO2 23 22 - 32 mmol/L   Glucose, Bld 118 (H) 70 - 99 mg/dL    Comment: Glucose reference range applies only to samples taken after fasting for at least 8 hours.   BUN 48 (H) 8 - 23 mg/dL   Creatinine, Ser 6.96 (H) 0.44 - 1.00 mg/dL   Calcium 9.2 8.9 - 29.5 mg/dL   Total Protein 6.8 6.5 - 8.1 g/dL   Albumin 3.5 3.5 - 5.0 g/dL   AST 25 15 - 41 U/L   ALT 19 0 - 44 U/L   Alkaline Phosphatase 92 38 - 126 U/L   Total Bilirubin 1.0 <1.2 mg/dL   GFR, Estimated 42 (L) >60 mL/min    Comment: (NOTE) Calculated using the CKD-EPI Creatinine Equation (2021)    Anion gap 7 5 - 15    Comment: Performed at [facility], [address] [place], [place] [address]  Ethanol     Status: None   Collection Time: 12/09/22  1:47 PM  Result Value Ref  Range   Alcohol, Ethyl (B) <10 <10 mg/dL    Comment: (NOTE) Lowest detectable limit for serum alcohol is 10 mg/dL.  For medical purposes only. Performed at [facility], [address], [place], [place] [address]   Lipase, blood     Status: None   Collection Time: 12/09/22  1:47 PM  Result Value Ref Range   Lipase 26 11 - 51 U/L    Comment: Performed at [facility], [address] [place], [place] [address]  Troponin I (High Sensitivity)     Status: None   Collection Time: 12/09/22  1:47 PM  Result Value Ref Range   Troponin I (High Sensitivity) 9 <18 ng/L    Comment: (NOTE) Elevated high sensitivity troponin I (hsTnI) values and significant  changes across serial measurements may suggest ACS but many other  chronic and acute conditions are known to elevate hsTnI results.  Refer to the "Links" section for chest pain algorithms and additional  guidance. Performed at [facility], [address] [place], [place] [address]   CBC with Differential     Status: Abnormal   Collection Time: 12/09/22  1:47 PM  Result Value Ref Range   WBC 8.0 4.0 - 10.5 K/uL   RBC 4.30 3.87 - 5.11 MIL/uL   Hemoglobin 13.5 12.0 - 15.0 g/dL   HCT 46.9 62.9 - 52.8 %   MCV 93.5 80.0 - 100.0 fL   MCH 31.4 26.0 - 34.0 pg   MCHC 33.6 30.0 - 36.0 g/dL   RDW 41.3 24.4 - 01.0 %   Platelets 235 150 - 400 K/uL   nRBC 0.0 0.0 - 0.2 %   Neutrophils Relative % 64 %   Neutro Abs 5.1 1.7 - 7.7 K/uL   Lymphocytes Relative 18 %   Lymphs Abs 1.4 0.7 - 4.0 K/uL   Monocytes Relative 12 %   Monocytes Absolute 1.0 0.1 - 1.0 K/uL   Eosinophils Relative 4 %   Eosinophils Absolute 0.3 0.0 - 0.5 K/uL   Basophils Relative 1 %   Basophils Absolute 0.1 0.0 - 0.1 K/uL   Immature Granulocytes 1 %   Abs Immature Granulocytes 0.08 (H) 0.00 - 0.07 K/uL    Comment: Performed at [facility], [address] [place], [place] [address]  Blood culture (routine x 2)     Status: None (Preliminary result)   Collection Time: 12/09/22  2:05 PM   Specimen: BLOOD RIGHT FOREARM  Result Value Ref Range   Specimen Description      BLOOD RIGHT FOREARM Performed at [facility], [address], [place], [place] [address]    Special Requests      BOTTLES DRAWN AEROBIC AND ANAEROBIC Blood Culture results may not be optimal due to an inadequate volume of blood received in culture bottles Performed at [facility], [address] [place], [place] [address]    Culture PENDING    Report Status PENDING   I-Stat Lactic Acid     Status: None   Collection Time: 12/09/22  2:10 PM  Result Value Ref Range   Lactic Acid, Venous 1.2 0.5 - 1.9 mmol/L  Troponin I (High Sensitivity)     Status: None   Collection Time: 12/09/22  4:46 PM  Result Value Ref Range   Troponin I (High Sensitivity) 7 <18 ng/L    Comment: (NOTE) Elevated high sensitivity troponin I (hsTnI) values and significant   changes across  serial measurements may suggest ACS but many other  chronic and acute conditions are known to elevate hsTnI results.  Refer to the "Links" section for chest pain algorithms and additional  guidance. Performed at [facility], [address] [place], [place] [address]   TSH     Status: Abnormal   Collection Time: 12/09/22  4:46 PM  Result Value Ref Range   TSH 0.031 (L) 0.350 - 4.500 uIU/mL    Comment: Performed by a 3rd Generation assay with a functional sensitivity of <=0.01 uIU/mL. Performed at [facility], [address] [place], [place] [address]   Urinalysis, w/ Reflex to Culture (Infection Suspected) -Urine, Clean Catch     Status: Abnormal   Collection Time: 12/09/22  6:35 PM  Result Value Ref Range   Specimen Source URINE, CLEAN CATCH    Color, Urine YELLOW YELLOW   APPearance HAZY (A) CLEAR   Specific Gravity, Urine 1.020 1.005 - 1.030   pH 5.0 5.0 - 8.0   Glucose, UA NEGATIVE NEGATIVE mg/dL   Hgb urine dipstick NEGATIVE NEGATIVE   Bilirubin Urine NEGATIVE NEGATIVE   Ketones, ur 5 (A) NEGATIVE mg/dL   Protein, ur NEGATIVE NEGATIVE mg/dL   Nitrite NEGATIVE NEGATIVE   Leukocytes,Ua SMALL (A) NEGATIVE   RBC / HPF 0-5 0 - 5 RBC/hpf   WBC, UA 6-10 0 - 5 WBC/hpf    Comment:        Reflex urine culture not performed if WBC <=10, OR if Squamous epithelial cells >5. If Squamous epithelial cells >5 suggest recollection.    Bacteria, UA RARE (A) NONE SEEN   Squamous Epithelial / HPF 6-10 0 - 5 /HPF    Comment: Performed at [facility], [address], [place], [place] [address]  Rapid urine drug screen (hospital performed)     Status: Abnormal   Collection Time: 12/09/22  6:35 PM  Result Value Ref Range   Opiates NONE DETECTED NONE DETECTED   Cocaine NONE DETECTED NONE DETECTED   Benzodiazepines POSITIVE (A) NONE DETECTED   Amphetamines NONE DETECTED NONE DETECTED   Tetrahydrocannabinol POSITIVE  (A) NONE DETECTED   Barbiturates NONE DETECTED NONE DETECTED    Comment: (NOTE) DRUG SCREEN FOR MEDICAL PURPOSES ONLY.  IF CONFIRMATION IS NEEDED FOR ANY PURPOSE, NOTIFY LAB WITHIN 5 DAYS.  LOWEST DETECTABLE LIMITS FOR URINE DRUG SCREEN Drug Class                     Cutoff (ng/mL) Amphetamine and metabolites    1000 Barbiturate and metabolites    200 Benzodiazepine                 200 Opiates and metabolites        300 Cocaine and metabolites        300 THC                            50 Performed at [facility], [address] [place], [place] [address]    CT Head Wo Contrast  Result Date: 12/09/2022 CLINICAL DATA:  Get altered mental status. EXAM: CT HEAD WITHOUT CONTRAST TECHNIQUE: Contiguous axial images were obtained from the base of the skull through the vertex without intravenous contrast. RADIATION DOSE REDUCTION: This exam was performed according to the departmental dose-optimization program which includes automated exposure control, adjustment of the mA and/or kV according to patient size and/or use  of iterative reconstruction technique. COMPARISON:  05/10/2022. FINDINGS: Brain: No evidence of acute infarction, hemorrhage, hydrocephalus, extra-axial collection or mass lesion/mass effect. Bilateral white matter hypoattenuation consistent with moderate chronic microvascular ischemic change, stable. Vascular: No hyperdense vessel or unexpected calcification. Skull: Normal. Negative for fracture or focal lesion. Sinuses/Orbits: Globes and orbits are unremarkable. Visualized sinuses are clear. Other: None. IMPRESSION: 1. No acute intracranial abnormalities. 2. Chronic microvascular ischemic changes in the white matter. Electronically Signed   By: Amie Portland M.D.   On: 12/09/2022 15:59   DG Chest 2 View  Result Date: 12/09/2022 CLINICAL DATA:  Altered mental status. Increased disorientation for 4 days. EXAM: CHEST - 2 VIEW COMPARISON:  Radiographs 03/01/2014.  FINDINGS: The heart size and mediastinal contours are stable. There are calcifications within the aortic arch. The lungs are clear. There is no pleural effusion or pneumothorax. No acute osseous findings are identified. Mild degenerative changes in the spine. Telemetry leads overlie the chest. IMPRESSION: No evidence of acute cardiopulmonary process. Aortic atherosclerosis. Electronically Signed   By: Carey Bullocks M.D.   On: 12/09/2022 14:33    Pending Labs Unresulted Labs (From admission, onward)     Start     Ordered   12/16/22 0500  Creatinine, serum  (enoxaparin (LOVENOX)    CrCl >/= 30 ml/min)  Weekly,   R     Comments: while on enoxaparin therapy    12/09/22 2001   12/10/22 0500  Basic metabolic panel  Daily,   R      12/09/22 2001   12/10/22 0500  CBC  Daily,   R      12/09/22 2001   12/09/22 2002  Vitamin B12  Once,   R        12/09/22 2001   12/09/22 2002  Vitamin B1  Once,   R        12/09/22 2001   12/09/22 2002  RPR  Once,   R        12/09/22 2001   12/09/22 2001  Magnesium  Once,   R        12/09/22 2001   12/09/22 2001  Phosphorus  Once,   R        12/09/22 2001   12/09/22 1955  Ammonia  Once,   R        12/09/22 1954   12/09/22 1831  T4, free  Once,   URGENT        12/09/22 1830   12/09/22 1831  T3, free  Once,   URGENT        12/09/22 1830   12/09/22 1357  Blood culture (routine x 2)  BLOOD CULTURE X 2,   R (with STAT occurrences)      12/09/22 1356            Vitals/Pain Today's Vitals   12/09/22 1500 12/09/22 1810 12/09/22 1815 12/09/22 2044  BP: 129/87 (!) 142/81 136/87 (!) 149/75  Pulse: 84 81 88   Resp: 20 17 16    Temp:  99 F (37.2 C)    TempSrc:  Oral    SpO2: 99% 99% 100%     Isolation Precautions No active isolations  Medications Medications  thiamine (VITAMIN B1) tablet 100 mg (100 mg Oral Patient Refused/Not Given 12/09/22 1620)    Or  thiamine (VITAMIN B1) injection 100 mg ( Intravenous See Alternative 12/09/22 1620)  amLODipine  (NORVASC) tablet 2.5 mg (has no administration in time range)  atorvastatin (LIPITOR) tablet 20  mg (has no administration in time range)  cloNIDine (CATAPRES) tablet 0.1 mg (has no administration in time range)  enoxaparin (LOVENOX) injection 30 mg (has no administration in time range)  acetaminophen (TYLENOL) tablet 650 mg (has no administration in time range)    Or  acetaminophen (TYLENOL) suppository 650 mg (has no administration in time range)  senna-docusate (Senokot-S) tablet 1 tablet (has no administration in time range)  ondansetron (ZOFRAN) tablet 4 mg (has no administration in time range)    Or  ondansetron (ZOFRAN) injection 4 mg (has no administration in time range)  LORazepam (ATIVAN) tablet 1-4 mg (has no administration in time range)    Or  LORazepam (ATIVAN) injection 1-4 mg (has no administration in time range)  folic acid (FOLVITE) tablet 1 mg (has no administration in time range)  multivitamin with minerals tablet 1 tablet (has no administration in time range)  LORazepam (ATIVAN) tablet 0-4 mg (2 mg Oral Given 12/09/22 2049)    Followed by  LORazepam (ATIVAN) tablet 0-4 mg (has no administration in time range)  ondansetron (ZOFRAN) injection 4 mg (4 mg Intravenous Given 12/09/22 1411)  potassium chloride SA (KLOR-CON M) CR tablet 20 mEq (20 mEq Oral Given 12/09/22 2049)    Mobility walks

## 2022-12-09 NOTE — H&P (Signed)
History and Physical    GAY BYWATER ZOX:[id number] DOB: [birth year] DOA: 12/09/2022  PCP: Creola Corn, MD   Patient coming from: Home   Chief Complaint: AMS   HPI: Melissa Wolfe is a 72 y.o. female with medical history significant for hypertension, hyperlipidemia, hypothyroidism, depression, and alcoholism who presents for evaluation of altered mental status.  Patient was seen in the emergency department on the night of 12/04/22 with increasing chronic left hip pain.  She was prescribed prednisone.  Family is concerned that she has had worsening confusion and irritability since then.  Family also expresses concern with patient's increased recent alcohol use.   Patient denies any recent alcohol use and states that her brother is making up stories about her.  She denies chest pain, cough, shortness of breath, fevers, or dysuria.  She reports mild generalized abdominal discomfort and nausea without vomiting.  ED Course: Upon arrival to the ED, patient is found to be afebrile and saturating well on room air with normal heart rate and stable blood pressure.  Labs are most notable for BUN 48, creatinine 1.32, TSH 0.031, normal WBC, and normal lactic acid.  Blood cultures were collected in the ED and the patient was given a dose of Zofran.  Review of Systems:  ROS limited by patient's clinical condition.  Past Medical History:  Diagnosis Date   Alcoholism (HCC)    Allergy    Depression    Hyperlipemia    Hypertension    Hypothyroid     Past Surgical History:  Procedure Laterality Date   BREAST EXCISIONAL BIOPSY Left 2008   benign   COLONOSCOPY     fallopian ablation     HAND SURGERY Left 05/2015   MYOMECTOMY     x2   OOPHORECTOMY Right    POLYPECTOMY      Social History:   reports that she has been smoking cigarettes. She has never used smokeless tobacco. She reports that she does not drink alcohol and does not use drugs.  Allergies  Allergen Reactions   Codeine  Other (See Comments)    Hallucinations and jittery   Cephalexin Rash    Family History  Problem Relation Age of Onset   Colon cancer Neg Hx    Colon polyps Neg Hx    Esophageal cancer Neg Hx    Rectal cancer Neg Hx    Stomach cancer Neg Hx      Prior to Admission medications   Medication Sig Start Date End Date Taking? Authorizing Provider  amLODipine (NORVASC) 2.5 MG tablet Take 2.5 mg by mouth daily. 08/07/21   [provider]  Ascorbic Acid (VITAMIN C PO) Take by mouth.    [provider]  atorvastatin (LIPITOR) 20 MG tablet Take 1 tablet (20 mg total) by mouth daily. For high cholesterol 06/28/14   Armandina Stammer I, NP  cloNIDine (CATAPRES) 0.1 MG tablet Take 1 tablet (0.1 mg total) by mouth 2 (two) times daily. For high blood pressure 06/29/14   Nwoko, Nicole Kindred I, NP  ELDERBERRY PO Take by mouth.    [provider]  fluticasone (FLONASE) 50 MCG/ACT nasal spray Place 1 spray into both nostrils daily.    [provider]  gabapentin (NEURONTIN) 300 MG capsule Take 300 mg by mouth 3 (three) times daily. 09/15/21   [provider]  hydrochlorothiazide (HYDRODIURIL) 25 MG tablet Take 25 mg by mouth as needed. Patient not taking: Reported on 08/23/2021    [provider]  levothyroxine (SYNTHROID,  LEVOTHROID) 137 MCG tablet Take 1 tablet (137 mcg total) by mouth daily before breakfast. For low thyroid function 06/28/14   Armandina Stammer I, NP  lidocaine (LIDODERM) 5 % Place 1 patch onto the skin daily. Remove & Discard patch within 12 hours or as directed by MD 12/05/22   Nicanor Alcon, April, MD  LORazepam (ATIVAN) 1 MG tablet  06/05/15   [provider]  losartan (COZAAR) 100 MG tablet Take by mouth. 07/03/21   [provider]  methylPREDNISolone (MEDROL DOSEPAK) 4 MG TBPK tablet Take as instructed by dose packaging 11/04/22   Anders Simmonds T, DO  Multiple Vitamin (MULTIVITAMIN) tablet Take 1 tablet by mouth daily. Patient not taking:  Reported on 09/28/2021    [provider]  Multiple Vitamins-Minerals (ZINC PO) Take by mouth.    [provider]  naproxen (NAPROSYN) 500 MG tablet     [provider]  Nutritional Supplements (JUICE PLUS FIBRE PO) Juice Plus    [provider]  Omega-3 Fatty Acids (OMEGA 3 PO) Take 1,000 mg by mouth daily.    [provider]  OVER THE COUNTER MEDICATION Take 1 tablet by mouth daily. Sea moss    [provider]  oxyCODONE-acetaminophen (PERCOCET/ROXICET) 5-325 MG tablet Take 1 tablet by mouth every 6 (six) hours as needed for severe pain (pain score 7-10). 11/04/22   Arletha Pili, DO  predniSONE (DELTASONE) 20 MG tablet 3 tabs po day one, then 2 po daily x 4 days 12/05/22   Nicanor Alcon, April, MD    Physical Exam: Vitals:   12/09/22 1330 12/09/22 1500 12/09/22 1810 12/09/22 1815  BP: (!) 176/79 129/87 (!) 142/81 136/87  Pulse: 94 84 81 88  Resp: 16 20 17 16   Temp: 98.1 F (36.7 C)  99 F (37.2 C)   TempSrc: Oral  Oral   SpO2: 97% 99% 99% 100%     Constitutional: NAD, calm  Eyes: PERTLA, lids and conjunctivae normal ENMT: Mucous membranes are moist. Posterior pharynx clear of any exudate or lesions.   Neck: supple, no masses  Respiratory: no wheezing, no crackles. No accessory muscle use.  Cardiovascular: S1 & S2 heard, regular rate and rhythm. No extremity edema.   Abdomen: No distension, no tenderness, soft. Bowel sounds active.  Musculoskeletal: no clubbing / cyanosis. No joint deformity upper and lower extremities.   Skin: no significant rashes, lesions, ulcers. Warm, dry, well-perfused. Neurologic: CN 2-12 grossly intact. Sensation intact, DTR normal. Strength 5/5 in all 4 limbs. Awake and oriented to person only.  Psychiatric: Calm. Cooperative.    Labs and Imaging on Admission: I have personally reviewed following labs and imaging studies  CBC: Recent Labs  Lab 12/04/22 1348 12/09/22 1347  WBC 5.1 8.0  NEUTROABS  2.9 5.1  HGB 13.5 13.5  HCT 41.0 40.2  MCV 93.6 93.5  PLT 278 235   Basic Metabolic Panel: Recent Labs  Lab 12/04/22 1348 12/09/22 1347  NA 142 140  K 4.2 3.4*  CL 110 110  CO2 21* 23  GLUCOSE 113* 118*  BUN 32* 48*  CREATININE 1.16* 1.32*  CALCIUM 9.6 9.2   GFR: Estimated Creatinine Clearance: 28.7 mL/min (A) (by C-G formula based on SCr of 1.32 mg/dL (H)). Liver Function Tests: Recent Labs  Lab 12/09/22 1347  AST 25  ALT 19  ALKPHOS 92  BILITOT 1.0  PROT 6.8  ALBUMIN 3.5   Recent Labs  Lab 12/09/22 1347  LIPASE 26   No results for input(s): "AMMONIA"  in the last 168 hours. Coagulation Profile: No results for input(s): "INR", "PROTIME" in the last 168 hours. Cardiac Enzymes: No results for input(s): "CKTOTAL", "CKMB", "CKMBINDEX", "TROPONINI" in the last 168 hours. BNP (last 3 results) No results for input(s): "PROBNP" in the last 8760 hours. HbA1C: No results for input(s): "HGBA1C" in the last 72 hours. CBG: No results for input(s): "GLUCAP" in the last 168 hours. Lipid Profile: No results for input(s): "CHOL", "HDL", "LDLCALC", "TRIG", "CHOLHDL", "LDLDIRECT" in the last 72 hours. Thyroid Function Tests: Recent Labs    12/09/22 1646  TSH 0.031*   Anemia Panel: No results for input(s): "VITAMINB12", "FOLATE", "FERRITIN", "TIBC", "IRON", "RETICCTPCT" in the last 72 hours. Urine analysis:    Component Value Date/Time   COLORURINE YELLOW 12/09/2022 1835   APPEARANCEUR HAZY (A) 12/09/2022 1835   LABSPEC 1.020 12/09/2022 1835   PHURINE 5.0 12/09/2022 1835   GLUCOSEU NEGATIVE 12/09/2022 1835   HGBUR NEGATIVE 12/09/2022 1835   BILIRUBINUR NEGATIVE 12/09/2022 1835   KETONESUR 5 (A) 12/09/2022 1835   PROTEINUR NEGATIVE 12/09/2022 1835   NITRITE NEGATIVE 12/09/2022 1835   LEUKOCYTESUR SMALL (A) 12/09/2022 1835   Sepsis Labs: @LABRCNTIP (procalcitonin:4,lacticidven:4) ) Recent Results (from the past 240 hour(s))  Blood culture (routine x 2)      Status: None (Preliminary result)   Collection Time: 12/09/22  2:05 PM   Specimen: BLOOD RIGHT FOREARM  Result Value Ref Range Status   Specimen Description   Final    BLOOD RIGHT FOREARM Performed at [facility], [address], [place], [place] [address]    Special Requests   Final    BOTTLES DRAWN AEROBIC AND ANAEROBIC Blood Culture results may not be optimal due to an inadequate volume of blood received in culture bottles Performed at [facility], [address], [place], [place] [address]    Culture PENDING  Incomplete   Report Status PENDING  Incomplete     Radiological Exams on Admission: CT Head Wo Contrast  Result Date: 12/09/2022 CLINICAL DATA:  Get altered mental status. EXAM: CT HEAD WITHOUT CONTRAST TECHNIQUE: Contiguous axial images were obtained from the base of the skull through the vertex without intravenous contrast. RADIATION DOSE REDUCTION: This exam was performed according to the departmental dose-optimization program which includes automated exposure control, adjustment of the mA and/or kV according to patient size and/or use of iterative reconstruction technique. COMPARISON:  05/10/2022. FINDINGS: Brain: No evidence of acute infarction, hemorrhage, hydrocephalus, extra-axial collection or mass lesion/mass effect. Bilateral white matter hypoattenuation consistent with moderate chronic microvascular ischemic change, stable. Vascular: No hyperdense vessel or unexpected calcification. Skull: Normal. Negative for fracture or focal lesion. Sinuses/Orbits: Globes and orbits are unremarkable. Visualized sinuses are clear. Other: None. IMPRESSION: 1. No acute intracranial abnormalities. 2. Chronic microvascular ischemic changes in the white matter. Electronically Signed   By: Amie Portland M.D.   On: 12/09/2022 15:59   DG Chest 2 View  Result Date: 12/09/2022 CLINICAL DATA:  Altered mental status. Increased disorientation for 4 days. EXAM: CHEST - 2  VIEW COMPARISON:  Radiographs 03/01/2014. FINDINGS: The heart size and mediastinal contours are stable. There are calcifications within the aortic arch. The lungs are clear. There is no pleural effusion or pneumothorax. No acute osseous findings are identified. Mild degenerative changes in the spine. Telemetry leads overlie the chest. IMPRESSION: No evidence of acute cardiopulmonary process. Aortic atherosclerosis. Electronically Signed   By: Carey Bullocks M.D.   On: 12/09/2022 14:33     Assessment/Plan  1. Acute encephalopathy  - Hold prednisone and sedating medications, hold Synthroid and check free T4, check ammonia, thiamine, B12, and RPR, use delirium precautions    2. Alcoholism  - Monitor with CIWA, use Ativan as needed, supplement vitamins    3. Hypothyroidism  - TSH low in ED  - Hold Synthroid, check free T4   4. Hypertension  - Continue Norvasc and clonidine    5. CKD 3A  - Unclear baseline, suspect CKD 3A  - Hold losartan and hydrochlorothiazide initially given increase in creatinine from a few days ago, renally-dose medications    6. Hypokalemia  - Replacing     DVT prophylaxis: Lovenox  Code Status: Full  Level of Care: Level of care: Med-Surg Family Communication: Unable to reach the listed contact   Disposition Plan:  Patient is from: home  Anticipated d/c is to: TBD Anticipated d/c date is: 12/9 or 12/11/22  Patient currently: Pending improved mental status  Consults called: None  Admission status: Observation     Briscoe Deutscher, MD [facility]  12/09/2022, 8:01 PM

## 2022-12-09 NOTE — ED Triage Notes (Signed)
Pt BIB EMS from home r/t increased disorientation x 4 days. Per family, heavy alcohol use since death of child. Pt has been recently treated for UTI. She has increased confusion and irritability, decreased mobility. Has not taken any antibiotics.   BP 134/82 P 90 RR 28-30 SpO2 98% CBG 141 18 LAC

## 2022-12-09 NOTE — ED Notes (Signed)
Patient states she can only use the bedpan she can not walk so we used the bedpan to obtain urine sample.

## 2022-12-09 NOTE — ED Provider Notes (Signed)
  Physical Exam  BP (!) 142/81 (BP Location: Right Arm)   Pulse 81   Temp 99 F (37.2 C) (Oral)   Resp 17   SpO2 99%   Physical Exam  Procedures  Procedures  ED Course / MDM   Clinical Course as of 12/09/22 1826  Sun Dec 09, 2022  1533 Admit for metabolic encephalopathy currently unknown cause, pending labs  [JB]    Clinical Course User Index [JB] Sadira Standard, Horald Chestnut, PA-C   Medical Decision Making Amount and/or Complexity of Data Reviewed Labs: ordered. Radiology: ordered.  Risk OTC drugs. Prescription drug management. Decision regarding hospitalization.   Patient was received to sign off from [facility].  In short patient is presenting to the emergency room of 4 days of disorientation and concern for urinary tract infection causing altered mental status.  Patient slow to respond to questions alert to person and year.  Able to follow basic commands.  Pupils are equal and reactive.  Patient without focal neurological deficits on exam.

## 2022-12-09 NOTE — ED Provider Notes (Signed)
[facility] EMERGENCY DEPARTMENT AT [facility] Provider Note   CSN: [id number] Arrival date & time: 12/09/22  1310     History  Chief Complaint  Patient presents with   UTI   Alcohol Problem    DAKIRA BABAYEV is a 72 y.o. female past medical history significant for alcohol dependence presents today for increased disorientation x 4 days.  Patient is also recently been treated for UTI.  Patient endorses diffuse abdominal pain, nausea and chills.  Patient denies vomiting, diarrhea, dysuria, shortness of breath, cough, or fever.  Patient is oriented to self but not to place, time, or situation.  Patient states that she has "been kidnapped "and that is why she is here.   Alcohol Problem Associated symptoms include abdominal pain.       Home Medications Prior to Admission medications   Medication Sig Start Date End Date Taking? Authorizing Provider  amLODipine (NORVASC) 2.5 MG tablet Take 2.5 mg by mouth daily. 08/07/21   [provider]  Ascorbic Acid (VITAMIN C PO) Take by mouth.    [provider]  atorvastatin (LIPITOR) 20 MG tablet Take 1 tablet (20 mg total) by mouth daily. For high cholesterol 06/28/14   Armandina Stammer I, NP  cloNIDine (CATAPRES) 0.1 MG tablet Take 1 tablet (0.1 mg total) by mouth 2 (two) times daily. For high blood pressure 06/29/14   Nwoko, Nicole Kindred I, NP  ELDERBERRY PO Take by mouth.    [provider]  fluticasone (FLONASE) 50 MCG/ACT nasal spray Place 1 spray into both nostrils daily.    [provider]  gabapentin (NEURONTIN) 300 MG capsule Take 300 mg by mouth 3 (three) times daily. 09/15/21   [provider]  hydrochlorothiazide (HYDRODIURIL) 25 MG tablet Take 25 mg by mouth as needed. Patient not taking: Reported on 08/23/2021    [provider]  levothyroxine (SYNTHROID, LEVOTHROID) 137 MCG tablet Take 1 tablet (137 mcg total) by mouth daily before breakfast. For low thyroid function 06/28/14    Armandina Stammer I, NP  lidocaine (LIDODERM) 5 % Place 1 patch onto the skin daily. Remove & Discard patch within 12 hours or as directed by MD 12/05/22   Nicanor Alcon, April, MD  LORazepam (ATIVAN) 1 MG tablet  06/05/15   [provider]  losartan (COZAAR) 100 MG tablet Take by mouth. 07/03/21   [provider]  methylPREDNISolone (MEDROL DOSEPAK) 4 MG TBPK tablet Take as instructed by dose packaging 11/04/22   Anders Simmonds T, DO  Multiple Vitamin (MULTIVITAMIN) tablet Take 1 tablet by mouth daily. Patient not taking: Reported on 09/28/2021    [provider]  Multiple Vitamins-Minerals (ZINC PO) Take by mouth.    [provider]  naproxen (NAPROSYN) 500 MG tablet     [provider]  Nutritional Supplements (JUICE PLUS FIBRE PO) Juice Plus    [provider]  Omega-3 Fatty Acids (OMEGA 3 PO) Take 1,000 mg by mouth daily.    [provider]  OVER THE COUNTER MEDICATION Take 1 tablet by mouth daily. Sea moss    [provider]  oxyCODONE-acetaminophen (PERCOCET/ROXICET) 5-325 MG tablet Take 1 tablet by mouth every 6 (six) hours as needed for severe pain (pain score 7-10). 11/04/22   Arletha Pili, DO  predniSONE (DELTASONE) 20 MG tablet 3 tabs po day one, then 2 po daily x 4 days 12/05/22   Nicanor Alcon, April, MD      Allergies    Codeine and Cephalexin  Review of Systems   Review of Systems  Gastrointestinal:  Positive for abdominal pain.  Psychiatric/Behavioral:  Positive for confusion.     Physical Exam Updated Vital Signs BP 129/87   Pulse 94   Temp 98.1 F (36.7 C) (Oral)   Resp 16   SpO2 97%  Physical Exam Vitals and nursing note reviewed.  Constitutional:      General: She is not in acute distress.    Appearance: She is well-developed.     Comments: Patient shivering upon exam.  HENT:     Head: Normocephalic and atraumatic.     Right Ear: External ear normal.     Left Ear: External ear normal.     Nose: Nose  normal.  Eyes:     Conjunctiva/sclera: Conjunctivae normal.     Pupils: Pupils are equal, round, and reactive to light.  Cardiovascular:     Rate and Rhythm: Normal rate and regular rhythm.     Heart sounds: No murmur heard. Pulmonary:     Effort: Pulmonary effort is normal. No respiratory distress.     Breath sounds: Normal breath sounds.  Abdominal:     General: Bowel sounds are normal.     Palpations: Abdomen is soft.     Tenderness: There is generalized abdominal tenderness.  Musculoskeletal:        General: No swelling.     Cervical back: Neck supple.     Right lower leg: No edema.     Left lower leg: No edema.  Skin:    General: Skin is warm and dry.     Capillary Refill: Capillary refill takes less than 2 seconds.     Findings: No rash.  Neurological:     Mental Status: She is alert. She is disoriented.  Psychiatric:        Mood and Affect: Mood normal.     ED Results / Procedures / Treatments   Labs (all labs ordered are listed, but only abnormal results are displayed) Labs Reviewed  COMPREHENSIVE METABOLIC PANEL - Abnormal; Notable for the following components:      Result Value   Potassium 3.4 (*)    Glucose, Bld 118 (*)    BUN 48 (*)    Creatinine, Ser 1.32 (*)    GFR, Estimated 42 (*)    All other components within normal limits  CBC WITH DIFFERENTIAL/PLATELET - Abnormal; Notable for the following components:   Abs Immature Granulocytes 0.08 (*)    All other components within normal limits  CULTURE, BLOOD (ROUTINE X 2)  CULTURE, BLOOD (ROUTINE X 2)  ETHANOL  LIPASE, BLOOD  URINALYSIS, W/ REFLEX TO CULTURE (INFECTION SUSPECTED)  RAPID URINE DRUG SCREEN, HOSP PERFORMED  TSH  I-STAT CG4 LACTIC ACID, ED  I-STAT CG4 LACTIC ACID, ED  TROPONIN I (HIGH SENSITIVITY)  TROPONIN I (HIGH SENSITIVITY)    EKG None  Radiology DG Chest 2 View  Result Date: 12/09/2022 CLINICAL DATA:  Altered mental status. Increased disorientation for 4 days. EXAM: CHEST - 2  VIEW COMPARISON:  Radiographs 03/01/2014. FINDINGS: The heart size and mediastinal contours are stable. There are calcifications within the aortic arch. The lungs are clear. There is no pleural effusion or pneumothorax. No acute osseous findings are identified. Mild degenerative changes in the spine. Telemetry leads overlie the chest. IMPRESSION: No evidence of acute cardiopulmonary process. Aortic atherosclerosis. Electronically Signed   By: Carey Bullocks M.D.   On: 12/09/2022 14:33    Procedures Procedures    Medications Ordered  in ED Medications  LORazepam (ATIVAN) injection 0-4 mg ( Intravenous Not Given 12/09/22 1520)    Or  LORazepam (ATIVAN) tablet 0-4 mg ( Oral See Alternative 12/09/22 1520)  LORazepam (ATIVAN) injection 0-4 mg (has no administration in time range)    Or  LORazepam (ATIVAN) tablet 0-4 mg (has no administration in time range)  thiamine (VITAMIN B1) tablet 100 mg (has no administration in time range)    Or  thiamine (VITAMIN B1) injection 100 mg (has no administration in time range)  ondansetron (ZOFRAN) injection 4 mg (4 mg Intravenous Given 12/09/22 1411)    ED Course/ Medical Decision Making/ A&P                                 Medical Decision Making Amount and/or Complexity of Data Reviewed Labs: ordered. Radiology: ordered.   This patient presents to the ED with chief complaint(s) of confusion and abdominal pain with pertinent past medical history of alcohol dependence which further complicates the presenting complaint. The complaint involves an extensive differential diagnosis and also carries with it a high risk of complications and morbidity.    The differential diagnosis includes alcohol withdrawal, UTI, encephalopathy, sepsis  ED Course and Reassessment:   Independent labs interpretation:  The following labs were independently interpreted:  CBC: No notable findings CMP: Mild hypokalemia, elevated bun, elevated creatinine at 1.32 Ethanol:  <10 Lipase: 26 UA: Troponin: 9 Blood culture: Pending Lactic acid: 1.2 EKG:   Independent visualization of imaging: - I independently visualized the following imaging with scope of interpretation limited to determining acute life threatening conditions related to emergency care: Chest x-ray, which revealed no evidence of acute cardiopulmonary process.  Patient signed out to Jasmine Pang, PA-C at shift change pending laboratory results and likely admission for encephalopathy.         Final Clinical Impression(s) / ED Diagnoses Final diagnoses:  None    Rx / DC Orders ED Discharge Orders     None         Dolphus Jenny, PA-C 12/09/22 1533    Long, Arlyss Repress, MD 12/11/22 1240

## 2022-12-10 DIAGNOSIS — Y92230 Patient room in hospital as the place of occurrence of the external cause: Secondary | ICD-10-CM | POA: Diagnosis not present

## 2022-12-10 DIAGNOSIS — W19XXXA Unspecified fall, initial encounter: Secondary | ICD-10-CM | POA: Diagnosis not present

## 2022-12-10 DIAGNOSIS — Z881 Allergy status to other antibiotic agents status: Secondary | ICD-10-CM | POA: Diagnosis not present

## 2022-12-10 DIAGNOSIS — M25552 Pain in left hip: Secondary | ICD-10-CM | POA: Diagnosis present

## 2022-12-10 DIAGNOSIS — Z87440 Personal history of urinary (tract) infections: Secondary | ICD-10-CM | POA: Diagnosis not present

## 2022-12-10 DIAGNOSIS — E7800 Pure hypercholesterolemia, unspecified: Secondary | ICD-10-CM | POA: Diagnosis present

## 2022-12-10 DIAGNOSIS — R7881 Bacteremia: Secondary | ICD-10-CM | POA: Diagnosis not present

## 2022-12-10 DIAGNOSIS — F17210 Nicotine dependence, cigarettes, uncomplicated: Secondary | ICD-10-CM | POA: Diagnosis present

## 2022-12-10 DIAGNOSIS — Z634 Disappearance and death of family member: Secondary | ICD-10-CM | POA: Diagnosis not present

## 2022-12-10 DIAGNOSIS — Z79899 Other long term (current) drug therapy: Secondary | ICD-10-CM | POA: Diagnosis not present

## 2022-12-10 DIAGNOSIS — R944 Abnormal results of kidney function studies: Secondary | ICD-10-CM | POA: Diagnosis present

## 2022-12-10 DIAGNOSIS — F1020 Alcohol dependence, uncomplicated: Secondary | ICD-10-CM | POA: Diagnosis present

## 2022-12-10 DIAGNOSIS — T426X5A Adverse effect of other antiepileptic and sedative-hypnotic drugs, initial encounter: Secondary | ICD-10-CM | POA: Diagnosis present

## 2022-12-10 DIAGNOSIS — F331 Major depressive disorder, recurrent, moderate: Secondary | ICD-10-CM | POA: Diagnosis present

## 2022-12-10 DIAGNOSIS — N1831 Chronic kidney disease, stage 3a: Secondary | ICD-10-CM | POA: Diagnosis present

## 2022-12-10 DIAGNOSIS — W1830XA Fall on same level, unspecified, initial encounter: Secondary | ICD-10-CM | POA: Diagnosis not present

## 2022-12-10 DIAGNOSIS — G9340 Encephalopathy, unspecified: Secondary | ICD-10-CM | POA: Diagnosis not present

## 2022-12-10 DIAGNOSIS — N3000 Acute cystitis without hematuria: Secondary | ICD-10-CM | POA: Diagnosis not present

## 2022-12-10 DIAGNOSIS — R5381 Other malaise: Secondary | ICD-10-CM | POA: Diagnosis present

## 2022-12-10 DIAGNOSIS — I959 Hypotension, unspecified: Secondary | ICD-10-CM | POA: Diagnosis not present

## 2022-12-10 DIAGNOSIS — T424X5A Adverse effect of benzodiazepines, initial encounter: Secondary | ICD-10-CM | POA: Diagnosis present

## 2022-12-10 DIAGNOSIS — R45851 Suicidal ideations: Secondary | ICD-10-CM | POA: Diagnosis not present

## 2022-12-10 DIAGNOSIS — E039 Hypothyroidism, unspecified: Secondary | ICD-10-CM | POA: Diagnosis present

## 2022-12-10 DIAGNOSIS — G4710 Hypersomnia, unspecified: Secondary | ICD-10-CM | POA: Diagnosis present

## 2022-12-10 DIAGNOSIS — G928 Other toxic encephalopathy: Secondary | ICD-10-CM | POA: Diagnosis present

## 2022-12-10 DIAGNOSIS — B957 Other staphylococcus as the cause of diseases classified elsewhere: Secondary | ICD-10-CM | POA: Diagnosis present

## 2022-12-10 DIAGNOSIS — F4322 Adjustment disorder with anxiety: Secondary | ICD-10-CM | POA: Diagnosis present

## 2022-12-10 DIAGNOSIS — G9341 Metabolic encephalopathy: Secondary | ICD-10-CM | POA: Diagnosis not present

## 2022-12-10 DIAGNOSIS — R4182 Altered mental status, unspecified: Secondary | ICD-10-CM | POA: Diagnosis present

## 2022-12-10 DIAGNOSIS — G8929 Other chronic pain: Secondary | ICD-10-CM | POA: Diagnosis present

## 2022-12-10 DIAGNOSIS — I129 Hypertensive chronic kidney disease with stage 1 through stage 4 chronic kidney disease, or unspecified chronic kidney disease: Secondary | ICD-10-CM | POA: Diagnosis present

## 2022-12-10 DIAGNOSIS — E876 Hypokalemia: Secondary | ICD-10-CM | POA: Diagnosis present

## 2022-12-10 DIAGNOSIS — R569 Unspecified convulsions: Secondary | ICD-10-CM | POA: Diagnosis not present

## 2022-12-10 LAB — CBC
HCT: 38.7 % (ref 36.0–46.0)
Hemoglobin: 12.8 g/dL (ref 12.0–15.0)
MCH: 31.2 pg (ref 26.0–34.0)
MCHC: 33.1 g/dL (ref 30.0–36.0)
MCV: 94.4 fL (ref 80.0–100.0)
Platelets: 243 10*3/uL (ref 150–400)
RBC: 4.1 MIL/uL (ref 3.87–5.11)
RDW: 12.7 % (ref 11.5–15.5)
WBC: 7 10*3/uL (ref 4.0–10.5)
nRBC: 0 % (ref 0.0–0.2)

## 2022-12-10 LAB — BLOOD CULTURE ID PANEL (REFLEXED) - BCID2
A.calcoaceticus-baumannii: NOT DETECTED
Bacteroides fragilis: NOT DETECTED
CTX-M ESBL: NOT DETECTED
Candida albicans: NOT DETECTED
Candida auris: NOT DETECTED
Candida glabrata: NOT DETECTED
Candida krusei: NOT DETECTED
Candida parapsilosis: NOT DETECTED
Candida tropicalis: NOT DETECTED
Carbapenem resist OXA 48 LIKE: NOT DETECTED
Carbapenem resistance IMP: NOT DETECTED
Carbapenem resistance KPC: NOT DETECTED
Carbapenem resistance NDM: NOT DETECTED
Carbapenem resistance VIM: NOT DETECTED
Cryptococcus neoformans/gattii: NOT DETECTED
Enterobacter cloacae complex: NOT DETECTED
Enterobacterales: DETECTED — AB
Enterococcus Faecium: NOT DETECTED
Enterococcus faecalis: NOT DETECTED
Escherichia coli: NOT DETECTED
Haemophilus influenzae: NOT DETECTED
Klebsiella aerogenes: NOT DETECTED
Klebsiella oxytoca: NOT DETECTED
Klebsiella pneumoniae: NOT DETECTED
Listeria monocytogenes: NOT DETECTED
Methicillin resistance mecA/C: DETECTED — AB
Neisseria meningitidis: NOT DETECTED
Proteus species: NOT DETECTED
Pseudomonas aeruginosa: NOT DETECTED
Salmonella species: NOT DETECTED
Serratia marcescens: NOT DETECTED
Staphylococcus aureus (BCID): NOT DETECTED
Staphylococcus epidermidis: DETECTED — AB
Staphylococcus lugdunensis: DETECTED — AB
Staphylococcus species: DETECTED — AB
Stenotrophomonas maltophilia: NOT DETECTED
Streptococcus agalactiae: NOT DETECTED
Streptococcus pneumoniae: NOT DETECTED
Streptococcus pyogenes: NOT DETECTED
Streptococcus species: NOT DETECTED

## 2022-12-10 LAB — BASIC METABOLIC PANEL
Anion gap: 8 (ref 5–15)
BUN: 47 mg/dL — ABNORMAL HIGH (ref 8–23)
CO2: 22 mmol/L (ref 22–32)
Calcium: 8.8 mg/dL — ABNORMAL LOW (ref 8.9–10.3)
Chloride: 109 mmol/L (ref 98–111)
Creatinine, Ser: 1.35 mg/dL — ABNORMAL HIGH (ref 0.44–1.00)
GFR, Estimated: 41 mL/min — ABNORMAL LOW (ref >60)
Glucose, Bld: 101 mg/dL — ABNORMAL HIGH (ref 70–99)
Potassium: 4 mmol/L (ref 3.5–5.1)
Sodium: 139 mmol/L (ref 135–145)

## 2022-12-10 LAB — MAGNESIUM: Magnesium: 2.1 mg/dL (ref 1.7–2.4)

## 2022-12-10 LAB — SYPHILIS: RPR W/REFLEX TO RPR TITER AND TREPONEMAL ANTIBODIES, TRADITIONAL SCREENING AND DIAGNOSIS ALGORITHM: RPR Ser Ql: NONREACTIVE

## 2022-12-10 LAB — PHOSPHORUS: Phosphorus: 2.9 mg/dL (ref 2.5–4.6)

## 2022-12-10 LAB — AMMONIA: Ammonia: 13 umol/L (ref 9–35)

## 2022-12-10 LAB — VITAMIN B12: Vitamin B-12: 485 pg/mL (ref 180–914)

## 2022-12-10 MED ORDER — LORAZEPAM 2 MG/ML IJ SOLN
0.5000 mg | Freq: Once | INTRAMUSCULAR | Status: AC
  Administered 2022-12-10: 0.5 mg via INTRAVENOUS
  Filled 2022-12-10: qty 1

## 2022-12-10 MED ORDER — SODIUM CHLORIDE 0.9 % IV SOLN
2.0000 g | INTRAVENOUS | Status: DC
  Administered 2022-12-10 – 2022-12-11 (×2): 2 g via INTRAVENOUS
  Filled 2022-12-10 (×2): qty 12.5

## 2022-12-10 MED ORDER — VANCOMYCIN HCL 500 MG/100ML IV SOLN
500.0000 mg | INTRAVENOUS | Status: DC
  Administered 2022-12-11 – 2022-12-12 (×2): 500 mg via INTRAVENOUS
  Filled 2022-12-10 (×3): qty 100

## 2022-12-10 MED ORDER — SODIUM CHLORIDE 0.9 % IV BOLUS
500.0000 mL | Freq: Once | INTRAVENOUS | Status: AC
  Administered 2022-12-10: 500 mL via INTRAVENOUS

## 2022-12-10 MED ORDER — ORAL CARE MOUTH RINSE: 15.0000 mL | OROMUCOSAL | Status: DC | PRN

## 2022-12-10 MED ORDER — VANCOMYCIN HCL IN DEXTROSE 1-5 GM/200ML-% IV SOLN
1000.0000 mg | Freq: Once | INTRAVENOUS | Status: AC
  Administered 2022-12-10: 1000 mg via INTRAVENOUS
  Filled 2022-12-10: qty 200

## 2022-12-10 NOTE — Assessment & Plan Note (Signed)
-   unclear use at home - monitor for signs of withdrawal if so, will resume CIWA

## 2022-12-10 NOTE — Progress Notes (Signed)
Pharmacy Antibiotic Note  Melissa Wolfe is a 72 y.o. female admitted on 12/09/2022 with bacteremia.  Pharmacy has been consulted for vancomycin and cefepime dosing.  Plan: Cefepime 2 gr IV q24h  Vancomycin 1000 mg IV x1 then vancomycin 500 mg IV q24h ( AUC 479, Scr 1.35 mg/dl, CrCl 28 mL/min, weight 57.7kg)   Height: 4\' 11"  (149.9 cm) Weight: 57.7 kg (127 lb 3.3 oz) IBW/kg (Calculated) : 43.2  Temp (24hrs), Avg:98.7 F (37.1 C), Min:98.1 F (36.7 C), Max:99.4 F (37.4 C)  Recent Labs  Lab 12/04/22 1348 12/09/22 1347 12/09/22 1410 12/10/22 0753  WBC 5.1 8.0  --  7.0  CREATININE 1.16* 1.32*  --  1.35*  LATICACIDVEN  --   --  1.2  --     Estimated Creatinine Clearance: 28.3 mL/min (A) (by C-G formula based on SCr of 1.35 mg/dL (H)).    Allergies  Allergen Reactions   Cephalexin Rash   Codeine Anxiety and Other (See Comments)    Hallucinations and jittery  Hallucinations and jittery  Pt states "anxiety and out of body experience"    Antimicrobials this admission: 12/9 vancomycin >>  12/29 cefepime >>   Dose adjustments this admission:   Microbiology results: 12/8 BCx: In first set, 1/1 bottle (aerobic): GNR & GPC = the GNR has no ID; the GPC is Staph epi and Staph lugeunesis (methicillin resistance detected). In 2nd set, 1/2 bottles + GPC (no organism identified).  12/9 BCx:    Adalberto Cole, PharmD, BCPS 12/10/2022 8:32 AM

## 2022-12-10 NOTE — Assessment & Plan Note (Signed)
-   on admission: 1 set with 2/2 bottles GPC and 2nd set only one bottle (aerobic) obtained and growing GNR and GPC - the GPC growing are MRSE and Staph lugden and the GNR is order of Enterobacterales (awaiting further identification) - repeat blood cultures on 12/9 BEFORE starting abx - start vanc and cefepime after repeat cultures obtained -Culture from 12/09/2022 now showing polymicrobial gram-positive species more consistent with contamination.  Awaiting further growth from other set of cultures of 12/09/2022 and for 48 hours from the repeat 12/10/2022 cultures.  If remaining negative, will discontinue vancomycin - continue cefepime for the GNR coverage until further results available

## 2022-12-10 NOTE — Progress Notes (Signed)
Progress Note    Melissa Wolfe   NUU:[id number]  DOB: 1950-01-22  DOA: 12/09/2022     0 PCP: Creola Corn, MD  Initial CC: Altered mentation  Hospital Course: Ms. Melissa Wolfe is a 72 year old female with PMH HTN, HLD, hypothyroidism, depression, alcohol abuse who presented with altered mentation.  She was recently seen in the ER on 12/04/2022 with left hip pain and prescribed prednisone.  She has had worsening confusion and irritability since then.  There has also been some concern for patient consuming alcohol. She was afebrile with no leukocytosis but underwent infectious workup given her altered mentation.  Urinalysis did not show signs of infection.  Blood cultures were obtained.  Interval History:  Seen this morning still having difficulty arousing much.  She could state a few words when asked questions but seemingly fell back asleep.  She is moving all 4 extremities equally and could follow some commands.  She adamantly denied drug use and seems to deny drinking recently.  Assessment and Plan: * Acute encephalopathy - unknown etiology at this time; differential includes infectious vs substance abuse vs medication side effect -Database reviewed, she is on chronic Ativan 1 mg daily and gabapentin 300 mg 3 times daily -Could have some gabapentin accumulation with worsened renal function and possibly misuse of Ativan; UDS noted with benzos and THC -Hold all nonessential medications at this time -Monitor for any signs of alcohol withdrawal, currently low suspicion - Ongoing infectious workup; see positive blood cultures  Positive blood culture - on admission: 1 set with 2/2 bottles GPC and 2nd set only one bottle (aerobic) obtained and growing GNR and GPC - the GPC growing are MRSE and Staph lugden and the GNR is order of Enterobacterales (awaiting further identification) - repeat blood cultures on 12/9 BEFORE starting abx - start vanc and cefepime after repeat cultures  obtained  CKD stage 3a, GFR 45-59 ml/min (HCC) - Labs prior to this year are from 2016 -Baseline creatinine currently appears to be around 1.2 and GFR ~ 40 - trend BMP  Hypokalemia - Replete as needed  Hypertension - Blood pressure elevated on admission but becoming hypotensive today - Holding BP meds for now  Hypothyroid - TSH suppressed, 0.031 -Free T4 elevated, 1.55 -Synthroid on hold for now -Given overall encephalopathy, might be mis-taking medications  Alcohol dependence (HCC) - unclear use at home - monitor for signs of withdrawal if so, will resume CIWA   Old records reviewed in assessment of this patient  Antimicrobials: Vanc 12/9 >> current  Cefepime 12/9 >> current   DVT prophylaxis:  enoxaparin (LOVENOX) injection 30 mg Start: 12/09/22 2200   Code Status:   Code Status: Full Code  Mobility Assessment (Last 72 Hours)     Mobility Assessment     Row Name 12/10/22 0800 12/09/22 2223         Does patient have an order for bedrest or is patient medically unstable No - Continue assessment No - Continue assessment      What is the highest level of mobility based on the progressive mobility assessment? Level 4 (Walks with assist in room) - Balance while marching in place and cannot step forward and back - Complete Level 4 (Walks with assist in room) - Balance while marching in place and cannot step forward and back - Complete               Barriers to discharge: none Disposition Plan:  Home Status is: Inpt  Objective: Blood pressure  97/60, pulse 76, temperature 98.6 F (37 C), temperature source Oral, resp. rate 18, height 4\' 11"  (1.499 m), weight 57.7 kg, SpO2 91%.  Examination:  Physical Exam Constitutional:      Comments: Lying in bed difficult to awaken but will briefly arouse and answer a question and then fall back asleep.  Decreased effort with wanting to follow commands  HENT:     Head: Normocephalic and atraumatic.     Mouth/Throat:      Mouth: Mucous membranes are moist.  Eyes:     Extraocular Movements: Extraocular movements intact.  Cardiovascular:     Rate and Rhythm: Normal rate and regular rhythm.  Pulmonary:     Effort: Pulmonary effort is normal. No respiratory distress.     Breath sounds: Normal breath sounds. No wheezing.  Abdominal:     General: Bowel sounds are normal. There is no distension.     Palpations: Abdomen is soft.     Tenderness: There is abdominal tenderness (Moaned with palpation).  Musculoskeletal:        General: Normal range of motion.     Cervical back: Normal range of motion and neck supple.  Skin:    General: Skin is warm and dry.  Neurological:     Comments: Unable to fully assess but moving all 4 extremities spontaneously.  Intermittent following commands      Consultants:    Procedures:    Data Reviewed: Results for orders placed or performed during the hospital encounter of 12/09/22 (from the past 24 hour(s))  Comprehensive metabolic panel     Status: Abnormal   Collection Time: 12/09/22  1:47 PM  Result Value Ref Range   Sodium 140 135 - 145 mmol/L   Potassium 3.4 (L) 3.5 - 5.1 mmol/L   Chloride 110 98 - 111 mmol/L   CO2 23 22 - 32 mmol/L   Glucose, Bld 118 (H) 70 - 99 mg/dL   BUN 48 (H) 8 - 23 mg/dL   Creatinine, Ser 1.61 (H) 0.44 - 1.00 mg/dL   Calcium 9.2 8.9 - 09.6 mg/dL   Total Protein 6.8 6.5 - 8.1 g/dL   Albumin 3.5 3.5 - 5.0 g/dL   AST 25 15 - 41 U/L   ALT 19 0 - 44 U/L   Alkaline Phosphatase 92 38 - 126 U/L   Total Bilirubin 1.0 <1.2 mg/dL   GFR, Estimated 42 (L) >60 mL/min   Anion gap 7 5 - 15  Ethanol     Status: None   Collection Time: 12/09/22  1:47 PM  Result Value Ref Range   Alcohol, Ethyl (B) <10 <10 mg/dL  Lipase, blood     Status: None   Collection Time: 12/09/22  1:47 PM  Result Value Ref Range   Lipase 26 11 - 51 U/L  Troponin I (High Sensitivity)     Status: None   Collection Time: 12/09/22  1:47 PM  Result Value Ref Range   Troponin  I (High Sensitivity) 9 <18 ng/L  CBC with Differential     Status: Abnormal   Collection Time: 12/09/22  1:47 PM  Result Value Ref Range   WBC 8.0 4.0 - 10.5 K/uL   RBC 4.30 3.87 - 5.11 MIL/uL   Hemoglobin 13.5 12.0 - 15.0 g/dL   HCT 04.5 40.9 - 81.1 %   MCV 93.5 80.0 - 100.0 fL   MCH 31.4 26.0 - 34.0 pg   MCHC 33.6 30.0 - 36.0 g/dL   RDW 91.4 78.2 -  15.5 %   Platelets 235 150 - 400 K/uL   nRBC 0.0 0.0 - 0.2 %   Neutrophils Relative % 64 %   Neutro Abs 5.1 1.7 - 7.7 K/uL   Lymphocytes Relative 18 %   Lymphs Abs 1.4 0.7 - 4.0 K/uL   Monocytes Relative 12 %   Monocytes Absolute 1.0 0.1 - 1.0 K/uL   Eosinophils Relative 4 %   Eosinophils Absolute 0.3 0.0 - 0.5 K/uL   Basophils Relative 1 %   Basophils Absolute 0.1 0.0 - 0.1 K/uL   Immature Granulocytes 1 %   Abs Immature Granulocytes 0.08 (H) 0.00 - 0.07 K/uL  Blood culture (routine x 2)     Status: None (Preliminary result)   Collection Time: 12/09/22  2:05 PM   Specimen: BLOOD RIGHT FOREARM  Result Value Ref Range   Specimen Description      BLOOD RIGHT FOREARM Performed at [facility], [address], [place], [place] [address]    Special Requests      BOTTLES DRAWN AEROBIC AND ANAEROBIC Blood Culture results may not be optimal due to an inadequate volume of blood received in culture bottles Performed at [facility], [address] [place], [place] [address]    Culture  Setup Time      GRAM POSITIVE COCCI IN BOTH AEROBIC AND ANAEROBIC BOTTLES CRITICAL VALUE NOTED.  VALUE IS CONSISTENT WITH PREVIOUSLY REPORTED AND CALLED VALUE. Performed at [facility], [address], [place], [place] [address]    Culture GRAM POSITIVE COCCI    Report Status PENDING   I-Stat Lactic Acid     Status: None   Collection Time: 12/09/22  2:10 PM  Result Value Ref Range   Lactic Acid, Venous 1.2 0.5 - 1.9 mmol/L  Blood culture (routine x 2)     Status: None (Preliminary result)   Collection Time: 12/09/22   2:10 PM   Specimen: BLOOD  Result Value Ref Range   Specimen Description      BLOOD LEFT ANTECUBITAL Performed at [facility], [address] [place], [place] [address]    Special Requests      BOTTLES DRAWN AEROBIC ONLY Blood Culture results may not be optimal due to an inadequate volume of blood received in culture bottles Performed at [facility], [address], [place], [place] [address]    Culture  Setup Time      GRAM NEGATIVE RODS GRAM POSITIVE COCCI AEROBIC BOTTLE ONLY CRITICAL RESULT CALLED TO, READ BACK BY AND VERIFIED WITH: L POINDEXTER,[email or web address]  12/10/22 MK Performed at [facility], [address] [place], [place] [address]    Culture GRAM POSITIVE COCCI GRAM NEGATIVE RODS     Report Status PENDING   Blood Culture ID Panel (Reflexed)     Status: Abnormal   Collection Time: 12/09/22  2:10 PM  Result Value Ref Range   Enterococcus faecalis NOT DETECTED NOT DETECTED   Enterococcus Faecium NOT DETECTED NOT DETECTED   Listeria monocytogenes NOT DETECTED NOT DETECTED   Staphylococcus species DETECTED (A) NOT DETECTED   Staphylococcus aureus (BCID) NOT DETECTED NOT DETECTED   Staphylococcus epidermidis DETECTED (A) NOT DETECTED   Staphylococcus lugdunensis DETECTED (A) NOT DETECTED   Streptococcus species NOT DETECTED NOT DETECTED   Streptococcus agalactiae NOT DETECTED NOT DETECTED   Streptococcus pneumoniae NOT DETECTED NOT DETECTED   Streptococcus pyogenes NOT DETECTED NOT DETECTED   A.calcoaceticus-baumannii NOT DETECTED NOT DETECTED  Bacteroides fragilis NOT DETECTED NOT DETECTED   Enterobacterales DETECTED (A) NOT DETECTED   Enterobacter cloacae complex NOT DETECTED NOT DETECTED   Escherichia coli NOT DETECTED NOT DETECTED   Klebsiella aerogenes NOT DETECTED NOT DETECTED   Klebsiella oxytoca NOT DETECTED NOT DETECTED   Klebsiella pneumoniae NOT DETECTED NOT DETECTED   Proteus species NOT DETECTED NOT DETECTED    Salmonella species NOT DETECTED NOT DETECTED   Serratia marcescens NOT DETECTED NOT DETECTED   Haemophilus influenzae NOT DETECTED NOT DETECTED   Neisseria meningitidis NOT DETECTED NOT DETECTED   Pseudomonas aeruginosa NOT DETECTED NOT DETECTED   Stenotrophomonas maltophilia NOT DETECTED NOT DETECTED   Candida albicans NOT DETECTED NOT DETECTED   Candida auris NOT DETECTED NOT DETECTED   Candida glabrata NOT DETECTED NOT DETECTED   Candida krusei NOT DETECTED NOT DETECTED   Candida parapsilosis NOT DETECTED NOT DETECTED   Candida tropicalis NOT DETECTED NOT DETECTED   Cryptococcus neoformans/gattii NOT DETECTED NOT DETECTED   CTX-M ESBL NOT DETECTED NOT DETECTED   Carbapenem resistance IMP NOT DETECTED NOT DETECTED   Carbapenem resistance KPC NOT DETECTED NOT DETECTED   Methicillin resistance mecA/C DETECTED (A) NOT DETECTED   Carbapenem resistance NDM NOT DETECTED NOT DETECTED   Carbapenem resist OXA 48 LIKE NOT DETECTED NOT DETECTED   Carbapenem resistance VIM NOT DETECTED NOT DETECTED  Troponin I (High Sensitivity)     Status: None   Collection Time: 12/09/22  4:46 PM  Result Value Ref Range   Troponin I (High Sensitivity) 7 <18 ng/L  TSH     Status: Abnormal   Collection Time: 12/09/22  4:46 PM  Result Value Ref Range   TSH 0.031 (L) 0.350 - 4.500 uIU/mL  Urinalysis, w/ Reflex to Culture (Infection Suspected) -Urine, Clean Catch     Status: Abnormal   Collection Time: 12/09/22  6:35 PM  Result Value Ref Range   Specimen Source URINE, CLEAN CATCH    Color, Urine YELLOW YELLOW   APPearance HAZY (A) CLEAR   Specific Gravity, Urine 1.020 1.005 - 1.030   pH 5.0 5.0 - 8.0   Glucose, UA NEGATIVE NEGATIVE mg/dL   Hgb urine dipstick NEGATIVE NEGATIVE   Bilirubin Urine NEGATIVE NEGATIVE   Ketones, ur 5 (A) NEGATIVE mg/dL   Protein, ur NEGATIVE NEGATIVE mg/dL   Nitrite NEGATIVE NEGATIVE   Leukocytes,Ua SMALL (A) NEGATIVE   RBC / HPF 0-5 0 - 5 RBC/hpf   WBC, UA 6-10 0 - 5  WBC/hpf   Bacteria, UA RARE (A) NONE SEEN   Squamous Epithelial / HPF 6-10 0 - 5 /HPF  Rapid urine drug screen (hospital performed)     Status: Abnormal   Collection Time: 12/09/22  6:35 PM  Result Value Ref Range   Opiates NONE DETECTED NONE DETECTED   Cocaine NONE DETECTED NONE DETECTED   Benzodiazepines POSITIVE (A) NONE DETECTED   Amphetamines NONE DETECTED NONE DETECTED   Tetrahydrocannabinol POSITIVE (A) NONE DETECTED   Barbiturates NONE DETECTED NONE DETECTED  T4, free     Status: Abnormal   Collection Time: 12/09/22  6:57 PM  Result Value Ref Range   Free T4 1.55 (H) 0.61 - 1.12 ng/dL  Ammonia     Status: None   Collection Time: 12/10/22  7:53 AM  Result Value Ref Range   Ammonia 13 9 - 35 umol/L  Magnesium     Status: None   Collection Time: 12/10/22  7:53 AM  Result Value Ref Range   Magnesium 2.1 1.7 -  2.4 mg/dL  Phosphorus     Status: None   Collection Time: 12/10/22  7:53 AM  Result Value Ref Range   Phosphorus 2.9 2.5 - 4.6 mg/dL  Vitamin V78     Status: None   Collection Time: 12/10/22  7:53 AM  Result Value Ref Range   Vitamin B-12 485 180 - 914 pg/mL  Basic metabolic panel     Status: Abnormal   Collection Time: 12/10/22  7:53 AM  Result Value Ref Range   Sodium 139 135 - 145 mmol/L   Potassium 4.0 3.5 - 5.1 mmol/L   Chloride 109 98 - 111 mmol/L   CO2 22 22 - 32 mmol/L   Glucose, Bld 101 (H) 70 - 99 mg/dL   BUN 47 (H) 8 - 23 mg/dL   Creatinine, Ser 4.69 (H) 0.44 - 1.00 mg/dL   Calcium 8.8 (L) 8.9 - 10.3 mg/dL   GFR, Estimated 41 (L) >60 mL/min   Anion gap 8 5 - 15  CBC     Status: None   Collection Time: 12/10/22  7:53 AM  Result Value Ref Range   WBC 7.0 4.0 - 10.5 K/uL   RBC 4.10 3.87 - 5.11 MIL/uL   Hemoglobin 12.8 12.0 - 15.0 g/dL   HCT 62.9 52.8 - 41.3 %   MCV 94.4 80.0 - 100.0 fL   MCH 31.2 26.0 - 34.0 pg   MCHC 33.1 30.0 - 36.0 g/dL   RDW 24.4 01.0 - 27.2 %   Platelets 243 150 - 400 K/uL   nRBC 0.0 0.0 - 0.2 %    I have reviewed  pertinent nursing notes, vitals, labs, and images as necessary. I have ordered labwork to follow up on as indicated.  I have reviewed the last notes from staff over past 24 hours. I have discussed patient's care plan and test results with nursing staff, CM/SW, and other staff as appropriate.  Time spent: Greater than 50% of the 55 minute visit was spent in counseling/coordination of care for the patient as laid out in the A&P.   LOS: 0 days   Lewie Chamber, MD [facility] 12/10/2022, 1:20 PM

## 2022-12-10 NOTE — Assessment & Plan Note (Signed)
-   unknown etiology at this time; differential includes infectious vs substance abuse vs medication side effect -Database reviewed, she is on chronic Ativan 1 mg daily and gabapentin 300 mg 3 times daily -Could have some gabapentin accumulation with worsened renal function and possibly misuse of Ativan; UDS noted with benzos and THC -Hold all nonessential medications at this time -Monitor for any signs of alcohol withdrawal, currently low suspicion - Ongoing infectious workup; see positive blood cultures -Patient found on floor during shift change on the morning of 12/11/2022.  She is unsure how she got on the floor and does not recall events.  Obtain an EEG today -[employer]ate investment [employer]

## 2022-12-10 NOTE — Assessment & Plan Note (Signed)
-   Blood pressure elevated on admission but becoming hypotensive today - Holding BP meds for now

## 2022-12-10 NOTE — Assessment & Plan Note (Addendum)
-   TSH suppressed, 0.031 -Free T4 elevated, 1.55 -Synthroid on hold for now -Given overall encephalopathy, might be mis-taking medications

## 2022-12-10 NOTE — Assessment & Plan Note (Signed)
-   Labs prior to this year are from 2016 -Baseline creatinine currently appears to be around 1.2 and GFR ~ 40 - trend BMP

## 2022-12-10 NOTE — Assessment & Plan Note (Signed)
 Replete as needed

## 2022-12-10 NOTE — Plan of Care (Signed)
  Problem: Clinical Measurements: Goal: Ability to maintain clinical measurements within normal limits will improve Outcome: Progressing Goal: Will remain free from infection Outcome: Progressing Goal: Diagnostic test results will improve Outcome: Progressing Goal: Respiratory complications will improve Outcome: Progressing Goal: Cardiovascular complication will be avoided Outcome: Progressing   Problem: Activity: Goal: Risk for activity intolerance will decrease Outcome: Progressing   Problem: Coping: Goal: Level of anxiety will decrease Outcome: Progressing   Problem: Safety: Goal: Ability to remain free from injury will improve Outcome: Progressing   Problem: Skin Integrity: Goal: Risk for impaired skin integrity will decrease Outcome: Progressing

## 2022-12-10 NOTE — Plan of Care (Signed)

## 2022-12-10 NOTE — Hospital Course (Signed)
Melissa Wolfe is a 72 year old female with past medical history of hypertension, hyperlipidemia, hypothyroidism  depression, alcohol abuse disorder hospital with altered mental status.  Of note patient was recently seen in the ED on 12/04/2022 for left hip pain and was prescribed prednisone.  Since that time she had been having worsening confusion and irritability.  There was some concern regarding patient consuming alcohol as well.  In the ED patient was afebrile without any leukocytosis.  Urinalysis was negative.  Blood cultures were obtained.  Patient was then admitted hospital for further evaluation and treatment.    Assessment and Plan:  * Acute encephalopathy Unknown etiology.  Could be secondary to infectious/substance abuse/medication effect.  Patient is on Ativan and gabapentin.  Possibility of side effect with underlying decreased urine function.  Urine drug screen with benzos and THC.  Monitor for signs of alcohol withdrawal.  Noted to be on the floor on 12/19/2022.  EEG was done without any signs of seizure-like activity.   Fall on the floor. On 12/11/2022 no external  injury.  EEG without signs of seizure.  Will get PT OT eval  Positive blood culture Polymicrobial gram-positive species including Staph hominis, capitis, epidermidis from blood culture of 12/09/2022.Marland Kitchen Repeat blood culture on 12/ 9 negative so far.  Temperature max of 99 F.  Has been started on vancomycin and cefepime. Marland Kitchen  Awaiting further growth from other set of cultures of 12/09/2022 and for 48 hours from the repeat 12/10/2022 cultures.  If remaining negative, will discontinue vancomycin - continue cefepime for the GNR coverage until further results available    Depression, major, recurrent, moderate (HCC) Had some suicidal ideation 12/11/2022 with expression of wanting to jump off the bridge..  Currently on sitter.  Continue Zoloft.  Will consult psychiatry.   CKD stage 3a, GFR 45-59 ml/min (HCC) Previous labs from 2016  with baseline creatinine around 1.2 and GFR ~ 40.  Creatinine at this time at 0.9.    Hypokalemia Mild.-Level of 3.2 today.  Will continue to replace orally.  Check BMP in AM.   Hypertension Had episode of hypotension so antihypertensives on hold.  Currently blood pressure is elevated.  Will reorder amlodipine low-dose.  Patient is also on clonidine, HCTZ, losartan at home.   Hypothyroidism - TSH suppressed, 0.031 with free T4 elevated at 1.5.  Synthroid on hold at this time.  Could resume with the lower dose by AM.   Alcohol use disorder. Uncertain at this time.  Will continue to monitor closely for withdrawals.

## 2022-12-11 ENCOUNTER — Inpatient Hospital Stay (HOSPITAL_COMMUNITY)
Admit: 2022-12-11 | Discharge: 2022-12-11 | Disposition: A | Discharge status: Home / Self Care | Payer: Medicare HMO | Attending: Internal Medicine | Admitting: Internal Medicine

## 2022-12-11 DIAGNOSIS — F331 Major depressive disorder, recurrent, moderate: Secondary | ICD-10-CM | POA: Diagnosis not present

## 2022-12-11 DIAGNOSIS — W19XXXA Unspecified fall, initial encounter: Secondary | ICD-10-CM

## 2022-12-11 DIAGNOSIS — R7881 Bacteremia: Secondary | ICD-10-CM | POA: Diagnosis not present

## 2022-12-11 DIAGNOSIS — R569 Unspecified convulsions: Secondary | ICD-10-CM

## 2022-12-11 DIAGNOSIS — G9340 Encephalopathy, unspecified: Secondary | ICD-10-CM | POA: Diagnosis not present

## 2022-12-11 LAB — CBC WITH DIFFERENTIAL/PLATELET
Abs Immature Granulocytes: 0.09 10*3/uL — ABNORMAL HIGH (ref 0.00–0.07)
Basophils Absolute: 0.1 10*3/uL (ref 0.0–0.1)
Basophils Relative: 1 %
Eosinophils Absolute: 0.4 10*3/uL (ref 0.0–0.5)
Eosinophils Relative: 5 %
HCT: 37.3 % (ref 36.0–46.0)
Hemoglobin: 12.4 g/dL (ref 12.0–15.0)
Immature Granulocytes: 1 %
Lymphocytes Relative: 22 %
Lymphs Abs: 1.4 10*3/uL (ref 0.7–4.0)
MCH: 31.1 pg (ref 26.0–34.0)
MCHC: 33.2 g/dL (ref 30.0–36.0)
MCV: 93.5 fL (ref 80.0–100.0)
Monocytes Absolute: 0.7 10*3/uL (ref 0.1–1.0)
Monocytes Relative: 11 %
Neutro Abs: 4 10*3/uL (ref 1.7–7.7)
Neutrophils Relative %: 60 %
Platelets: 214 10*3/uL (ref 150–400)
RBC: 3.99 MIL/uL (ref 3.87–5.11)
RDW: 12.3 % (ref 11.5–15.5)
WBC: 6.7 10*3/uL (ref 4.0–10.5)
nRBC: 0 % (ref 0.0–0.2)

## 2022-12-11 LAB — BASIC METABOLIC PANEL
Anion gap: 10 (ref 5–15)
BUN: 37 mg/dL — ABNORMAL HIGH (ref 8–23)
CO2: 22 mmol/L (ref 22–32)
Calcium: 9.2 mg/dL (ref 8.9–10.3)
Chloride: 109 mmol/L (ref 98–111)
Creatinine, Ser: 1.15 mg/dL — ABNORMAL HIGH (ref 0.44–1.00)
GFR, Estimated: 50 mL/min — ABNORMAL LOW (ref >60)
Glucose, Bld: 100 mg/dL — ABNORMAL HIGH (ref 70–99)
Potassium: 3.5 mmol/L (ref 3.5–5.1)
Sodium: 141 mmol/L (ref 135–145)

## 2022-12-11 LAB — MAGNESIUM: Magnesium: 2.1 mg/dL (ref 1.7–2.4)

## 2022-12-11 LAB — T3, FREE: T3, Free: 1.7 pg/mL — ABNORMAL LOW (ref 2.0–4.4)

## 2022-12-11 MED ORDER — HALOPERIDOL LACTATE 5 MG/ML IJ SOLN
5.0000 mg | Freq: Four times a day (QID) | INTRAMUSCULAR | Status: DC | PRN
  Administered 2022-12-11: 5 mg via INTRAVENOUS
  Filled 2022-12-11: qty 1

## 2022-12-11 MED ORDER — SODIUM CHLORIDE 0.9 % IV SOLN
2.0000 g | Freq: Two times a day (BID) | INTRAVENOUS | Status: DC
  Administered 2022-12-11 – 2022-12-12 (×3): 2 g via INTRAVENOUS
  Filled 2022-12-11 (×3): qty 12.5

## 2022-12-11 NOTE — Progress Notes (Signed)
Brother, Nena Alexander, answered phone at [facility] home who is the emergency contact for patient. Informed him of patient being placed in 2 point soft wrist restraints. 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

## 2022-12-11 NOTE — Assessment & Plan Note (Addendum)
-   patient became tearful on 12/10 and stating that she wanted to die; says she'd jump off a bridge probably. This appears to be moreso situational from possibly caring for mom at home and being overwhelmed - for now will continue sitter - once more medically improved, can have psych evaluate, but currently she's just now becoming more alert and awake and may be more metabolically stabilizing  - continue zoloft for now

## 2022-12-11 NOTE — Plan of Care (Signed)
  Problem: Clinical Measurements: Goal: Diagnostic test results will improve Outcome: Progressing Goal: Respiratory complications will improve Outcome: Progressing Goal: Cardiovascular complication will be avoided Outcome: Progressing   Problem: Safety: Goal: Ability to remain free from injury will improve Outcome: Progressing   Problem: Safety: Goal: Non-violent Restraint(s) Outcome: Progressing

## 2022-12-11 NOTE — Progress Notes (Signed)
Progress Note    Melissa Wolfe   WEX:[id number]  DOB: 03-Jan-1950  DOA: 12/09/2022     1 PCP: Creola Corn, MD  Initial CC: Altered mentation  Hospital Course: Melissa Wolfe is a 72 year old female with PMH HTN, HLD, hypothyroidism, depression, alcohol abuse who presented with altered mentation.  She was recently seen in the ER on 12/04/2022 with left hip pain and prescribed prednisone.  She has had worsening confusion and irritability since then.  There has also been some concern for Melissa Wolfe consuming alcohol. She was afebrile with no leukocytosis but underwent infectious workup given her altered mentation.  Urinalysis did not show signs of infection.  Blood cultures were obtained.  Interval History:  Melissa Wolfe seen multiple times this morning.  Each time I walked past her room there was a new issue going on.  Passing by the last time she had voiced suicidal ideation to the sitter.  She was tearful when talking with her but still would not elaborate much.  Asked her if she was stressed about taking care of her mom at home to which she said yes.  When asking her how she would kill herself, she said "probably jump off a bridge". She otherwise just continued to cry and stated that she was unhappy.  Assessment and Plan: * Acute encephalopathy - unknown etiology at this time; differential includes infectious vs substance abuse vs medication side effect -Database reviewed, she is on chronic Ativan 1 mg daily and gabapentin 300 mg 3 times daily -Could have some gabapentin accumulation with worsened renal function and possibly misuse of Ativan; UDS noted with benzos and THC -Hold all nonessential medications at this time -Monitor for any signs of alcohol withdrawal, currently low suspicion - Ongoing infectious workup; see positive blood cultures -Melissa Wolfe found on floor during shift change on the morning of 12/11/2022.  She is unsure how she got on the floor and does not recall events.  Obtain an  EEG today -Continue sitter  Fall - Melissa Wolfe found on the floor next to her bed on the morning of 12/11/2022 during nursing shift report; unclear how long she has been on the floor or if any trauma sustained -Full assessment done and low suspicion for injury at this time.  For now continuing neurochecks every 4 hours for 24 hours and if stable can discontinue -Still unclear etiology for her presentation overall - Obtaining EEG -Continue monitoring for signs of withdrawal  Positive blood culture - on admission: 1 set with 2/2 bottles GPC and 2nd set only one bottle (aerobic) obtained and growing GNR and GPC - the GPC growing are MRSE and Staph lugden and the GNR is order of Enterobacterales (awaiting further identification) - repeat blood cultures on 12/9 BEFORE starting abx - start vanc and cefepime after repeat cultures obtained -Culture from 12/09/2022 now showing polymicrobial gram-positive species more consistent with contamination.  Awaiting further growth from other set of cultures of 12/09/2022 and for 48 hours from the repeat 12/10/2022 cultures.  If remaining negative, will discontinue vancomycin - continue cefepime for the GNR coverage until further results available   Depression, major, recurrent, moderate (HCC) - Melissa Wolfe became tearful on 12/10 and stating that she wanted to die; says she'd jump off a bridge probably. This appears to be moreso situational from possibly caring for mom at home and being overwhelmed - for now will continue sitter - once more medically improved, can have psych evaluate, but currently she's just now becoming more alert and awake and may  be more metabolically stabilizing  - continue zoloft for now  CKD stage 3a, GFR 45-59 ml/min (HCC) - Labs prior to this year are from 2016 -Baseline creatinine currently appears to be around 1.2 and GFR ~ 40 - trend BMP  Hypokalemia - Replete as needed  Hypertension - Blood pressure elevated on admission but  becoming hypotensive on 12/9; now has improved again - Holding BP meds for now  Hypothyroid - TSH suppressed, 0.031 -Free T4 elevated, 1.55 -Synthroid on hold for now -Given overall encephalopathy, might be mis-taking medications  Alcohol dependence (HCC) - unclear use at home - monitor for signs of withdrawal if so, will resume CIWA   Old records reviewed in assessment of this Melissa Wolfe  Antimicrobials: Vanc 12/9 >> current  Cefepime 12/9 >> current   DVT prophylaxis:  enoxaparin (LOVENOX) injection 30 mg Start: 12/09/22 2200   Code Status:   Code Status: Full Code  Mobility Assessment (Last 72 Hours)     Mobility Assessment     Row Name 12/11/22 0800 12/10/22 2155 12/10/22 1621 12/10/22 1600 12/10/22 0800   Does Melissa Wolfe have an order for bedrest or is Melissa Wolfe medically unstable No - Continue assessment Yes- Bedfast (Level 1) - Complete -- Yes- Bedfast (Level 1) - Complete No - Continue assessment   What is the highest level of mobility based on the progressive mobility assessment? Level 3 (Stands with assist) - Balance while standing  and cannot march in place Level 1 (Bedfast) - Unable to balance while sitting on edge of bed -- Level 1 (Bedfast) - Unable to balance while sitting on edge of bed Level 4 (Walks with assist in room) - Balance while marching in place and cannot step forward and back - Complete   Is the above level different from baseline mobility prior to current illness? -- Yes - Recommend PT order Yes - Recommend PT order -- --    Row Name 12/09/22 2223           Does Melissa Wolfe have an order for bedrest or is Melissa Wolfe medically unstable No - Continue assessment       What is the highest level of mobility based on the progressive mobility assessment? Level 4 (Walks with assist in room) - Balance while marching in place and cannot step forward and back - Complete                Barriers to discharge: none Disposition Plan:  Home Status is:  Inpt  Objective: Blood pressure (!) 143/79, pulse 96, temperature 98 F (36.7 C), temperature source Oral, resp. rate 16, height 4\' 11"  (1.499 m), weight 57.7 kg, SpO2 95%.  Examination:  Physical Exam Constitutional:      Comments: More awake today and was eating breakfast when seen.  Still somewhat confused and when seen later again in the morning, she was very tearful and depressed appearing  HENT:     Head: Normocephalic and atraumatic.     Mouth/Throat:     Mouth: Mucous membranes are moist.  Eyes:     Extraocular Movements: Extraocular movements intact.  Cardiovascular:     Rate and Rhythm: Normal rate and regular rhythm.  Pulmonary:     Effort: Pulmonary effort is normal. No respiratory distress.     Breath sounds: Normal breath sounds. No wheezing.  Abdominal:     General: Bowel sounds are normal. There is no distension.     Palpations: Abdomen is soft.     Tenderness: There is no  abdominal tenderness.  Musculoskeletal:        General: Normal range of motion.     Cervical back: Normal range of motion and neck supple.  Skin:    General: Skin is warm and dry.  Neurological:     Mental Status: She is disoriented.  Psychiatric:        Mood and Affect: Mood is depressed.        Cognition and Memory: Cognition is impaired. Memory is impaired.      Consultants:    Procedures:    Data Reviewed: Results for orders placed or performed during the hospital encounter of 12/09/22 (from the past 24 hour(s))  Basic metabolic panel     Status: Abnormal   Collection Time: 12/11/22  8:02 AM  Result Value Ref Range   Sodium 141 135 - 145 mmol/L   Potassium 3.5 3.5 - 5.1 mmol/L   Chloride 109 98 - 111 mmol/L   CO2 22 22 - 32 mmol/L   Glucose, Bld 100 (H) 70 - 99 mg/dL   BUN 37 (H) 8 - 23 mg/dL   Creatinine, Ser 1.61 (H) 0.44 - 1.00 mg/dL   Calcium 9.2 8.9 - 09.6 mg/dL   GFR, Estimated 50 (L) >60 mL/min   Anion gap 10 5 - 15  CBC with Differential/Platelet     Status:  Abnormal   Collection Time: 12/11/22  8:02 AM  Result Value Ref Range   WBC 6.7 4.0 - 10.5 K/uL   RBC 3.99 3.87 - 5.11 MIL/uL   Hemoglobin 12.4 12.0 - 15.0 g/dL   HCT 04.5 40.9 - 81.1 %   MCV 93.5 80.0 - 100.0 fL   MCH 31.1 26.0 - 34.0 pg   MCHC 33.2 30.0 - 36.0 g/dL   RDW 91.4 78.2 - 95.6 %   Platelets 214 150 - 400 K/uL   nRBC 0.0 0.0 - 0.2 %   Neutrophils Relative % 60 %   Neutro Abs 4.0 1.7 - 7.7 K/uL   Lymphocytes Relative 22 %   Lymphs Abs 1.4 0.7 - 4.0 K/uL   Monocytes Relative 11 %   Monocytes Absolute 0.7 0.1 - 1.0 K/uL   Eosinophils Relative 5 %   Eosinophils Absolute 0.4 0.0 - 0.5 K/uL   Basophils Relative 1 %   Basophils Absolute 0.1 0.0 - 0.1 K/uL   Immature Granulocytes 1 %   Abs Immature Granulocytes 0.09 (H) 0.00 - 0.07 K/uL  Magnesium     Status: None   Collection Time: 12/11/22  8:02 AM  Result Value Ref Range   Magnesium 2.1 1.7 - 2.4 mg/dL    I have reviewed pertinent nursing notes, vitals, labs, and images as necessary. I have ordered labwork to follow up on as indicated.  I have reviewed the last notes from staff over past 24 hours. I have discussed Melissa Wolfe's care plan and test results with nursing staff, CM/SW, and other staff as appropriate.  Time spent: Greater than 50% of the 55 minute visit was spent in counseling/coordination of care for the Melissa Wolfe as laid out in the A&P.   LOS: 1 day   Lewie Chamber, MD [facility] 12/11/2022, 3:16 PM

## 2022-12-11 NOTE — Procedures (Signed)
Patient Name: Melissa Wolfe  MRN: [id number]  Epilepsy Attending: Charlsie Quest  Referring Physician/Provider: Lewie Chamber, MD  Date: 12/11/2022 Duration: 21.49 mins  Patient history: 72yo F with ams getting eeg to evaluate for seizure  Level of alertness: Awake  AEDs during EEG study: None  Technical aspects: This EEG study was done with scalp electrodes positioned according to the 10-20 International system of electrode placement. Electrical activity was reviewed with band pass filter of 1-70Hz , sensitivity of 7 uV/mm, display speed of 62mm/sec with a 60Hz  notched filter applied as appropriate. EEG data were recorded continuously and digitally stored.  Video monitoring was available and reviewed as appropriate.  Description: The posterior dominant rhythm consists of 8 Hz activity of moderate voltage (25-35 uV) seen predominantly in posterior head regions, symmetric and reactive to eye opening and eye closing. Hyperventilation and photic stimulation were not performed.     Of note, study was technically difficult due to significant electrode and movement artifact  IMPRESSION: This technically difficult study is within normal limits. No seizures or epileptiform discharges were seen throughout the recording.  A normal interictal EEG does not exclude the diagnosis of epilepsy.   Alayshia Marini Annabelle Harman

## 2022-12-11 NOTE — Progress Notes (Signed)
New orders received for PRN Ativan which was ineffective. Pt still very confused and agitated, pulling and biting at mitts, trying to get out of bed, pt kicking at staff and attempting to bite. New orders for 2 point soft wrist restraints.

## 2022-12-11 NOTE — Significant Event (Signed)
Upon rounding for bedside shift report, patient was found in the corner of the room underneath the computer out of her restraints. MD notified. Unable to reach family at this time. Please see doc flow sheet for post fall huddle and VS.

## 2022-12-11 NOTE — Progress Notes (Signed)
Pt A/O X 1, very agitated, pulling at covered IV, continuously trying to get out of bed, not following verbal direction. Fall mats placed and mitts have been applied that patient is biting and pulling off. No PRN meds on Mar, provider notified.Awaiting new orders.

## 2022-12-11 NOTE — Progress Notes (Signed)
EEG complete - results pending 

## 2022-12-11 NOTE — Progress Notes (Signed)
Pharmacy Antibiotic Note  Melissa Wolfe is a 72 y.o. female admitted on 12/09/2022 with bacteremia.  Pharmacy has been consulted for vancomycin and cefepime dosing.  Scr has improved today to 1.15 from 1.35.   Plan: Increase Cefepime to 2 gr IV q12h  Continue vancomycin 500 mg IV q24h ( AUC 479, Scr 1.35 mg/dl, CrCl 28 mL/min, weight 57.7kg)   Height: 4\' 11"  (149.9 cm) Weight: 57.7 kg (127 lb 3.3 oz) IBW/kg (Calculated) : 43.2  Temp (24hrs), Avg:98.4 F (36.9 C), Min:98 F (36.7 C), Max:98.8 F (37.1 C)  Recent Labs  Lab 12/04/22 1348 12/09/22 1347 12/09/22 1410 12/10/22 0753 12/11/22 0802  WBC 5.1 8.0  --  7.0 6.7  CREATININE 1.16* 1.32*  --  1.35* 1.15*  LATICACIDVEN  --   --  1.2  --   --     Estimated Creatinine Clearance: 33.2 mL/min (A) (by C-G formula based on SCr of 1.15 mg/dL (H)).    Allergies  Allergen Reactions   Cephalexin Rash   Codeine Anxiety and Other (See Comments)    Hallucinations and jittery  Hallucinations and jittery  Pt states "anxiety and out of body experience"    Antimicrobials this admission: 12/9 vancomycin >>  12/29 cefepime >>   Dose adjustments this admission:   Microbiology results: 12/8 BCx: In first set, 1/1 bottle (aerobic): GNR & GPC = the GNR has no ID; the GPC is Staph epi and Staph lugeunesis (methicillin resistance detected). In 2nd set, 1/2 bottles + GPC (no organism identified).  12/9 BCx:    Adalberto Cole, PharmD, BCPS 12/11/2022 10:12 AM

## 2022-12-11 NOTE — Plan of Care (Signed)
  Problem: Education: Goal: Knowledge of General Education information will improve Description: Including pain rating scale, medication(s)/side effects and non-pharmacologic comfort measures Outcome: Not Progressing   Problem: Health Behavior/Discharge Planning: Goal: Ability to manage health-related needs will improve Outcome: Not Progressing   Problem: Clinical Measurements: Goal: Ability to maintain clinical measurements within normal limits will improve Outcome: Not Progressing Goal: Will remain free from infection Outcome: Not Progressing Goal: Diagnostic test results will improve Outcome: Not Progressing Goal: Respiratory complications will improve Outcome: Not Progressing Goal: Cardiovascular complication will be avoided Outcome: Not Progressing   Problem: Activity: Goal: Risk for activity intolerance will decrease Outcome: Not Progressing   Problem: Nutrition: Goal: Adequate nutrition will be maintained Outcome: Not Progressing   Problem: Coping: Goal: Level of anxiety will decrease Outcome: Not Progressing   Problem: Elimination: Goal: Will not experience complications related to bowel motility Outcome: Not Progressing Goal: Will not experience complications related to urinary retention Outcome: Not Progressing   Problem: Pain Management: Goal: General experience of comfort will improve Outcome: Not Progressing   Problem: Safety: Goal: Ability to remain free from injury will improve Outcome: Not Progressing   Problem: Skin Integrity: Goal: Risk for impaired skin integrity will decrease Outcome: Not Progressing   Problem: Safety: Goal: Non-violent Restraint(s) Outcome: Not Progressing

## 2022-12-11 NOTE — Assessment & Plan Note (Signed)
-   Patient found on the floor next to her bed on the morning of 12/11/2022 during nursing shift report; unclear how long she has been on the floor or if any trauma sustained -Full assessment done and low suspicion for injury at this time.  For now continuing neurochecks every 4 hours for 24 hours and if stable can discontinue -Still unclear etiology for her presentation overall - Obtaining EEG -Continue monitoring for signs of withdrawal

## 2022-12-12 DIAGNOSIS — F331 Major depressive disorder, recurrent, moderate: Secondary | ICD-10-CM | POA: Diagnosis not present

## 2022-12-12 DIAGNOSIS — R7881 Bacteremia: Secondary | ICD-10-CM | POA: Diagnosis not present

## 2022-12-12 DIAGNOSIS — W19XXXA Unspecified fall, initial encounter: Secondary | ICD-10-CM | POA: Diagnosis not present

## 2022-12-12 DIAGNOSIS — G9340 Encephalopathy, unspecified: Secondary | ICD-10-CM | POA: Diagnosis not present

## 2022-12-12 LAB — CBC WITH DIFFERENTIAL/PLATELET
Abs Immature Granulocytes: 0.08 10*3/uL — ABNORMAL HIGH (ref 0.00–0.07)
Basophils Absolute: 0.1 10*3/uL (ref 0.0–0.1)
Basophils Relative: 1 %
Eosinophils Absolute: 0.4 10*3/uL (ref 0.0–0.5)
Eosinophils Relative: 6 %
HCT: 35.7 % — ABNORMAL LOW (ref 36.0–46.0)
Hemoglobin: 12.1 g/dL (ref 12.0–15.0)
Immature Granulocytes: 1 %
Lymphocytes Relative: 25 %
Lymphs Abs: 1.6 10*3/uL (ref 0.7–4.0)
MCH: 31.3 pg (ref 26.0–34.0)
MCHC: 33.9 g/dL (ref 30.0–36.0)
MCV: 92.2 fL (ref 80.0–100.0)
Monocytes Absolute: 0.7 10*3/uL (ref 0.1–1.0)
Monocytes Relative: 12 %
Neutro Abs: 3.4 10*3/uL (ref 1.7–7.7)
Neutrophils Relative %: 55 %
Platelets: 218 10*3/uL (ref 150–400)
RBC: 3.87 MIL/uL (ref 3.87–5.11)
RDW: 12.5 % (ref 11.5–15.5)
WBC: 6.2 10*3/uL (ref 4.0–10.5)
nRBC: 0 % (ref 0.0–0.2)

## 2022-12-12 LAB — CULTURE, BLOOD (ROUTINE X 2)

## 2022-12-12 LAB — BASIC METABOLIC PANEL
Anion gap: 6 (ref 5–15)
BUN: 25 mg/dL — ABNORMAL HIGH (ref 8–23)
CO2: 20 mmol/L — ABNORMAL LOW (ref 22–32)
Calcium: 8.8 mg/dL — ABNORMAL LOW (ref 8.9–10.3)
Chloride: 112 mmol/L — ABNORMAL HIGH (ref 98–111)
Creatinine, Ser: 0.93 mg/dL (ref 0.44–1.00)
GFR, Estimated: >60 mL/min (ref >60)
Glucose, Bld: 94 mg/dL (ref 70–99)
Potassium: 3.2 mmol/L — ABNORMAL LOW (ref 3.5–5.1)
Sodium: 138 mmol/L (ref 135–145)

## 2022-12-12 LAB — MAGNESIUM: Magnesium: 1.9 mg/dL (ref 1.7–2.4)

## 2022-12-12 MED ORDER — POTASSIUM CHLORIDE CRYS ER 20 MEQ PO TBCR
40.0000 meq | EXTENDED_RELEASE_TABLET | Freq: Once | ORAL | Status: AC
  Administered 2022-12-12: 40 meq via ORAL
  Filled 2022-12-12: qty 2

## 2022-12-12 MED ORDER — AMLODIPINE BESYLATE 5 MG PO TABS
2.5000 mg | ORAL_TABLET | Freq: Every day | ORAL | Status: DC
  Administered 2022-12-12 – 2022-12-14 (×3): 2.5 mg via ORAL
  Filled 2022-12-12 (×4): qty 1

## 2022-12-12 NOTE — Progress Notes (Signed)
PROGRESS NOTE    Melissa Wolfe  WUJ:[id number] DOB: 1950/02/08 DOA: 12/09/2022 PCP: Creola Corn, MD    Brief Narrative:  [name] is a 72 year old female with past medical history of hypertension, hyperlipidemia, hypothyroidism  depression, alcohol abuse disorder hospital with altered mental status.  Of note patient was recently seen in the ED on 12/04/2022 for left hip pain and was prescribed prednisone.  Since that time she had been having worsening confusion and irritability.  There was some concern regarding patient consuming alcohol as well.  In the ED, patient was afebrile without any leukocytosis.  Urinalysis was negative.  Blood cultures were obtained.  Patient was then admitted hospital for further evaluation and treatment.    Assessment and Plan:  * Acute encephalopathy Unknown etiology.  Could be secondary to infectious/substance abuse/medication effect.  Patient is on Ativan and gabapentin.  Possibility of side effect with underlying decreased renal function.  Urine drug screen with benzos and THC.  Monitor for signs of alcohol withdrawal.  Noted to be on the floor on 12/19/2022.  EEG was done without any signs of seizure-like activity.  Appears to be more alert and awake today.   Fall on the floor. On 12/11/2022 no external  injury.  EEG without signs of seizure.  Has been seen by PT and recommended skilled nursing facility placement.  Positive blood culture Polymicrobial gram-positive species including Staph hominis, capitis, epidermidis from blood culture of 12/09/2022.Marland Kitchen Repeat blood culture on 12/ 9 negative so far.  Temperature max of 99 F.  Has been started on vancomycin and cefepime. Marland Kitchen  Awaiting further growth from other set of cultures of 12/09/2022 and for 48 hours from the repeat 12/10/2022 cultures.  If remaining negative, will discontinue vancomycin.  continue cefepime for the GNR coverage until further results available    Depression, major, recurrent, moderate  (HCC) Had some suicidal ideation 12/11/2022 with expression of wanting to jump off the bridge.  Currently on one-to-one sitter.  Still complains of some suicidal ideation.  States that she had many tragedies at home and does not want to live longer.  Continue Zoloft.  Will consult psychiatry.   CKD stage 3a, GFR 45-59 ml/min (HCC) Previous labs from 2016 with baseline creatinine around 1.2 and GFR ~ 40.  Creatinine at this time at 0.9.   Hypokalemia Mild.-Level of 3.2 today.  Will continue to replace orally.  Check BMP in AM.   Hypertension Had episode of hypotension so antihypertensives on hold.  Currently blood pressure is elevated.  Will reorder amlodipine low-dose.  Patient is also on clonidine, HCTZ, losartan at home.   Hypothyroidism - TSH suppressed, 0.031 with free T4 elevated at 1.5.  Synthroid on hold at this time.  Could resume with the lower dose by AM.   Alcohol use disorder. Uncertain at this time.  Will continue to monitor closely for withdrawals.  Debility, deconditioning.  Recent fall..  PT recommending skilled nursing facility placement.     DVT prophylaxis: enoxaparin (LOVENOX) injection 30 mg Start: 12/09/22 2200   Code Status:     Code Status: Full Code  Disposition: Uncertain at this time, likely to skilled nursing facility.  Status is: Inpatient Remains inpatient appropriate because: Suicidal ideation, IV antibiotic, pending clinical improvement,   Family Communication: None at bedside  Consultants:  Psychiatry  Procedures:  EEG  Antimicrobials:  Vancomycin and cefepime  Anti-infectives (From admission, onward)    Start     Dose/Rate Route Frequency Ordered Stop   12/11/22  2200  ceFEPIme (MAXIPIME) 2 g in sodium chloride 0.9 % 100 mL IVPB        2 g 200 mL/hr over 30 Minutes Intravenous Every 12 hours 12/11/22 1013     12/11/22 1000  vancomycin (VANCOREADY) IVPB 500 mg/100 mL        500 mg 100 mL/hr over 60 Minutes Intravenous Every 24 hours  12/10/22 0834     12/10/22 1000  vancomycin (VANCOCIN) IVPB 1000 mg/200 mL premix        1,000 mg 200 mL/hr over 60 Minutes Intravenous  Once 12/10/22 0827 12/10/22 1101   12/10/22 1000  ceFEPIme (MAXIPIME) 2 g in sodium chloride 0.9 % 100 mL IVPB  Status:  Discontinued        2 g 200 mL/hr over 30 Minutes Intravenous Every 24 hours 12/10/22 0827 12/11/22 1013        Subjective: Today, patient was seen and examined at bedside.  Patient denies any headache, nausea, vomiting, fever, chills or rigor.  States that she cannot walk and crawls on her knees.  Stated that she was in the hospital.  Feels that she does not want to live longer due to multiple family issues.  Objective: Vitals:   12/11/22 1201 12/11/22 1613 12/11/22 2357 12/12/22 0624  BP: (!) 143/79 134/86 (!) 161/76 (!) 161/90  Pulse: 96 90 73 87  Resp: 16 16 19 17   Temp: 98 F (36.7 C) 99 F (37.2 C) 97.7 F (36.5 C) 98.8 F (37.1 C)  TempSrc: Oral Oral Oral Oral  SpO2: 95% 94% 97% 93%  Weight:      Height:        Intake/Output Summary (Last 24 hours) at 12/12/2022 1117 Last data filed at 12/12/2022 0800 Gross per 24 hour  Intake 560 ml  Output 1020 ml  Net -460 ml   Filed Weights   12/09/22 2258  Weight: 57.7 kg    Physical Examination: Body mass index is 25.69 kg/m.  General:  Average built, not in obvious distress, mildly interactive HENT:   No scleral pallor or icterus noted. Oral mucosa is moist.  Chest:  Clear breath sounds.  . No crackles or wheezes.  CVS: S1 &S2 heard. No murmur.  Regular rate and rhythm. Abdomen: Soft, nontender, nondistended.  Bowel sounds are heard.   Extremities: No cyanosis, clubbing or edema.  Peripheral pulses are palpable. Psych: Alert, awake and Communicative, oriented to to place and person. CNS:  No cranial nerve deficits.  Moves all extremities, generalized weakness noted. Skin: Warm and dry.  No rashes noted.  Data Reviewed:   CBC: Recent Labs  Lab  12/09/22 1347 12/10/22 0753 12/11/22 0802 12/12/22 0732  WBC 8.0 7.0 6.7 6.2  NEUTROABS 5.1  --  4.0 3.4  HGB 13.5 12.8 12.4 12.1  HCT 40.2 38.7 37.3 35.7*  MCV 93.5 94.4 93.5 92.2  PLT 235 243 214 218    Basic Metabolic Panel: Recent Labs  Lab 12/09/22 1347 12/10/22 0753 12/11/22 0802 12/12/22 0732  NA 140 139 141 138  K 3.4* 4.0 3.5 3.2*  CL 110 109 109 112*  CO2 23 22 22  20*  GLUCOSE 118* 101* 100* 94  BUN 48* 47* 37* 25*  CREATININE 1.32* 1.35* 1.15* 0.93  CALCIUM 9.2 8.8* 9.2 8.8*  MG  --  2.1 2.1 1.9  PHOS  --  2.9  --   --     Liver Function Tests: Recent Labs  Lab 12/09/22 1347  AST 25  ALT 19  ALKPHOS 92  BILITOT 1.0  PROT 6.8  ALBUMIN 3.5     Radiology Studies: EEG adult  Result Date: 12/15/22 Charlsie Quest, MD     12/15/2022  5:20 PM Patient Name: INDIRA WESBY MRN: [id number] Epilepsy Attending: Charlsie Quest Referring Physician/Provider: Lewie Chamber, MD Date: 12/15/22 Duration: 21.49 mins Patient history: 72yo F with ams getting eeg to evaluate for seizure Level of alertness: Awake AEDs during EEG study: None Technical aspects: This EEG study was done with scalp electrodes positioned according to the 10-20 International system of electrode placement. Electrical activity was reviewed with band pass filter of 1-70Hz , sensitivity of 7 uV/mm, display speed of 55mm/sec with a 60Hz  notched filter applied as appropriate. EEG data were recorded continuously and digitally stored.  Video monitoring was available and reviewed as appropriate. Description: The posterior dominant rhythm consists of 8 Hz activity of moderate voltage (25-35 uV) seen predominantly in posterior head regions, symmetric and reactive to eye opening and eye closing. Hyperventilation and photic stimulation were not performed.   Of note, study was technically difficult due to significant electrode and movement artifact IMPRESSION: This technically difficult study is within  normal limits. No seizures or epileptiform discharges were seen throughout the recording. A normal interictal EEG does not exclude the diagnosis of epilepsy. Priyanka Annabelle Harman      LOS: 2 days     Joycelyn Das, MD [facility] Available via Epic secure chat 7am-7pm After these hours, please refer to coverage provider listed on [email or web address] 12/12/2022, 11:17 AM

## 2022-12-12 NOTE — Evaluation (Signed)
Physical Therapy Evaluation Patient Details Name: Melissa Wolfe MRN: [id number] DOB: Jul 20, 1950 Today's Date: 12/12/2022  History of Present Illness  72 yo female presents to therapy following hospital admission on 12/09/2022 due to AMS x 4 days. Pt has hx of Etoch abuse since death of a child. Pt had presented to ED on 12/3 with L hip pain and d/c with prednisone. Recent UTI. Pt found to have acute encephalopathy, suicidal ideation and depression with fall on 12/9 OOB. Pt PMH includes but is not limited to: HTN, HLD, depression, sciatica, and substance abuse.  Clinical Impression     Pt admitted with above diagnosis.  Pt currently with functional limitations due to the deficits listed below (see PT Problem List). Pt in bed when PT arrived. Sitter present throughout intervention. Pt agreeable to therapy Eval. Pt required CGA for supine to sit with increased time and cues, min A for sit to supine for R LE, mod A for sit to stand  from EOB to RW, min A for standing balance and min A for side stepping to R toward HOB. Pt indicated some dizziness when seated EOB. Pt indicated that she wants to improve her B LE strength. Pt is a poor historian and PT unclear of PLOF. Patient will benefit from continued inpatient follow up therapy, <3 hours/day. Pt will benefit from acute skilled PT to increase their independence and safety with mobility to allow discharge.         If plan is discharge home, recommend the following: Two people to help with walking and/or transfers;A lot of help with bathing/dressing/bathroom;Assistance with cooking/housework;Assist for transportation;Help with stairs or ramp for entrance;Direct supervision/assist for financial management;Direct supervision/assist for medications management;Supervision due to cognitive status   Can travel by private vehicle   Yes    Equipment Recommendations Other (comment) (TBD at next venue and unclear of DME in home setting)  Recommendations  for Other Services       Functional Status Assessment Patient has had a recent decline in their functional status and demonstrates the ability to make significant improvements in function in a reasonable and predictable amount of time.     Precautions / Restrictions Precautions Precautions: Fall Restrictions Weight Bearing Restrictions: No      Mobility  Bed Mobility Overal bed mobility: Needs Assistance Bed Mobility: Supine to Sit, Sit to Supine     Supine to sit: Contact guard, HOB elevated Sit to supine: Min assist, HOB elevated   General bed mobility comments: pt requires increased time and multimodal cues with bed mobility tasks    Transfers Overall transfer level: Needs assistance Equipment used: Rolling walker (2 wheels) Transfers: Sit to/from Stand Sit to Stand: Mod assist           General transfer comment: min cues, pull to stand, assist with power up and initital standing balance with min A. pt indicated R posterior proximal LE pain, pt unable to rate on numeric scale. pt requesting to sit on EOB, pt reported dizziness once returned to EOB and subsided when in semi reclined in bed.    Ambulation/Gait               General Gait Details: pt able to particiapte with side stepping to the R toward HOB x 4 steps with multimodal cues and min A at [employer] Mobility     Tilt Bed    Modified Rankin (Stroke Patients Only)  Balance Overall balance assessment: Needs assistance, History of Falls Sitting-balance support: Feet supported Sitting balance-Leahy Scale: Fair   Postural control:  (trunk flexion) Standing balance support: Reliant on assistive device for balance, During functional activity, Bilateral upper extremity supported Standing balance-Leahy Scale: Zero                               Pertinent Vitals/Pain Pain Assessment Pain Assessment: Faces Faces Pain Scale: Hurts even more Pain  Location: R posterior proximal LE Pain Descriptors / Indicators: Discomfort, Grimacing, Guarding Pain Intervention(s): Monitored during session, Limited activity within patient's tolerance, Repositioned    Home Living Family/patient expects to be discharged to:: Private residence Living Arrangements: Parent;Other relatives Available Help at Discharge: Family Type of Home: House Home Access: Stairs to enter Entrance Stairs-Rails: Right;Left;Can reach both Entrance Stairs-Number of Steps: 4   Home Layout: One level   Additional Comments: pt reports she has tried RW and [facility] and nothing works    Prior Function Prior Level of Function : Independent/Modified Independent;History of Falls (last six months);Patient poor historian/Family not available (pt reports 7 falls in the past 6 months, most recent at hospital 12/9 OOB)             Mobility Comments: pt reports she crawls through the house on her knees due to her legs not working. No AD. ADLs Comments: pt reports that she is the primary caregiver for her mother whom has had 3 CVAs, pt states she is able to complete all ADLs, self care tasks, IADLs and driving.     Extremity/Trunk Assessment        Lower Extremity Assessment Lower Extremity Assessment: Generalized weakness    Cervical / Trunk Assessment Cervical / Trunk Assessment: Normal  Communication   Communication Communication: Difficulty following commands/understanding;Difficulty communicating thoughts/reduced clarity of speech Cueing Techniques: Verbal cues;Gestural cues;Tactile cues;Visual cues  Cognition Arousal: Alert Behavior During Therapy: Anxious, Impulsive Overall Cognitive Status: Impaired/Different from baseline Area of Impairment: Orientation, Attention, Memory, Following commands, Safety/judgement, Awareness, Problem solving                 Orientation Level: Time, Situation Current Attention Level: Focused Memory: Decreased recall of  precautions, Decreased short-term memory Following Commands: Follows one step commands consistently, Follows one step commands with increased time Safety/Judgement: Decreased awareness of safety   Problem Solving: Decreased initiation, Difficulty sequencing, Requires verbal cues, Requires tactile cues, Slow processing General Comments: unclear of pt cogniative status PLOF, pt has difficulty expressing herself and sequencing with communicating hx. pt indicated that she was unable to walk because of her legs and was crawling and then indicated she was able to drive and perform community ambulation without AD as well as take care of her 32 yo mother. pt appears to be a poor historian and no family present to provide insight        General Comments      Exercises     Assessment/Plan    PT Assessment Patient needs continued PT services  PT Problem List Decreased strength;Decreased range of motion;Decreased activity tolerance;Decreased balance;Decreased mobility;Decreased coordination;Pain       PT Treatment Interventions DME instruction;Gait training;Stair training;Functional mobility training;Therapeutic activities;Therapeutic exercise;Balance training;Neuromuscular re-education;Patient/family education    PT Goals (Current goals can be found in the Care Plan section)  Acute Rehab PT Goals PT Goal Formulation: Patient unable to participate in goal setting    Frequency Min 1X/week     Co-evaluation  AM-PAC PT "6 Clicks" Mobility  Outcome Measure Help needed turning from your back to your side while in a flat bed without using bedrails?: A Little Help needed moving from lying on your back to sitting on the side of a flat bed without using bedrails?: A Little Help needed moving to and from a bed to a chair (including a wheelchair)?: A Little Help needed standing up from a chair using your arms (e.g., wheelchair or bedside chair)?: A Lot Help needed to walk in  hospital room?: Total Help needed climbing 3-5 steps with a railing? : Total 6 Click Score: 13    End of Session Equipment Utilized During Treatment: Gait belt Activity Tolerance: Patient limited by pain;Treatment limited secondary to medical complications (Comment);Patient limited by fatigue (dizziness) Patient left: in bed;with call bell/phone within reach;with nursing/sitter in room Nurse Communication: Mobility status PT Visit Diagnosis: Unsteadiness on feet (R26.81);Muscle weakness (generalized) (M62.81);Repeated falls (R29.6);Difficulty in walking, not elsewhere classified (R26.2);Pain Pain - Right/Left: Right Pain - part of body: Leg    Time: 2841-3244 PT Time Calculation (min) (ACUTE ONLY): 23 min   Charges:   PT Evaluation $PT Eval Low Complexity: 1 Low PT Treatments $Therapeutic Activity: 8-22 mins PT General Charges $$ ACUTE PT VISIT: 1 Visit         Johnny Bridge, PT Acute Rehab   Jacqualyn Posey 12/12/2022, 10:55 AM

## 2022-12-12 NOTE — TOC Progression Note (Addendum)
Transition of Care [facility]) - Progression Note    Patient Details  Name: Melissa Wolfe MRN: [id number] Date of Birth: [birth year]  Transition [facility]) CM/SW Contact  Beckie Busing, RN Phone Number:[phone]  12/12/2022, 3:55 PM  Clinical Narrative:    [facility] acknowledges consult for substance abuse resources. Patient currently is oriented to name only ans does not respond appropriately to screen. TOC will continue to follow.         Expected Discharge Plan and Services                                               Social Determinants of Health (SDOH) Interventions SDOH Screenings   Food Insecurity: No Food Insecurity (12/09/2022)  Transportation Needs: No Transportation Needs (12/09/2022)  Utilities: Not At Risk (12/09/2022)  Social Connections: Unknown (11/21/2021)   Received from [facility], [facility]  Tobacco Use: High Risk (12/09/2022)    Readmission Risk Interventions     No data to display

## 2022-12-12 NOTE — Consult Note (Signed)
Melissa Wolfe Psychiatry Consult Evaluation  Service Date: December 12, 2022 LOS:  LOS: 2 days    Primary Psychiatric Diagnoses  Major Depression Disorder 2.  Adjustment Disorder 3.  Bereavement  Assessment  Melissa Wolfe is a 72 y.o. female admitted medically for 12/09/2022  1:14 PM for weakness, encephalopathy, and falls. They have a history of HTN, HLD, depression, alcohol use disorder reports last drink about 4months ago. SHe was admitted with altered mental status after being found in the floor. Inpatient work up thus far shows negative EEG, + GNR currently taking cefepime. During the hospitalization she endorses suicidal ideations to nursing staff therefore psychiatric consult was placed.   Her current presentation of worsening depressive symptoms, hopelessness, worthlessness, guilty, isolation, suicidal thoughts, and anhedonia is most consistent with MDD, severe. She meets criteria for MDD based on the above symptoms that have been ongoing > 2 weeks. She reports her suicidal thoughts started immediately after the passing of her daughter in February in 2024. She is taking outpatient medications that include Gabapentin and Ativan, UDS positive for BZD.  She reports taking an antidepressant, however chart review does not reflect history. On initial examination, patient is alert and oriented x 4, calm and cooperative, attentive, and engages well with this provider. She reports worsening depressive symptoms since the untimely suicide of her daughter. She expresses that her daughter was her only child, and left a grandson age 40 who resides with his father. She reports she is the primary caretaker of her mother age 38, and those are the only reason she has not attempted. She states " I don't know how I am going to go on life without her. " She states her suicidal thoughts were passive until about 3 months ago when she actual developed a plan to jump off a bridge. Prior to her daughters death she  denies any psychiatric history, inpatient psychiatric admissions, outpatient services or psychotropic medications. She denies any grief or counseling services following her death. She denies any history of suicide attempts, non suicidal self injurious behaviors. Please see plan below for detailed recommendations.   Diagnoses:  Active Hospital problems: Principal Problem:   Acute encephalopathy Active Problems:   Alcohol dependence (HCC)   Depression, major, recurrent, moderate (HCC)   Hypothyroid   Hypertension   CKD stage 3a, GFR 45-59 ml/min (HCC)   Hypokalemia   Positive blood culture   Fall     Plan   ## Psychiatric Medication Recommendations:  -- Continue prns at this time. Patient will benefit from antidepressant and possibly an adjunct medication (Abilify or venlafaxine).   Currently receiving cefepime- if her mental status continues to wax and wane recommend use of another abx as this cause cause worsening neuropsychiatric symptoms.   ## Medical Decision Making Capacity:  Not formally assessed but appears to have capacity. There does not appear to be any acute psychiatric condition or encephalopathy that will prohibit decision making at this time.   ## Further Work-up:  --  TSH, B12, folate, EKG, or While pt on Qtc prolonging medications, please monitor & replete K+ to 4 and Mg2+ to 2   -- most recent EKG on 12/8 had QtC of 466 -- Pertinent labwork reviewed earlier this admission includes: Hypokalemia 3.2, TSH 0.031, T3 1.7, Free T4 1.55, Urinalysis Small leukocytes. UDS positive for BZD and THC.   -Recommend PT/OT to increase physical strength and prevent further deconditioning Will defer hyperthyroidism to primary team- agree with rec to hold Synthroid.  ##  Disposition:  -- We recommend inpatient psychiatric hospitalization when medically cleared. Patient is under voluntary admission status at this time; please IVC if attempts to leave hospital.  ## Behavioral /  Environmental:  --  Utilize compassion and acknowledge the patient's experiences while setting clear and realistic expectations for care.   ## Safety and Observation Level:  - Based on my clinical evaluation, I estimate the patient to be at moderate risk of self harm in the current setting.  - At this time, we recommend a 1:1 level of observation. This decision is based on my review of the chart including patient's history and current presentation, interview of the patient, mental status examination, and consideration of suicide risk including evaluating suicidal ideation, plan, intent, suicidal or self-harm behaviors, risk factors, and protective factors. This judgment is based on our ability to directly address suicide risk, implement suicide prevention strategies and develop a safety plan while the patient is in the clinical setting. Please contact our team if there is a concern that risk level has changed.  Suicide risk assessment  Patient has following modifiable risk factors for suicide: active suicidal ideation, untreated depression, social isolation, and lack of access to outpatient mental health resources, which we are addressing by inpatient psychiatric admission, medication management once medically stable, 1:1 safety sitter.   Patient has following non-modifiable or demographic risk factors for suicide: family hx of suicide completion.   Patient has the following protective factors against suicide: Access to outpatient mental health care, Supportive family, Supportive friends, Cultural, spiritual, or religious beliefs that discourage suicide, Frustration tolerance, no history of suicide attempts, and no history of NSSIB Grandson (16). Willing to seek inpatient treatment.    Thank you for this consult request. Recommendations have been communicated to the primary team.  We will continue to follow at this time.   Maryagnes Amos, FNP  Psychiatric and Social History   Relevant  Aspects of Hospital Course:  Admitted on 12/09/2022 for weakness, encephalopathy and falls. They have a history of HTN, HLD, depression, alcohol use disorder reports last drink about 4 months ago. She was admitted with altered mental status after being found in the floor. Inpatient work up thus far shows negative EEG, + GNR currently taking cefepime. During the hospitalization she endorses suicidal ideations to nursing staff therefore psychiatric consult was placed.    Patient Report: She denies any acute symptoms of mania at this time to include impulsivity, grandiosity, mood lability, hypersexuality.  She does not present with any of the above symptoms on this evaluation.  She has multiple depressive symptoms to include anhedonia, hopelessness, worthlessness, guilty, suicidal, hypersomnia, recurrent thoughts of death, anxiety.  She denies any acute psychosis, paranoia.  She does not appear to be displaying any or responding to internal stimuli, external stimuli, or exhibiting delusional thought disorder.  Patient denies any access to weapons, denies any alcohol and or substance abuse(UDS positive for THC).  She reports broken and disturbed sleep but sleeps over 16 hours a day " I wish I could sleep more." She reports a poor appetite. She reports due to her pain, she has been limited to driving and hasn't been able to see her grandson as much because he is not driving. She would like to be more involved in his life, and she became very tearful after this statement. While she is endorsing her love for her family and pain from her daughters death, she is hopeful and wants to live. She wants to be able to  take care of her mother and grandson. She is able to verbalize that suicide completion is a risk factor which also places her grandson at risk. She is initially hesitant on inpatient recommendation, but has enough insight and judgement to understand she needs it to get better. She states that she must be physically  and mentally strong in order to take care of anyone else. Patient denies any auditory and/or visual hallucinations, does not appear to be responding to internal or external stimuli.  There is no evidence of delusional thought content and patient appears to answer all questions appropriately. At this time patient remains under medical team, once stable she will need inpatient psychiatric admission. In the event she attempts to leave she currently meets criteria for IVC, as she is unable to contract for safety " I guess I will say something." She has agreed to go voluntary at this time, will try to secure placement in [place] (close to home).      Psych ROS:  Depression: Worthless, hopeless, guilty, suicidal thoughts, anhedonia, hypersomnia, poor appetite, weight loss, sadness, decreased concentration, recurrent thoughts of death.  Anxiety:  excessive worrying and nervousness Mania (lifetime and current): easily agitated and irritable, compulsive spending and impulse buying. Denies any mania, grandiosity, pressured speech.  Psychosis: (lifetime and current): Denies  Collateral information:  Collateral not obtained at this time. Patient good historian, openly admitting suicidal thoughts with intent and willing to come into the hospital. Will need eventually but will defer at this time.   Psychiatric History:  Information collected from patient and chart reviewed.   Prev Dx/Sx: Major Depressive Disorder diagnosed after daughter completed.  Current Psych Provider: N/a Home Meds (current): N/a Previous Med Trials: Reports taking an antidepressant unable to recall, chart review does not reflect recent or previous use of antidepressant.  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  Spiritual Hx: non denominational Access to weapons: Denies  Substance History Tobacco use: Denies Alcohol use: Hx of alcohol use disorder. Tells me her last drink about 4 months ago.  Drug use: Denies, UDS positive for THC and BZD(active rx).    Exam Findings   Psychiatric Specialty Exam:  Presentation  General Appearance: Appropriate for Environment; Casual  Eye Contact:Good  Speech:Clear and Coherent; Normal Rate  Speech Volume:Normal  Handedness:Right   Mood and Affect  Mood:Dysphoric; Depressed  Affect:Depressed; Constricted (tearful at times)   Thought Process  Thought Processes:Coherent; Linear  Descriptions of Associations:Intact  Orientation:Full (Time, Place and Person)  Thought Content:WDL  Hallucinations:Hallucinations: None  Ideas of Reference:None  Suicidal Thoughts:Suicidal Thoughts: Yes, Passive SI Passive Intent and/or Plan: With Intent; Without Plan; With Means to Carry Out; With Access to Means  Homicidal Thoughts:Homicidal Thoughts: No   Sensorium  Memory:Recent Good; Remote Good; Immediate Good  Judgment:Fair  Insight:Fair   Executive Functions  Concentration:Good  Attention Span:Good  Recall:Good  Fund of Knowledge:Good  Language:Good   Psychomotor Activity  Psychomotor Activity:Psychomotor Activity: Normal   Assets  Assets:Communication Skills; Desire for Improvement; Financial Resources/Insurance; Housing; Leisure Time; Physical Health; Resilience; Social Support; Talents/Skills; Vocational/Educational; Transportation   Sleep  Sleep:Sleep: Good Number of Hours of Sleep: 16    Physical Exam: Vital signs:  Temp:  [97.7  F (36.5 C)-99 F (37.2 C)] 98.3 F (36.8 C) (12/11 1323) Pulse Rate:  [73-90] 87 (12/11 1323) Resp:  [16-19] 16 (12/11 1323) BP: (134-161)/(76-92) 151/92 (12/11  1323) SpO2:  [93 %-97 %] 93 % (12/11 1323) Physical Exam  Blood pressure (!) 151/92, pulse 87, temperature 98.3 F (36.8 C), temperature source Oral, resp. rate 16, height 4\' 11"  (1.499 m), weight 57.7 kg, SpO2 93%. Body mass index is 25.69 kg/m.   Other History   These have been pulled in through the EMR, reviewed, and updated if appropriate.   Family History:  Daughter completed in Feb 2024 via intentional drug overdose. Per patient she expressed her intent to die.  The patient's family history is not on file.  Medical History: Past Medical History:  Diagnosis Date   Alcoholism (HCC)    Allergy    Depression    Hyperlipemia    Hypertension    Hypothyroid     Surgical History: Past Surgical History:  Procedure Laterality Date   BREAST EXCISIONAL BIOPSY Left 2008   benign   COLONOSCOPY     fallopian ablation     HAND SURGERY Left 05/2015   MYOMECTOMY     x2   OOPHORECTOMY Right    POLYPECTOMY      Medications:   Current Facility-Administered Medications:    acetaminophen (TYLENOL) tablet 650 mg, 650 mg, Oral, Q6H PRN **OR** acetaminophen (TYLENOL) suppository 650 mg, 650 mg, Rectal, Q6H PRN, Opyd, Lavone Neri, MD   amLODipine (NORVASC) tablet 2.5 mg, 2.5 mg, Oral, Daily, Pokhrel, Laxman, MD, 2.5 mg at 12/12/22 0936   ceFEPIme (MAXIPIME) 2 g in sodium chloride 0.9 % 100 mL IVPB, 2 g, Intravenous, Q12H, Lewie Chamber, MD, Last Rate: 200 mL/hr at 12/12/22 1046, 2 g at 12/12/22 1046   enoxaparin (LOVENOX) injection 30 mg, 30 mg, Subcutaneous, Q24H, Opyd, Lavone Neri, MD, 30 mg at 12/11/22 2151   haloperidol lactate (HALDOL) injection 5 mg, 5 mg, Intravenous, Q6H PRN, Lewie Chamber, MD, 5 mg at 12/11/22 1632   ondansetron (ZOFRAN) tablet 4 mg, 4 mg, Oral, Q6H PRN **OR** ondansetron (ZOFRAN) injection 4 mg, 4 mg, Intravenous, Q6H PRN, Opyd, Lavone Neri, MD   Oral care mouth rinse, 15 mL, Mouth Rinse, PRN, Lewie Chamber, MD   senna-docusate (Senokot-S) tablet 1 tablet, 1  tablet, Oral, QHS PRN, Opyd, Lavone Neri, MD   vancomycin (VANCOREADY) IVPB 500 mg/100 mL, 500 mg, Intravenous, Q24H, Lewie Chamber, MD, Last Rate: 100 mL/hr at 12/12/22 1044, 500 mg at 12/12/22 1044  Allergies: Allergies  Allergen Reactions   Cephalexin Rash   Codeine Anxiety and Other (See Comments)    Hallucinations and jittery  Hallucinations and jittery  Pt states "anxiety and out of body experience"

## 2022-12-13 DIAGNOSIS — F331 Major depressive disorder, recurrent, moderate: Secondary | ICD-10-CM | POA: Diagnosis not present

## 2022-12-13 DIAGNOSIS — W19XXXA Unspecified fall, initial encounter: Secondary | ICD-10-CM | POA: Diagnosis not present

## 2022-12-13 DIAGNOSIS — G9340 Encephalopathy, unspecified: Secondary | ICD-10-CM | POA: Diagnosis not present

## 2022-12-13 DIAGNOSIS — R7881 Bacteremia: Secondary | ICD-10-CM | POA: Diagnosis not present

## 2022-12-13 LAB — CBC WITH DIFFERENTIAL/PLATELET
Abs Immature Granulocytes: 0.11 10*3/uL — ABNORMAL HIGH (ref 0.00–0.07)
Basophils Absolute: 0.1 10*3/uL (ref 0.0–0.1)
Basophils Relative: 1 %
Eosinophils Absolute: 0.4 10*3/uL (ref 0.0–0.5)
Eosinophils Relative: 5 %
HCT: 37.5 % (ref 36.0–46.0)
Hemoglobin: 12.6 g/dL (ref 12.0–15.0)
Immature Granulocytes: 2 %
Lymphocytes Relative: 24 %
Lymphs Abs: 1.7 10*3/uL (ref 0.7–4.0)
MCH: 31.1 pg (ref 26.0–34.0)
MCHC: 33.6 g/dL (ref 30.0–36.0)
MCV: 92.6 fL (ref 80.0–100.0)
Monocytes Absolute: 0.7 10*3/uL (ref 0.1–1.0)
Monocytes Relative: 10 %
Neutro Abs: 4.1 10*3/uL (ref 1.7–7.7)
Neutrophils Relative %: 58 %
Platelets: 228 10*3/uL (ref 150–400)
RBC: 4.05 MIL/uL (ref 3.87–5.11)
RDW: 12.3 % (ref 11.5–15.5)
WBC: 7.1 10*3/uL (ref 4.0–10.5)
nRBC: 0 % (ref 0.0–0.2)

## 2022-12-13 LAB — BASIC METABOLIC PANEL
Anion gap: 7 (ref 5–15)
BUN: 18 mg/dL (ref 8–23)
CO2: 22 mmol/L (ref 22–32)
Calcium: 9 mg/dL (ref 8.9–10.3)
Chloride: 109 mmol/L (ref 98–111)
Creatinine, Ser: 0.75 mg/dL (ref 0.44–1.00)
GFR, Estimated: >60 mL/min (ref >60)
Glucose, Bld: 109 mg/dL — ABNORMAL HIGH (ref 70–99)
Potassium: 3.7 mmol/L (ref 3.5–5.1)
Sodium: 138 mmol/L (ref 135–145)

## 2022-12-13 LAB — VITAMIN B1: Vitamin B1 (Thiamine): 97 nmol/L (ref 66.5–200.0)

## 2022-12-13 LAB — SARS CORONAVIRUS 2 BY RT PCR: SARS Coronavirus 2 by RT PCR: NEGATIVE

## 2022-12-13 LAB — MAGNESIUM: Magnesium: 1.9 mg/dL (ref 1.7–2.4)

## 2022-12-13 MED ORDER — ACETAMINOPHEN 325 MG PO TABS: 650.0000 mg | ORAL_TABLET | Freq: Four times a day (QID) | ORAL | Status: AC | PRN

## 2022-12-13 MED ORDER — FOLIC ACID 1 MG PO TABS: 1.0000 mg | ORAL_TABLET | Freq: Every day | ORAL | Status: AC

## 2022-12-13 MED ORDER — ONE-DAILY MULTI VITAMINS PO TABS: 1.0000 | ORAL_TABLET | Freq: Every day | ORAL | Status: AC

## 2022-12-13 MED ORDER — LORAZEPAM 1 MG PO TABS: 1.0000 mg | ORAL_TABLET | Freq: Four times a day (QID) | ORAL | Status: AC | PRN

## 2022-12-13 MED ORDER — THIAMINE HCL 100 MG PO TABS
100.0000 mg | ORAL_TABLET | Freq: Every day | ORAL | Status: AC
Start: 2022-12-13 — End: ?

## 2022-12-13 MED ORDER — TRAZODONE HCL 50 MG PO TABS
25.0000 mg | ORAL_TABLET | Freq: Every evening | ORAL | Status: AC | PRN
Start: 2022-12-13 — End: 2022-12-15
  Administered 2022-12-13 – 2022-12-15 (×3): 25 mg via ORAL
  Filled 2022-12-13 (×3): qty 1

## 2022-12-13 MED ORDER — SENNOSIDES-DOCUSATE SODIUM 8.6-50 MG PO TABS: 1.0000 | ORAL_TABLET | Freq: Every evening | ORAL | Status: AC | PRN

## 2022-12-13 MED ORDER — LORAZEPAM 1 MG PO TABS: 1.0000 mg | ORAL_TABLET | Freq: Four times a day (QID) | ORAL | Status: DC | PRN

## 2022-12-13 MED ORDER — ENOXAPARIN SODIUM 40 MG/0.4ML IJ SOSY
40.0000 mg | PREFILLED_SYRINGE | INTRAMUSCULAR | Status: DC
  Administered 2022-12-13 – 2022-12-16 (×4): 40 mg via SUBCUTANEOUS
  Filled 2022-12-13 (×4): qty 0.4

## 2022-12-13 NOTE — Progress Notes (Signed)
Mobility Specialist - Progress Note   12/13/22 1018  Mobility  Activity Stood at bedside  Level of Assistance Contact guard assist, steadying assist  Assistive Device Front wheel walker  Activity Response Tolerated fair  Mobility Referral Yes  Mobility visit 1 Mobility  Mobility Specialist Start Time (ACUTE ONLY) 1002  Mobility Specialist Stop Time (ACUTE ONLY) 1017  Mobility Specialist Time Calculation (min) (ACUTE ONLY) 15 min   Pt received in bed and agreeable to mobility. Once standing & after taking 3-4 steps pt c/o feeling dizzy. Instructed pt to take seated rest break. Pt still dizzy after getting up the second time. Assisted pt with getting back into bed. No other complaints during session. Pt to bed after session with all needs met. Bed alarm on. Sitter in room.   [facility]

## 2022-12-13 NOTE — Plan of Care (Signed)
  Problem: Education: Goal: Knowledge of General Education information will improve Description Including pain rating scale, medication(s)/side effects and non-pharmacologic comfort measures Outcome: Progressing   

## 2022-12-13 NOTE — Plan of Care (Signed)
  Problem: Education: Goal: Knowledge of General Education information will improve Description: Including pain rating scale, medication(s)/side effects and non-pharmacologic comfort measures Outcome: Progressing   Problem: Health Behavior/Discharge Planning: Goal: Ability to manage health-related needs will improve Outcome: Progressing   Problem: Clinical Measurements: Goal: Ability to maintain clinical measurements within normal limits will improve Outcome: Progressing Goal: Will remain free from infection Outcome: Progressing Goal: Diagnostic test results will improve Outcome: Progressing Goal: Respiratory complications will improve Outcome: Progressing Goal: Cardiovascular complication will be avoided Outcome: Progressing   Problem: Activity: Goal: Risk for activity intolerance will decrease Outcome: Progressing   Problem: Nutrition: Goal: Adequate nutrition will be maintained Outcome: Progressing   Problem: Elimination: Goal: Will not experience complications related to bowel motility Outcome: Progressing Goal: Will not experience complications related to urinary retention Outcome: Progressing   Problem: Safety: Goal: Ability to remain free from injury will improve Outcome: Progressing   Problem: Skin Integrity: Goal: Risk for impaired skin integrity will decrease Outcome: Progressing   Problem: Safety: Goal: Non-violent Restraint(s) Outcome: Progressing

## 2022-12-13 NOTE — Discharge Summary (Signed)
Physician Discharge Summary  Melissa Wolfe WNU:[id number] DOB: 1950-06-01 DOA: 12/09/2022  PCP: Creola Corn, MD  Admit date: 12/09/2022 Discharge date: 12/13/2022  Admitted From: Home  Discharge disposition: [facility] psych unit   Recommendations for Outpatient Follow-Up:   Follow up with your primary care provider after discharge.  Follow-up with psychiatry on discharge.  Discharge Diagnosis:   Principal Problem:   Acute encephalopathy Active Problems:   Depression, major, recurrent, moderate (HCC)   Positive blood culture   Fall   CKD stage 3a, GFR 45-59 ml/min (HCC)   Alcohol dependence (HCC)   Hypothyroid   Hypertension   Hypokalemia   Discharge Condition: Improved.  Diet recommendation:   Regular.  Wound care: None.  Code status: Full.   History of Present Illness:   Melissa Wolfe is a 72 year old female with past medical history of hypertension, hyperlipidemia, hypothyroidism  depression, alcohol abuse disorder Wolfe with altered mental status.  Of note patient was recently seen in the ED on 12/04/2022 for left hip pain and was prescribed prednisone.  Since that time she had been having worsening confusion and irritability.  There was some concern regarding patient consuming alcohol as well.  In the ED, patient was afebrile without any leukocytosis.  Urinalysis was negative.  Blood cultures were obtained.  Patient was then admitted Wolfe for further evaluation and treatment.     Wolfe Course:   Following conditions were addressed during hospitalization as listed below,  Acute toxic metabolic encephalopathy Unknown etiology.  Could be secondary to infectious/substance abuse/medication effect.  Patient was on Ativan and gabapentin. Possibility of side effect with underlying decreased renal function.   Gabapentin has been discontinued at this time.  Ativan as needed has been continued.  Melissa Wolfe drug screen with benzos and THC.  He is more alert awake and  communicative today.   Fall on the floor. On 12/11/2022 no external  injury.  EEG without signs of seizure.     Positive blood culture Polymicrobial gram-positive species including Staph hominis, capitis, epidermidis, gram-negative bacteria.  From blood culture of 12/09/2022.Marland Kitchen Repeat blood culture on 12/ 9 negative so far.  Communicated with Dr. Thedore Mins infectious disease.  Very likely to be a contaminant.  Patient was on broad-spectrum antibiotic with vancomycin cefepime.  This will be discontinued at this time.     Depression, major, recurrent, moderate  Had some suicidal ideation 12/11/2022 with expression of wanting to jump off the bridge.  Currently on one-to-one sitter.  Still complains of some suicidal ideation.  Psychiatry on board and plan for [facility] psych unit admission.   CKD stage 3a, GFR 45-59 ml/min (HCC) Previous labs from 2016 with baseline creatinine around 1.2 and GFR ~ 40.  Creatinine at this time at 0.9.   Hypokalemia Improved after replacement.  Latest potassium of 3.7.  Magnesium of 1.9.   Hypertension Continue amlodipine.  Patient is also on clonidine, HCTZ, losartan at home.  HCTZ will be discontinued at discharge.   Hypothyroidism - TSH suppressed, 0.031 with free T4 elevated at 1.5.  Synthroid on hold at this time.  Reassess thyroid function again before reinitiating.  Alcohol use disorder. Uncertain at this time.  Will continue to monitor closely for withdrawals.  Will put the patient on as needed Ativan on discharge.   Debility, deconditioning.  Recent fall..  PT recommending skilled nursing facility placement.    Disposition.  At this time, patient is stable for disposition to [facility]fe psych unit.  Medical Consultants:   Psychiatry  Procedures:    EEG Subjective:   Today, patient was seen and examined at bedside.  Denies interval complaints.  No nausea vomiting fever chills or rigor.  Secondary school teacher at bedside.  Discharge Exam:   Vitals:   12/12/22  1950 12/13/22 0547  BP: (!) 140/77 (!) 167/91  Pulse: 93 72  Resp: 18 17  Temp: 99.6 F (37.6 C) 99.3 F (37.4 C)  SpO2: 99% 99%   Vitals:   12/12/22 0624 12/12/22 1323 12/12/22 1950 12/13/22 0547  BP: (!) 161/90 (!) 151/92 (!) 140/77 (!) 167/91  Pulse: 87 87 93 72  Resp: 17 16 18 17   Temp: 98.8 F (37.1 C) 98.3 F (36.8 C) 99.6 F (37.6 C) 99.3 F (37.4 C)  TempSrc: Oral Oral Oral Oral  SpO2: 93% 93% 99% 99%  Weight:      Height:       Body mass index is 25.69 kg/m.  General: Alert awake, not in obvious distress HENT: pupils equally reacting to light,  No scleral pallor or icterus noted. Oral mucosa is moist.  Chest:  Clear breath sounds.   No crackles or wheezes.  CVS: S1 &S2 heard. No murmur.  Regular rate and rhythm. Abdomen: Soft, nontender, nondistended.  Bowel sounds are heard.   Extremities: No cyanosis, clubbing or edema.  Peripheral pulses are palpable. Psych: Alert, awake and Communicative. CNS:  No cranial nerve deficits.  Generalized weakness noted. Skin: Warm and dry.  No rashes noted.  The results of significant diagnostics from this hospitalization (including imaging, microbiology, ancillary and laboratory) are listed below for reference.     Diagnostic Studies:   EEG adult Result Date: 12/11/2022 Melissa Quest, MD     12/11/2022  5:20 PM Patient Name: Melissa Wolfe MRN: [id number] Epilepsy Attending: Charlsie Wolfe Referring Physician/Provider: Lewie Chamber, MD Date: 12/11/2022 Duration: 21.49 mins Patient history: 72yo F with ams getting eeg to evaluate for seizure Level of alertness: Awake AEDs during EEG study: None Technical aspects: This EEG study was done with scalp electrodes positioned according to the 10-20 International system of electrode placement. Electrical activity was reviewed with band pass filter of 1-70Hz , sensitivity of 7 uV/mm, display speed of 23mm/sec with a 60Hz  notched filter applied as appropriate. EEG data were  recorded continuously and digitally stored.  Video monitoring was available and reviewed as appropriate. Description: The posterior dominant rhythm consists of 8 Hz activity of moderate voltage (25-35 uV) seen predominantly in posterior head regions, symmetric and reactive to eye opening and eye closing. Hyperventilation and photic stimulation were not performed.   Of note, study was technically difficult due to significant electrode and movement artifact IMPRESSION: This technically difficult study is within normal limits. No seizures or epileptiform discharges were seen throughout the recording. A normal interictal EEG does not exclude the diagnosis of epilepsy. Melissa Wolfe     Labs:   Basic Metabolic Panel: Recent Labs  Lab 12/09/22 1347 12/10/22 0753 12/11/22 0802 12/12/22 0732 12/13/22 0602  NA 140 139 141 138 138  K 3.4* 4.0 3.5 3.2* 3.7  CL 110 109 109 112* 109  CO2 23 22 22  20* 22  GLUCOSE 118* 101* 100* 94 109*  BUN 48* 47* 37* 25* 18  CREATININE 1.32* 1.35* 1.15* 0.93 0.75  CALCIUM 9.2 8.8* 9.2 8.8* 9.0  MG  --  2.1 2.1 1.9 1.9  PHOS  --  2.9  --   --   --    GFR Estimated Creatinine Clearance:  47.7 mL/min (by C-G formula based on SCr of 0.75 mg/dL). Liver Function Tests: Recent Labs  Lab 12/09/22 1347  AST 25  ALT 19  ALKPHOS 92  BILITOT 1.0  PROT 6.8  ALBUMIN 3.5   Recent Labs  Lab 12/09/22 1347  LIPASE 26   Recent Labs  Lab 12/10/22 0753  AMMONIA 13   Coagulation profile No results for input(s): "INR", "PROTIME" in the last 168 hours.  CBC: Recent Labs  Lab 12/09/22 1347 12/10/22 0753 12/11/22 0802 12/12/22 0732 12/13/22 0602  WBC 8.0 7.0 6.7 6.2 7.1  NEUTROABS 5.1  --  4.0 3.4 4.1  HGB 13.5 12.8 12.4 12.1 12.6  HCT 40.2 38.7 37.3 35.7* 37.5  MCV 93.5 94.4 93.5 92.2 92.6  PLT 235 243 214 218 228   Cardiac Enzymes: No results for input(s): "CKTOTAL", "CKMB", "CKMBINDEX", "TROPONINI" in the last 168 hours. BNP: Invalid input(s):  "POCBNP" CBG: No results for input(s): "GLUCAP" in the last 168 hours. D-Dimer No results for input(s): "DDIMER" in the last 72 hours. Hgb A1c No results for input(s): "HGBA1C" in the last 72 hours. Lipid Profile No results for input(s): "CHOL", "HDL", "LDLCALC", "TRIG", "CHOLHDL", "LDLDIRECT" in the last 72 hours. Thyroid function studies No results for input(s): "TSH", "T4TOTAL", "T3FREE", "THYROIDAB" in the last 72 hours.  Invalid input(s): "FREET3" Anemia work up No results for input(s): "VITAMINB12", "FOLATE", "FERRITIN", "TIBC", "IRON", "RETICCTPCT" in the last 72 hours. Microbiology Recent Results (from the past 240 hours)  Blood culture (routine x 2)     Status: Abnormal   Collection Time: 12/09/22  2:05 PM   Specimen: BLOOD RIGHT FOREARM  Result Value Ref Range Status   Specimen Description   Final    BLOOD RIGHT FOREARM Performed at [facility], [address] [place], [place] [address]    Special Requests   Final    BOTTLES DRAWN AEROBIC AND ANAEROBIC Blood Culture results may not be optimal due to an inadequate volume of blood received in culture bottles Performed at [facility], [address], [place], [place] [address]    Culture  Setup Time   Final    GRAM POSITIVE COCCI IN BOTH AEROBIC AND ANAEROBIC BOTTLES CRITICAL VALUE NOTED.  VALUE IS CONSISTENT WITH PREVIOUSLY REPORTED AND CALLED VALUE.    Culture (A)  Final    STAPHYLOCOCCUS HOMINIS STAPHYLOCOCCUS CAPITIS STAPHYLOCOCCUS EPIDERMIDIS THE SIGNIFICANCE OF ISOLATING THIS ORGANISM FROM A SINGLE SET OF BLOOD CULTURES WHEN MULTIPLE SETS ARE DRAWN IS UNCERTAIN. PLEASE NOTIFY THE MICROBIOLOGY DEPARTMENT WITHIN ONE WEEK IF SPECIATION AND SENSITIVITIES ARE REQUIRED. Performed at [facility], [address] [place], [place] [address]    Report Status 12/12/2022 FINAL  Final  Blood culture (routine x 2)     Status: Abnormal   Collection Time: 12/09/22  2:10 PM   Specimen: BLOOD   Result Value Ref Range Status   Specimen Description   Final    BLOOD LEFT ANTECUBITAL Performed at [facility], [address], [place], [place] [address]    Special Requests   Final    BOTTLES DRAWN AEROBIC ONLY Blood Culture results may not be optimal due to an inadequate volume of blood received in culture bottles Performed at [facility], [address] [place], [place] [address]    Culture  Setup Time   Final    Romie Minus NEGATIVE RODS GRAM POSITIVE COCCI AEROBIC BOTTLE ONLY CRITICAL RESULT CALLED TO, READ BACK BY AND VERIFIED  WITH: L POINDEXTER,PHARMD@0716  12/10/22 MK    Culture (A)  Final    PANTOEA SPECIES SCHISTOSOMA HAEMATOBIUM THE SIGNIFICANCE OF ISOLATING THIS ORGANISM FROM A SINGLE SET OF BLOOD CULTURES WHEN MULTIPLE SETS ARE DRAWN IS UNCERTAIN. PLEASE NOTIFY THE MICROBIOLOGY DEPARTMENT WITHIN ONE WEEK IF SPECIATION AND SENSITIVITIES ARE REQUIRED. Performed at [facility], [address], [place], [place] [address]    Report Status 12/12/2022 FINAL  Final   Organism ID, Bacteria PANTOEA SPECIES  Final      Susceptibility   Pantoea species - MIC*    CEFEPIME <=0.12 SENSITIVE Sensitive     CEFTAZIDIME <=1 SENSITIVE Sensitive     CEFTRIAXONE <=0.25 SENSITIVE Sensitive     CIPROFLOXACIN <=0.25 SENSITIVE Sensitive     GENTAMICIN <=1 SENSITIVE Sensitive     IMIPENEM <=0.25 SENSITIVE Sensitive     TRIMETH/SULFA <=20 SENSITIVE Sensitive     PIP/TAZO <=4 SENSITIVE Sensitive ug/mL    * PANTOEA SPECIES  Blood Culture ID Panel (Reflexed)     Status: Abnormal   Collection Time: 12/09/22  2:10 PM  Result Value Ref Range Status   Enterococcus faecalis NOT DETECTED NOT DETECTED Final   Enterococcus Faecium NOT DETECTED NOT DETECTED Final   Listeria monocytogenes NOT DETECTED NOT DETECTED Final   Staphylococcus species DETECTED (A) NOT DETECTED Final    Comment: CRITICAL RESULT CALLED TO, READ BACK BY AND VERIFIED WITH: L  POINDEXTER,PHARMD@0717  12/10/22 MK    Staphylococcus aureus (BCID) NOT DETECTED NOT DETECTED Final   Staphylococcus epidermidis DETECTED (A) NOT DETECTED Final    Comment: Methicillin (oxacillin) resistant coagulase negative staphylococcus. Possible blood culture contaminant (unless isolated from more than one blood culture draw or clinical case suggests pathogenicity). No antibiotic treatment is indicated for blood  culture contaminants. CRITICAL RESULT CALLED TO, READ BACK BY AND VERIFIED WITH: L POINDEXTER,PHARMD@0717  12/10/22 MK    Staphylococcus lugdunensis DETECTED (A) NOT DETECTED Final    Comment: Methicillin (oxacillin) resistant coagulase negative staphylococcus. Possible blood culture contaminant (unless isolated from more than one blood culture draw or clinical case suggests pathogenicity). No antibiotic treatment is indicated for blood  culture contaminants. CRITICAL RESULT CALLED TO, READ BACK BY AND VERIFIED WITH: L POINDEXTER,PHARMD@0717  12/10/22 MK    Streptococcus species NOT DETECTED NOT DETECTED Final   Streptococcus agalactiae NOT DETECTED NOT DETECTED Final   Streptococcus pneumoniae NOT DETECTED NOT DETECTED Final   Streptococcus pyogenes NOT DETECTED NOT DETECTED Final   A.calcoaceticus-baumannii NOT DETECTED NOT DETECTED Final   Bacteroides fragilis NOT DETECTED NOT DETECTED Final   Enterobacterales DETECTED (A) NOT DETECTED Final    Comment: Enterobacterales represent a large order of gram negative bacteria, not a single organism. Refer to culture for further identification. CRITICAL RESULT CALLED TO, READ BACK BY AND VERIFIED WITH: L POINDEXTER,PHARMD@0717  12/10/22 MK    Enterobacter cloacae complex NOT DETECTED NOT DETECTED Final   Escherichia coli NOT DETECTED NOT DETECTED Final   Klebsiella aerogenes NOT DETECTED NOT DETECTED Final   Klebsiella oxytoca NOT DETECTED NOT DETECTED Final   Klebsiella pneumoniae NOT DETECTED NOT DETECTED Final   Proteus species  NOT DETECTED NOT DETECTED Final   Salmonella species NOT DETECTED NOT DETECTED Final   Serratia marcescens NOT DETECTED NOT DETECTED Final   Haemophilus influenzae NOT DETECTED NOT DETECTED Final   Neisseria meningitidis NOT DETECTED NOT DETECTED Final   Pseudomonas aeruginosa NOT DETECTED NOT DETECTED Final   Stenotrophomonas maltophilia NOT DETECTED NOT DETECTED Final   Candida albicans NOT DETECTED NOT DETECTED Final  Candida auris NOT DETECTED NOT DETECTED Final   Candida glabrata NOT DETECTED NOT DETECTED Final   Candida krusei NOT DETECTED NOT DETECTED Final   Candida parapsilosis NOT DETECTED NOT DETECTED Final   Candida tropicalis NOT DETECTED NOT DETECTED Final   Cryptococcus neoformans/gattii NOT DETECTED NOT DETECTED Final   CTX-M ESBL NOT DETECTED NOT DETECTED Final   Carbapenem resistance IMP NOT DETECTED NOT DETECTED Final   Carbapenem resistance KPC NOT DETECTED NOT DETECTED Final   Methicillin resistance mecA/C DETECTED (A) NOT DETECTED Final    Comment: CRITICAL RESULT CALLED TO, READ BACK BY AND VERIFIED WITH: L POINDEXTER,PHARMD@0717  12/10/22 MK    Carbapenem resistance NDM NOT DETECTED NOT DETECTED Final   Carbapenem resist OXA 48 LIKE NOT DETECTED NOT DETECTED Final   Carbapenem resistance VIM NOT DETECTED NOT DETECTED Final    Comment: Performed at [facility], [address], [place], [place] [address]  Culture, blood (Routine X 2) w Reflex to ID Panel     Status: None (Preliminary result)   Collection Time: 12/10/22  8:47 AM   Specimen: BLOOD  Result Value Ref Range Status   Specimen Description   Final    BLOOD SITE NOT SPECIFIED Performed at [facility], [address], [place], [place] [address]    Special Requests   Final    BOTTLES DRAWN AEROBIC AND ANAEROBIC Blood Culture adequate volume Performed at [facility], [address] [place], [place] [address]    Culture   Final    NO GROWTH 3  DAYS Performed at [facility], [address], [place], [place] [address]    Report Status PENDING  Incomplete  Culture, blood (Routine X 2) w Reflex to ID Panel     Status: None (Preliminary result)   Collection Time: 12/10/22  8:59 AM   Specimen: BLOOD  Result Value Ref Range Status   Specimen Description   Final    BLOOD SITE NOT SPECIFIED Performed at [facility], [address], [place], [place] [address]    Special Requests   Final    BOTTLES DRAWN AEROBIC AND ANAEROBIC Blood Culture adequate volume Performed at [facility], [address] [place], [place] [address]    Culture   Final    NO GROWTH 3 DAYS Performed at [facility], [address], [place], [place] [address]    Report Status PENDING  Incomplete  SARS Coronavirus 2 by RT PCR ([facility] order, performed in [facility]) *cepheid single result test* Anterior Nasal Swab     Status: None   Collection Time: 12/13/22 10:04 AM   Specimen: Anterior Nasal Swab  Result Value Ref Range Status   SARS Coronavirus 2 by RT PCR NEGATIVE NEGATIVE Final    Comment: (NOTE) SARS-CoV-2 target nucleic acids are NOT DETECTED.  The SARS-CoV-2 RNA is generally detectable in upper and lower respiratory specimens during the acute phase of infection. The lowest concentration of SARS-CoV-2 viral copies this assay can detect is 250 copies / mL. A negative result does not preclude SARS-CoV-2 infection and should not be used as the sole basis for treatment or other patient management decisions.  A negative result may occur with improper specimen collection / handling, submission of specimen other than nasopharyngeal swab, presence of viral mutation(s) within the areas targeted by this assay, and inadequate number of viral copies (<250 copies / mL). A negative result must be combined with  clinical observations, patient history, and epidemiological information.  Fact Sheet  for Patients:   RoadLapTop.co.za  Fact Sheet for Healthcare Providers: http://kim-miller.com/  This test is not yet approved or  cleared by the [place] FDA and has been authorized for detection and/or diagnosis of SARS-CoV-2 by FDA under an Emergency Use Authorization (EUA).  This EUA will remain in effect (meaning this test can be used) for the duration of the COVID-19 declaration under Section 564(b)(1) of the Act, 21 U.S.C. section 360bbb-3(b)(1), unless the authorization is terminated or revoked sooner.  Performed at [facility], [address], [place], [place] [address]      Discharge Instructions:   Discharge Instructions     Call MD for:  persistant nausea and vomiting   Complete by: As directed    Call MD for:  severe uncontrolled pain   Complete by: As directed    Call MD for:  temperature >100.4   Complete by: As directed    Diet general   Complete by: As directed    Discharge instructions   Complete by: As directed    Follow-up with behavioral health.  Hold Synthroid and check thyroid function test in 3 to 5 days   Increase activity slowly   Complete by: As directed       Allergies as of 12/13/2022       Reactions   Cephalexin Rash   Codeine Anxiety, Other (See Comments)   Hallucinations and jittery Hallucinations and jittery  Pt states "anxiety and out of body experience"        Medication List     STOP taking these medications    ELDERBERRY PO   gabapentin 300 MG capsule Commonly known as: NEURONTIN   hydrochlorothiazide 25 MG tablet Commonly known as: HYDRODIURIL   JUICE PLUS FIBRE PO   methylPREDNISolone 4 MG Tbpk tablet Commonly known as: MEDROL DOSEPAK   naproxen 500 MG tablet Commonly known as: NAPROSYN   OVER THE COUNTER MEDICATION   oxyCODONE-acetaminophen 5-325 MG tablet Commonly known as: PERCOCET/ROXICET   predniSONE 20 MG tablet Commonly known  as: DELTASONE   VITAMIN C PO   ZINC PO       TAKE these medications    acetaminophen 325 MG tablet Commonly known as: TYLENOL Take 2 tablets (650 mg total) by mouth every 6 (six) hours as needed for mild pain (pain score 1-3) (or Fever >/= 101).   amLODipine 2.5 MG tablet Commonly known as: NORVASC Take 2.5 mg by mouth daily.   atorvastatin 20 MG tablet Commonly known as: LIPITOR Take 1 tablet (20 mg total) by mouth daily. For high cholesterol   cloNIDine 0.1 MG tablet Commonly known as: CATAPRES Take 1 tablet (0.1 mg total) by mouth 2 (two) times daily. For high blood pressure   fluticasone 50 MCG/ACT nasal spray Commonly known as: FLONASE Place 1 spray into both nostrils daily.   folic acid 1 MG tablet Commonly known as: FOLVITE Take 1 tablet (1 mg total) by mouth daily.   levothyroxine 137 MCG tablet Commonly known as: SYNTHROID Take 1 tablet (137 mcg total) by mouth daily before breakfast. For low thyroid function   lidocaine 5 % Commonly known as: Lidoderm Place 1 patch onto the skin daily. Remove & Discard patch within 12 hours or as directed by MD   LORazepam 1 MG tablet Commonly known as: ATIVAN Take 1 tablet (1 mg total) by mouth every 6 (six) hours as needed for anxiety. What changed:  how much to take how to take this when to take this reasons to take this   losartan 100 MG tablet Commonly known as: COZAAR Take by mouth.   multivitamin tablet Take 1 tablet by mouth daily.   OMEGA 3 PO Take 1,000 mg by mouth daily.   senna-docusate 8.6-50 MG tablet Commonly known as: Senokot-S Take 1 tablet by mouth at bedtime as needed for mild constipation or moderate constipation.   thiamine 100 MG tablet Commonly known as: VITAMIN B1 Take 1 tablet (100 mg total) by mouth daily.          Time coordinating discharge: 39 minutes  Signed:  Kirrah Wolfe  [facility] 12/13/2022, 1:09 PM

## 2022-12-13 NOTE — Plan of Care (Signed)

## 2022-12-13 NOTE — TOC Initial Note (Signed)
Transition of Care [facility]) - Initial/Assessment Note    Patient Details  Name: Melissa Wolfe MRN: [id number] Date of Birth: May 19, 1950  Transition [facility] Care [facility]) CM/SW Contact:    Beckie Busing, RN Phone [phone]  12/13/2022, 1:37 PM  Clinical Narrative:                 [facility] consulted for patient with gero psych recommendation. Patient is agreeable to inpatient gero psych at [facility]. Patient is agreeable as long as she is close to home. Discharge orders are in. CM spoke with Kenney Houseman at [facility]. Per Mardella Layman patient has not been officially accepted yet. AC thought there was a bed but did not realize room was closed for repair. If leak is repaired or someone is discharged room may be available later today or tomorrow. TOC will continue to follow.   Expected Discharge Plan: Psychiatric Hospital (Inpatient gero psych) Barriers to Discharge: Psych Bed not available (awaiting gero psych bed)   Patient Goals and CMS Choice Patient states their goals for this hospitalization and ongoing recovery are:: Patient wants to go to behavioral health close to [facility] ownership interest in [facility].provided to:: Patient    Expected Discharge Plan and Services   Discharge Planning Services: CM Consult   Living arrangements for the past 2 months: Apartment Expected Discharge Date: 12/13/22               DME Arranged: N/A DME Agency: NA       HH Arranged: NA HH Agency: NA        Prior Living Arrangements/Services Living arrangements for the past 2 months: Apartment Lives with:: Self Patient language and need for interpreter reviewed:: Yes Do you feel safe going back to the place where you live?: Yes      Need for Family Participation in Patient Care: Yes (Comment) Care giver support system in place?: Yes (comment)      Activities of Daily Living   ADL Screening (condition at time of admission) Independently performs ADLs?: Yes (appropriate  for developmental age) Is the patient deaf or have difficulty hearing?: No Does the patient have difficulty seeing, even when wearing glasses/contacts?: No Does the patient have difficulty concentrating, remembering, or making decisions?: No  Permission Sought/Granted Permission sought to share information with : Family Supports Permission granted to share information with : No              Emotional Assessment Appearance:: Appears stated age Attitude/Demeanor/Rapport: Unable to Assess Affect (typically observed): Withdrawn Orientation: : Oriented to Self Alcohol / Substance Use: Not Applicable Psych Involvement: Yes (comment)  Admission diagnosis:  Metabolic encephalopathy [G93.41] Acute cystitis without hematuria [N30.00] Acute encephalopathy [G93.40] Patient Active Problem List   Diagnosis Date Noted   Fall 12/11/2022   Positive blood culture 12/10/2022   Acute encephalopathy 12/09/2022   CKD stage 3a, GFR 45-59 ml/min (HCC) 12/09/2022   Hypokalemia 12/09/2022   Hypothyroid    Hypertension    Alcohol dependence with uncomplicated withdrawal (HCC)    Uncomplicated alcohol dependence (HCC)    Alcohol dependence (HCC) 06/23/2014   Depression, major, recurrent, moderate (HCC) 06/23/2014   Alcohol abuse with alcohol-induced disorder (HCC) 06/22/2014   PCP:  Creola Corn, MD Pharmacy:   [facility] [address] [place], [place] - [address] [place] [address] Phone: [phone] Fax: [phone]  [facility] [address] [place], [place] - [phone] [place] [address] Phone: [phone] Fax: [phone]  [id number] - [place], [place] - [address] [place] [address] Phone: [phone] Fax: [phone]  [facility] - [place], [place] - [address] [place] [address] Phone: [phone] Fax:  [phone]  [id number] [place], [place] - [address] [place] [address] Phone: [phone] Fax: [phone]     Social Drivers of Health (SDOH) Social History: SDOH Screenings   Food Insecurity: No Food Insecurity (12/09/2022)  Transportation Needs: No Transportation Needs (12/09/2022)  Utilities: Not At Risk (12/09/2022)  Social Connections: Unknown (11/21/2021)   [facility] from [facility]  Tobacco Use: High Risk (12/09/2022)   SDOH Interventions: Food Insecurity Interventions: Intervention Not Indicated Housing Interventions: Patient Declined Transportation Interventions: Patient Declined Utilities Interventions: Patient Declined   Readmission Risk Interventions     No data to display

## 2022-12-13 NOTE — Consult Note (Signed)
[name] Consult Evaluation  Service Date: December 13, 2022 LOS:  LOS: 3 days    Primary Psychiatric Diagnoses  Major Depression Disorder 2.  Adjustment Disorder 3.  Bereavement  Assessment  Melissa Wolfe is a 72 y.o. female admitted medically for 12/09/2022  1:14 PM for weakness, encephalopathy, and falls. They have a history of HTN, HLD, depression, alcohol use disorder reports last drink about 4months ago. SHe was admitted with altered mental status after being found in the floor. Inpatient work up thus far shows negative EEG, + GNR currently taking cefepime. During the hospitalization she endorses suicidal ideations to nursing staff therefore psychiatric consult was placed.   Her current presentation of worsening depressive symptoms, hopelessness, worthlessness, guilty, isolation, suicidal thoughts, and anhedonia is most consistent with MDD, severe. She meets criteria for MDD based on the above symptoms that have been ongoing > 2 weeks. She reports her suicidal thoughts started immediately after the passing of her daughter in February in 2024. She is taking outpatient medications that include Gabapentin and Ativan, UDS positive for BZD.  She reports taking an antidepressant, however chart review does not reflect history. On initial examination, patient is alert and oriented x 4, calm and cooperative, attentive, and engages well with this provider. She reports worsening depressive symptoms since the untimely suicide of her daughter. She expresses that her daughter was her only child, and left a grandson age 35 who resides with his father. She reports she is the primary caretaker of her mother age 62, and those are the only reason she has not attempted. She states " I don't know how I am going to go on life without her. " She states her suicidal thoughts were passive until about 3 months ago when she actual developed a plan to jump off a bridge. Prior to her daughters death she  denies any psychiatric history, inpatient psychiatric admissions, outpatient services or psychotropic medications. She denies any grief or counseling services following her death. She denies any history of suicide attempts, non suicidal self injurious behaviors. Please see plan below for detailed recommendations.    12/12: She is continuing to have severe depression and Passive SI with a plan (jump from bridge).  She continues to meet criteria for inpatient psychiatric hospitalization.  She has been accepted to [facility] unit so we will not recommend any medication changes at this time.  These recommendations have been communicated with the primary team.   Diagnoses:  Active Hospital problems: Principal Problem:   Acute encephalopathy Active Problems:   Alcohol dependence (HCC)   Depression, major, recurrent, moderate (HCC)   Hypothyroid   Hypertension   CKD stage 3a, GFR 45-59 ml/min (HCC)   Hypokalemia   Positive blood culture   Fall     Plan   ## Psychiatric Medication Recommendations:  -- Continue prns at this time. Patient will benefit from antidepressant and possibly an adjunct medication (Abilify or venlafaxine).   Currently receiving cefepime- if her mental status continues to wax and wane recommend use of another abx as this cause cause worsening neuropsychiatric symptoms.   ## Medical Decision Making Capacity:  Not formally assessed but appears to have capacity. There does not appear to be any acute psychiatric condition or encephalopathy that will prohibit decision making at this time.   ## Further Work-up:  --  TSH, B12, folate, EKG, or While pt on Qtc prolonging medications, please monitor & replete K+ to 4 and Mg2+ to 2   --  most recent EKG on 12/8 had QtC of 466 -- Pertinent labwork reviewed earlier this admission includes: Hypokalemia 3.2, TSH 0.031, T3 1.7, Free T4 1.55, Urinalysis Small leukocytes. UDS positive for BZD and THC.   -Recommend PT/OT to increase  physical strength and prevent further deconditioning Will defer hyperthyroidism to primary team- agree with rec to hold Synthroid.  ## Disposition:  -- We recommend inpatient psychiatric hospitalization when medically cleared. Patient is under voluntary admission status at this time; please IVC if attempts to leave hospital. She has been accepted to [facility].  ## Behavioral / Environmental:  --  Utilize compassion and acknowledge the patient's experiences while setting clear and realistic expectations for care.   ## Safety and Observation Level:  - Based on my clinical evaluation, I estimate the patient to be at moderate risk of self harm in the current setting.  - At this time, we recommend a 1:1 level of observation. This decision is based on my review of the chart including patient's history and current presentation, interview of the patient, mental status examination, and consideration of suicide risk including evaluating suicidal ideation, plan, intent, suicidal or self-harm behaviors, risk factors, and protective factors. This judgment is based on our ability to directly address suicide risk, implement suicide prevention strategies and develop a safety plan while the patient is in the clinical setting. Please contact our team if there is a concern that risk level has changed.  Suicide risk assessment  Patient has following modifiable risk factors for suicide: active suicidal ideation, untreated depression, social isolation, and lack of access to outpatient mental health resources, which we are addressing by inpatient psychiatric admission, medication management once medically stable, 1:1 safety sitter.   Patient has following non-modifiable or demographic risk factors for suicide: family hx of suicide completion.   Patient has the following protective factors against suicide: Access to outpatient mental health care, Supportive family, Supportive friends, Cultural, spiritual, or religious beliefs  that discourage suicide, Frustration tolerance, no history of suicide attempts, and no history of NSSIB Grandson (16). Willing to seek inpatient treatment.    Thank you for this consult request. Recommendations have been communicated to the primary team.  We will continue to follow at this time until she transfers to [facility] unit.   Lauro Franklin, MD  Psychiatric and Social History   Relevant Aspects of Hospital Course:  Admitted on 12/09/2022 for weakness, encephalopathy and falls. They have a history of HTN, HLD, depression, alcohol use disorder reports last drink about 4 months ago. She was admitted with altered mental status after being found in the floor. Inpatient work up thus far shows negative EEG, + GNR currently taking cefepime. During the hospitalization she endorses suicidal ideations to nursing staff therefore psychiatric consult was placed.    Patient Report:  She reports that she feels ok today but reports she did not sleep at all last night.  She reports she gets tired but when she thinks she will fall asleep her mind starts racing and she is unable to sleep.  She reports she continues to have thoughts of jumping off a bridge.  Discussed with her that she had been accepted to [facility] unit and hopefully would be transfering today.  She reports she is hopeful that her thoughts of SI will improve.  She reports SI with plan (jump off bridge).  She reports no HI or AVH.  She reports her sleep was poor last night.  She reports her appetite is fair.  She reports no other concerns at present.   Psych ROS:  12/12: She continues to have severe depression.  She continues to endorse SI with a plan, means, and access (jump from bridge).   12/11: Depression: Worthless, hopeless, guilty, suicidal thoughts, anhedonia, hypersomnia, poor appetite, weight loss, sadness, decreased concentration, recurrent thoughts of death.  Anxiety:  excessive worrying and nervousness Mania (lifetime and  current): easily agitated and irritable, compulsive spending and impulse buying. Denies any mania, grandiosity, pressured speech.  Psychosis: (lifetime and current): Denies  Collateral information:  Collateral not obtained at this time. Patient good historian, openly admitting suicidal thoughts with intent and willing to come into the hospital. Will need eventually but will defer at this time.   Psychiatric History:  Information collected from patient and chart reviewed.   Prev Dx/Sx: Major Depressive Disorder diagnosed after daughter completed.  Current Psych Provider: N/a Home Meds (current): N/a Previous Med Trials: Reports taking an antidepressant unable to recall, chart review does not reflect recent or previous use of antidepressant.  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  Spiritual Hx: non denominational Access to weapons: Denies  Substance History Tobacco use: Denies Alcohol use: Hx of alcohol use disorder. Tells me her last drink about 4 months ago.  Drug use: Denies, UDS positive for THC and BZD(active rx).    Exam Findings   Psychiatric Specialty Exam:  Presentation  General Appearance: Appropriate for Environment; Casual  Eye Contact:Good  Speech:Clear and Coherent; Normal Rate  Speech Volume:Normal  Handedness:Right   Mood and Affect  Mood:Depressed  Affect:Constricted; Depressed   Thought Process  Thought Processes:Coherent; Linear  Descriptions of Associations:Intact  Orientation:Full (Time, Place and Person)  Thought Content:WDL  Hallucinations:Hallucinations: None  Ideas of Reference:None  Suicidal  Thoughts:Suicidal Thoughts: Yes, Passive SI Passive Intent and/or Plan: With Plan; With Means to Carry Out; With Access to Means  Homicidal Thoughts:Homicidal Thoughts: No   Sensorium  Memory:Immediate Good; Recent Good  Judgment:Fair  Insight:Fair   Executive Functions  Concentration:Good  Attention Span:Good  Recall:Good  Fund of Knowledge:Good  Language:Good   Psychomotor Activity  Psychomotor Activity:Psychomotor Activity: Normal   Assets  Assets:Communication Skills; Desire for Improvement; Financial Resources/Insurance; Housing; Physical Health   Sleep  Sleep:Sleep: Good (she reports poor) Number of Hours of Sleep: 16    Physical Exam: Vital signs:  Temp:  [98.3 F (36.8 C)-99.6 F (37.6 C)] 99.3 F (37.4 C) (12/12 0547) Pulse Rate:  [72-93] 72 (12/12 0547) Resp:  [16-18] 17 (12/12 0547) BP: (140-167)/(77-92) 167/91 (12/12 0547) SpO2:  [93 %-99 %] 99 % (12/12 0547) Physical Exam Vitals and nursing note reviewed.  Constitutional:      General: She is not in acute distress.    Appearance: Normal appearance. She is not ill-appearing or toxic-appearing.  HENT:     Head: Normocephalic and atraumatic.  Pulmonary:     Effort: Pulmonary effort is normal.  Neurological:     Mental Status: She is alert.     Blood pressure (!) 167/91, pulse 72, temperature 99.3 F (37.4 C), temperature source Oral, resp. rate 17, height 4\' 11"  (1.499 m), weight 57.7 kg, SpO2 99%. Body mass index is 25.69 kg/m.   Other History  These have been pulled in through the EMR, reviewed, and updated if appropriate.   Family History:  Daughter completed in Feb 2024 via intentional drug overdose. Per patient she expressed her intent to die.  The patient's family history is not on file.  Medical History: Past Medical History:  Diagnosis Date   Alcoholism (HCC)    Allergy    Depression    Hyperlipemia    Hypertension    Hypothyroid     Surgical History: Past  Surgical History:  Procedure Laterality Date   BREAST EXCISIONAL BIOPSY Left 2008   benign   COLONOSCOPY     fallopian ablation     HAND SURGERY Left 05/2015   MYOMECTOMY     x2   OOPHORECTOMY Right    POLYPECTOMY      Medications:   Current Facility-Administered Medications:    acetaminophen (TYLENOL) tablet 650 mg, 650 mg, Oral, Q6H PRN **OR** acetaminophen (TYLENOL) suppository 650 mg, 650 mg, Rectal, Q6H PRN, Opyd, Lavone Neri, MD   amLODipine (NORVASC) tablet 2.5 mg, 2.5 mg, Oral, Daily, Pokhrel, Laxman, MD, 2.5 mg at 12/13/22 0955   enoxaparin (LOVENOX) injection 40 mg, 40 mg, Subcutaneous, Q24H, Legge, Justin M, RPH   haloperidol lactate (HALDOL) injection 5 mg, 5 mg, Intravenous, Q6H PRN, Lewie Chamber, MD, 5 mg at 12/11/22 1632   LORazepam (ATIVAN) tablet 1 mg, 1 mg, Oral, Q6H PRN, Pokhrel, Laxman, MD   ondansetron (ZOFRAN) tablet 4 mg, 4 mg, Oral, Q6H PRN **OR** ondansetron (ZOFRAN) injection 4 mg, 4 mg, Intravenous, Q6H PRN, Opyd, Lavone Neri, MD   Oral care mouth rinse, 15 mL, Mouth Rinse, PRN, Girguis, David, MD   senna-docusate (Senokot-S) tablet 1 tablet, 1 tablet, Oral, QHS PRN, Opyd, Lavone Neri, MD  Allergies: Allergies  Allergen Reactions   Cephalexin Rash   Codeine Anxiety and Other (See Comments)    Hallucinations and jittery  Hallucinations and jittery  Pt states "anxiety and out of body experience"

## 2022-12-14 DIAGNOSIS — G9340 Encephalopathy, unspecified: Secondary | ICD-10-CM | POA: Diagnosis not present

## 2022-12-14 DIAGNOSIS — F1020 Alcohol dependence, uncomplicated: Secondary | ICD-10-CM | POA: Diagnosis not present

## 2022-12-14 DIAGNOSIS — F331 Major depressive disorder, recurrent, moderate: Secondary | ICD-10-CM | POA: Diagnosis not present

## 2022-12-14 LAB — CBC WITH DIFFERENTIAL/PLATELET
Abs Immature Granulocytes: 0.17 10*3/uL — ABNORMAL HIGH (ref 0.00–0.07)
Basophils Absolute: 0.1 10*3/uL (ref 0.0–0.1)
Basophils Relative: 1 %
Eosinophils Absolute: 0.4 10*3/uL (ref 0.0–0.5)
Eosinophils Relative: 4 %
HCT: 39.6 % (ref 36.0–46.0)
Hemoglobin: 13.2 g/dL (ref 12.0–15.0)
Immature Granulocytes: 2 %
Lymphocytes Relative: 24 %
Lymphs Abs: 1.9 10*3/uL (ref 0.7–4.0)
MCH: 30.6 pg (ref 26.0–34.0)
MCHC: 33.3 g/dL (ref 30.0–36.0)
MCV: 91.9 fL (ref 80.0–100.0)
Monocytes Absolute: 0.7 10*3/uL (ref 0.1–1.0)
Monocytes Relative: 9 %
Neutro Abs: 4.8 10*3/uL (ref 1.7–7.7)
Neutrophils Relative %: 60 %
Platelets: 263 10*3/uL (ref 150–400)
RBC: 4.31 MIL/uL (ref 3.87–5.11)
RDW: 12.4 % (ref 11.5–15.5)
WBC: 8.1 10*3/uL (ref 4.0–10.5)
nRBC: 0 % (ref 0.0–0.2)

## 2022-12-14 LAB — MAGNESIUM: Magnesium: 1.9 mg/dL (ref 1.7–2.4)

## 2022-12-14 MED ORDER — LOSARTAN POTASSIUM 50 MG PO TABS
100.0000 mg | ORAL_TABLET | Freq: Every day | ORAL | Status: DC
Start: 2022-12-14 — End: 2022-12-17
  Administered 2022-12-16 – 2022-12-17 (×2): 100 mg via ORAL
  Filled 2022-12-14 (×4): qty 2

## 2022-12-14 MED ORDER — HYDROCHLOROTHIAZIDE 25 MG PO TABS
25.0000 mg | ORAL_TABLET | Freq: Every day | ORAL | Status: DC
  Filled 2022-12-14: qty 1

## 2022-12-14 MED ORDER — POTASSIUM CHLORIDE CRYS ER 20 MEQ PO TBCR
40.0000 meq | EXTENDED_RELEASE_TABLET | Freq: Once | ORAL | Status: AC
  Administered 2022-12-14: 40 meq via ORAL
  Filled 2022-12-14: qty 2

## 2022-12-14 MED ORDER — AMLODIPINE BESYLATE 5 MG PO TABS
5.0000 mg | ORAL_TABLET | Freq: Every day | ORAL | Status: DC
  Administered 2022-12-15: 5 mg via ORAL
  Filled 2022-12-14: qty 1

## 2022-12-14 MED ORDER — CLONIDINE HCL 0.1 MG PO TABS
0.1000 mg | ORAL_TABLET | Freq: Two times a day (BID) | ORAL | Status: DC
Start: 2022-12-14 — End: 2022-12-17
  Administered 2022-12-14 – 2022-12-17 (×7): 0.1 mg via ORAL
  Filled 2022-12-14 (×8): qty 1

## 2022-12-14 MED ORDER — TAB-A-VITE/IRON PO TABS
1.0000 | ORAL_TABLET | Freq: Every day | ORAL | Status: DC
  Administered 2022-12-15 – 2022-12-16 (×2): 1 via ORAL
  Filled 2022-12-14 (×4): qty 1

## 2022-12-14 NOTE — Progress Notes (Addendum)
1644- Pt calm resting in bed  no acute distress noted   pt safety measures in place  call bell within reach  pt not confused and is alert to person place and time will monitor through remainder of shift and pass on information to oncoming Rn   1917-  pt calm resting in bed  sitter at bedside  no distress noted   safety measures in place  handoff report completed with karen Rn

## 2022-12-14 NOTE — TOC Progression Note (Addendum)
Transition of Care [facility]) - Progression Note    Patient Details  Name: Melissa Wolfe MRN: [id number] Date of Birth: 03-25-1950  [facility] CM/SW Contact  Beckie Busing, RN Phone [phone]  12/14/2022, 11:35 AM  Clinical Narrative:    Currently there are still no available beds at [facility] for gero psych. Patient referral has been faxed out to [facility] [phone] confirmed, [facility] [place] [facility] [phone] [facility] [phone] confirmed, [facility], [phone] confirmed )   Expected Discharge Plan: Psychiatric Hospital (Inpatient gero psych) Barriers to Discharge: Psych Bed not available (awaiting gero psych bed)  Expected Discharge Plan and Services   Discharge Planning Services: CM Consult   Living arrangements for the past 2 months: Apartment Expected Discharge Date: 12/13/22               DME Arranged: N/A DME Agency: NA       HH Arranged: NA HH Agency: NA         Social Determinants of Health (SDOH) Interventions SDOH Screenings   Food Insecurity: No Food Insecurity (12/09/2022)  Transportation Needs: No Transportation Needs (12/09/2022)  Utilities: Not At Risk (12/09/2022)  Social Connections: Unknown (11/21/2021)   Received from [facility]  Tobacco Use: High Risk (12/09/2022)    Readmission Risk Interventions     No data to display

## 2022-12-14 NOTE — Progress Notes (Signed)
PROGRESS NOTE    Melissa Wolfe  UJW:[id number] DOB: March 01, 1950 DOA: 12/09/2022 PCP: Creola Corn, MD    Brief Narrative:   Melissa Wolfe is a 72 year old female with past medical history of hypertension, hyperlipidemia, hypothyroidism  depression, alcohol abuse disorder hospital with altered mental status.  Of note patient was recently seen in the ED on 12/04/2022 for left hip pain and was prescribed prednisone.  Since that time she had been having worsening confusion and irritability.  There was some concern regarding patient consuming alcohol as well.  In the ED, patient was afebrile without any leukocytosis.  Urinalysis was negative.  Blood cultures were obtained.  Patient was then admitted hospital for further evaluation and treatment.    Assessment and Plan:  * Acute encephalopathy Unknown etiology.  Could be secondary to infectious/substance abuse/medication effect.  Patient is on Ativan and gabapentin.  Possibility of side effect with underlying decreased renal function.  Urine drug screen with benzos and THC.  Monitor for signs of alcohol withdrawal.  Noted to be on the floor on 12/19/2022.  EEG was done without any signs of seizure-like activity.  Currently resolved.    Fall on the floor. On 12/11/2022 no external  injury.  EEG without signs of seizure.  Has been seen by PT and recommended skilled nursing facility placement.  Positive blood culture Polymicrobial gram-positive species including Staph hominis, capitis, epidermidis from blood culture of 12/09/2022.Marland Kitchen Repeat blood culture on 12/ 9 negative so far. Marland Kitchen  Spoke with infectious disease and likely contaminant.  Not on antibiotics.   Depression, major, recurrent, moderate (HCC) Had some suicidal ideation 12/11/2022 with expression of wanting to jump off the bridge.  Currently on one-to-one sitter.     Psychiatry on board and plan for Geri psych unit admission on discharge.  CKD stage 3a, GFR 45-59 ml/min (HCC) Previous labs  from 2016 with baseline creatinine around 1.2 and GFR ~ 40.  Creatinine at this time at 0.9.   Hypokalemia Improved after replacement.   Hypertension Continue amlodipine clonidine losartan.  Potassium okay today.  Blood pressure still elevated.  HCTZ still on hold will resume starting today.  Increase dose of amlodipine to 5 mg daily.   Hypothyroidism - TSH suppressed, 0.031 with free T4 elevated at 1.5.  Synthroid on hold at this time.  Plan to reassess thyroid function again before reinitiating in few days.    Alcohol use disorder. On as needed Ativan on discharge.  Debility, deconditioning.  Recent fall..  PT recommending skilled nursing facility placement.     DVT prophylaxis: enoxaparin (LOVENOX) injection 40 mg Start: 12/13/22 2200   Code Status:     Code Status: Full Code  Disposition: To Geri psych unit when bed available.  TOC on board.  Medically stable for disposition.  Status is: Inpatient Remains inpatient appropriate because: , awaiting for placement.   Family Communication: None at bedside  Consultants:  Psychiatry  Procedures:  EEG  Antimicrobials:  None  Subjective: Today, patient was seen and examined at bedside.  Patient denies interval complaints.  Denies any nausea vomiting fever chills or rigor.  Wants Tylenol Extra Strength.  Objective: Vitals:   12/13/22 0547 12/13/22 1300 12/13/22 1932 12/14/22 0441  BP: (!) 167/91 (!) 149/97 (!) 150/88 (!) 178/96  Pulse: 72 87 77 79  Resp: 17 18 18 16   Temp: 99.3 F (37.4 C) 98.3 F (36.8 C) 97.8 F (36.6 C) 99.6 F (37.6 C)  TempSrc: Oral Oral Oral Oral  SpO2: 99%  95% 95% 96%  Weight:      Height:        Intake/Output Summary (Last 24 hours) at 12/14/2022 1130 Last data filed at 12/14/2022 0610 Gross per 24 hour  Intake --  Output 750 ml  Net -750 ml   Filed Weights   12/09/22 2258  Weight: 57.7 kg    Physical Examination: Body mass index is 25.69 kg/m.   General:  Average built,  not in obvious distress, more interactive. HENT:   No scleral pallor or icterus noted. Oral mucosa is moist.  Chest:  Clear breath sounds.  . No crackles or wheezes.  CVS: S1 &S2 heard. No murmur.  Regular rate and rhythm. Abdomen: Soft, nontender, nondistended.  Bowel sounds are heard.   Extremities: No cyanosis, clubbing or edema.  Peripheral pulses are palpable. Psych: Alert, awake and Communicative. CNS:  No cranial nerve deficits.  Moves all extremities, generalized weakness noted. Skin: Warm and dry.  No rashes noted.  Data Reviewed:   CBC: Recent Labs  Lab 12/09/22 1347 12/10/22 0753 12/11/22 0802 12/12/22 0732 12/13/22 0602 12/14/22 0908  WBC 8.0 7.0 6.7 6.2 7.1 8.1  NEUTROABS 5.1  --  4.0 3.4 4.1 4.8  HGB 13.5 12.8 12.4 12.1 12.6 13.2  HCT 40.2 38.7 37.3 35.7* 37.5 39.6  MCV 93.5 94.4 93.5 92.2 92.6 91.9  PLT 235 243 214 218 228 263    Basic Metabolic Panel: Recent Labs  Lab 12/09/22 1347 12/10/22 0753 12/11/22 0802 12/12/22 0732 12/13/22 0602 12/14/22 0908  NA 140 139 141 138 138  --   K 3.4* 4.0 3.5 3.2* 3.7  --   CL 110 109 109 112* 109  --   CO2 23 22 22  20* 22  --   GLUCOSE 118* 101* 100* 94 109*  --   BUN 48* 47* 37* 25* 18  --   CREATININE 1.32* 1.35* 1.15* 0.93 0.75  --   CALCIUM 9.2 8.8* 9.2 8.8* 9.0  --   MG  --  2.1 2.1 1.9 1.9 1.9  PHOS  --  2.9  --   --   --   --     Liver Function Tests: Recent Labs  Lab 12/09/22 1347  AST 25  ALT 19  ALKPHOS 92  BILITOT 1.0  PROT 6.8  ALBUMIN 3.5     Radiology Studies: No results found.    LOS: 4 days     Joycelyn Das, MD [facility] Available via Epic secure chat 7am-7pm After these hours, please refer to coverage provider listed on [email or web address] 12/14/2022, 11:30 AM

## 2022-12-14 NOTE — TOC Progression Note (Deleted)
Transition of Care [facility]) - Progression Note    Patient Details  Name: Melissa Wolfe MRN: [id number] Date of Birth: 03-20-1950  Transition of Care [facility] CM/SW Contact  Beckie Busing, RN Phone Number: 12/14/2022, 11:39 AM  Clinical Narrative:       Expected Discharge Plan: Psychiatric Hospital (Inpatient gero psych) Barriers to Discharge: Psych Bed not available (awaiting gero psych bed)  Expected Discharge Plan and Services   Discharge Planning Services: CM Consult   Living arrangements for the past 2 months: Apartment Expected Discharge Date: 12/13/22               DME Arranged: N/A DME Agency: NA       HH Arranged: NA HH Agency: NA         Social Determinants of Health (SDOH) Interventions SDOH Screenings   Food Insecurity: No Food Insecurity (12/09/2022)  Transportation Needs: No Transportation Needs (12/09/2022)  Utilities: Not At Risk (12/09/2022)  Social Connections: Unknown (11/21/2021)   Received from [facility]  Tobacco Use: High Risk (12/09/2022)    Readmission Risk Interventions     No data to display

## 2022-12-14 NOTE — Consult Note (Signed)
[name] Consult Evaluation  Service Date: December 14, 2022 LOS:  LOS: 4 days    Primary Psychiatric Diagnoses  Major Depression Disorder 2.  Adjustment Disorder 3.  Bereavement  Assessment  Melissa Wolfe is a 72 y.o. female admitted medically for 12/09/2022  1:14 PM for weakness, encephalopathy, and falls. They have a history of HTN, HLD, depression, alcohol use disorder reports last drink about 4 months ago. She was admitted with altered mental status after being found in the floor. Inpatient work up thus far shows negative EEG, + GNR currently taking cefepime. During the hospitalization she endorses suicidal ideations to nursing staff therefore psychiatric consult was placed.   Her current presentation of worsening depressive symptoms, hopelessness, worthlessness, guilty, isolation, suicidal thoughts, and anhedonia is most consistent with MDD, severe. She meets criteria for MDD based on the above symptoms that have been ongoing > 2 weeks. She reports her suicidal thoughts started immediately after the passing of her daughter in 03-03-22 in 2024. She is taking outpatient medications that include Gabapentin and Ativan, UDS positive for BZD.  She reports taking an antidepressant, however chart review does not reflect history. On initial examination, patient is alert and oriented x 4, calm and cooperative, attentive, and engages well with this provider. She reports worsening depressive symptoms since the untimely suicide of her daughter. She expresses that her daughter was her only child, and left a grandson age 74 who resides with his father. She reports she is the primary caretaker of her mother age 50, and those are the only reason she has not attempted. She states " I don't know how I am going to go on life without her. " She states her suicidal thoughts were passive until about 3 months ago when she actual developed a plan to jump off a bridge. Prior to her daughters death she  denies any psychiatric history, inpatient psychiatric admissions, outpatient services or psychotropic medications. She denies any grief or counseling services following her death. She denies any history of suicide attempts, non suicidal self injurious behaviors. Please see plan below for detailed recommendations.    12/13: Patient continues to be depressed and tearful during assessment.  She describes her ongoing grief from her daughter that died of suicide Mar 03, 2022.  She is future oriented recognizing that she needs to care for her 54 year old mother with whom she leaves (she does have nursing assistance that comes in 8 hours a day, and her older brother lives across the street).  She also reports a good relationship with her 49 year old grandson who is now currently living with his father since her daughter's passing.  Patient recognizes that she had become too weak to even use her walker and then developed a "bad mindset" during which time she allowed herself to become even more depressed, was drinking alcohol, and had been crawling in her home.  She had sustained a fall injury, and found it was safer to crawl into re-injure herself.  Patient continues to have suicidal ideation. She continues to meet criteria for inpatient psychiatric hospitalization.  She had been accepted to [facility] psych, unfortunately due to a facility problem that is no longer available.  Will plan to seek inpatient psychiatric admission at outside facility. These recommendations have been communicated with the primary team.   Diagnoses:  Active Hospital problems: Principal Problem:   Acute encephalopathy Active Problems:   Alcohol dependence (HCC)   Depression, major, recurrent, moderate (HCC)   Hypothyroid   Hypertension  CKD stage 3a, GFR 45-59 ml/min (HCC)   Hypokalemia   Positive blood culture   Fall     Plan   ## Psychiatric Medication Recommendations:  -- Continue prns at this time. Patient will  benefit from antidepressant and possibly an adjunct medication (Abilify or venlafaxine).  Deferred to inpatient treatment team.  Recently received cefepime- if her mental status continues to wax and wane recommend use of another abx as this can cause worsening neuropsychiatric symptoms.   ## Medical Decision Making Capacity:  Not formally assessed but appears to have capacity. There does not appear to be any acute psychiatric condition or encephalopathy that will prohibit decision making at this time.   ## Further Work-up:  --  TSH, B12, folate, EKG, or While pt on Qtc prolonging medications, please monitor & replete K+ to 4 and Mg2+ to 2   -- most recent EKG on 12/8 had QtC of 466 -- Pertinent labwork reviewed earlier this admission includes: Hypokalemia 3.2, TSH 0.031, T3 1.7, Free T4 1.55, Urinalysis Small leukocytes. UDS positive for BZD and THC.   -Recommend PT/OT to increase physical strength and prevent further deconditioning Will defer hyperthyroidism to primary team- agree with rec to hold Synthroid.  ## Disposition:  -- We recommend inpatient psychiatric hospitalization when medically cleared. Patient is under voluntary admission status at this time; please IVC if attempts to leave hospital. She has been accepted to [facility].  ## Behavioral / Environmental:  --  Utilize compassion and acknowledge the patient's experiences while setting clear and realistic expectations for care.   ## Safety and Observation Level:  - Based on my clinical evaluation, I estimate the patient to be at moderate risk of self harm in the current setting.  - At this time, we recommend a 1:1 level of observation. This decision is based on my review of the chart including patient's history and current presentation, interview of the patient, mental status examination, and consideration of suicide risk including evaluating suicidal ideation, plan, intent, suicidal or self-harm behaviors, risk factors, and  protective factors. This judgment is based on our ability to directly address suicide risk, implement suicide prevention strategies and develop a safety plan while the patient is in the clinical setting. Please contact our team if there is a concern that risk level has changed.  Suicide risk assessment  Patient has following modifiable risk factors for suicide: active suicidal ideation, untreated depression, social isolation, and lack of access to outpatient mental health resources, which we are addressing by inpatient psychiatric admission, medication management once medically stable, 1:1 safety sitter.   Patient has following non-modifiable or demographic risk factors for suicide: family hx of suicide completion.   Patient has the following protective factors against suicide: Access to outpatient mental health care, Supportive family, Supportive friends, Cultural, spiritual, or religious beliefs that discourage suicide, Frustration tolerance, no history of suicide attempts, and no history of NSSIB Grandson (16). Willing to seek inpatient treatment.    Thank you for this consult request. Recommendations have been communicated to the primary team.  We will continue to follow at this time until she transfers to a higher level of care with inpatient psychiatry.  Mariel Craft, MD  Psychiatric and Social History   Relevant Aspects of Hospital Course:  Admitted on 12/09/2022 for weakness, encephalopathy and falls. They have a history of HTN, HLD, depression, alcohol use disorder reports last drink about 4 months ago. She was admitted with altered mental status after being found in the floor.  Inpatient work up thus far shows negative EEG, + GNR currently taking cefepime. During the hospitalization she endorses suicidal ideations to nursing staff therefore psychiatric consult was placed.    Patient Report:  She continues to endorse suicidal ideation and depression grief.  Sleep and appetite have been  fair.  She denies AVH.   Psych ROS:  12/13: She continues to have severe depression.  She continues to endorse SI with a plan, means, and access (jump from bridge).   Collateral information:  Collateral not obtained at this time. Patient good historian, openly admitting suicidal thoughts with intent and willing to come into the hospital. Will need eventually but will defer at this time.   Psychiatric History:  Information collected from patient and chart reviewed.   Prev Dx/Sx: Major Depressive Disorder diagnosed after daughter completed.  Current Psych Provider: N/a Home Meds (current): N/a Previous Med Trials: Reports taking an antidepressant unable to recall, chart review does not reflect recent or previous use of antidepressant.  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  Spiritual Hx: non denominational Access to weapons: Denies  Substance History Tobacco use: Denies Alcohol use: Hx of alcohol use disorder. Tells me her last drink about 4 months ago.  Drug use: Denies, UDS positive for THC and BZD(active rx).    Exam Findings   Psychiatric Specialty Exam:  Presentation  General Appearance: Appropriate for Environment; Casual  Eye Contact:Good  Speech:Clear and Coherent  Speech Volume:Decreased  Handedness:Right   Mood and Affect  Mood:Depressed  Affect:Constricted; Depressed   Thought Process  Thought Processes:Coherent; Linear  Descriptions of Associations:Intact  Orientation:Full (Time, Place and Person)  Thought Content:Logical  Hallucinations:Hallucinations: None  Ideas of Reference:None  Suicidal Thoughts:Suicidal  Thoughts: Yes, Passive SI Passive Intent and/or Plan: With Plan; With Means to Carry Out; With Access to Means  Homicidal Thoughts:Homicidal Thoughts: No   Sensorium  Memory:Immediate Good; Recent Good  Judgment:Fair  Insight:Fair   Executive Functions  Concentration:Good  Attention Span:Good  Recall:Good  Fund of Knowledge:Good  Language:Good   Psychomotor Activity  Psychomotor Activity:Psychomotor Activity: Normal   Assets  Assets:Communication Skills; Desire for Improvement; Financial Resources/Insurance; Housing; Physical Health   Sleep  Sleep:Sleep: Good (she reports poor)    Physical Exam: Vital signs:  Temp:  [97.8 F (36.6 C)-99.6 F (37.6 C)] 99.1 F (37.3 C) (12/13 1132) Pulse Rate:  [75-80] 75 (12/13 1306) Resp:  [16-18] 18 (12/13 1132) BP: (129-178)/(82-104) 129/82 (12/13 1306) SpO2:  [95 %-97 %] 96 % (12/13 1306) Physical Exam Vitals and nursing note reviewed.  Constitutional:      General: She is not in acute distress.    Appearance: Normal appearance. She is not ill-appearing or toxic-appearing.  HENT:     Head: Normocephalic and atraumatic.  Pulmonary:     Effort: Pulmonary effort is normal.  Neurological:     Mental Status: She is alert.     Blood pressure 129/82, pulse 75, temperature 99.1 F (37.3 C), temperature source Oral, resp. rate 18, height 4\' 11"  (1.499 m), weight 57.7 kg, SpO2 96%. Body mass index is 25.69 kg/m.   Other History   These have been pulled in through the EMR, reviewed, and updated if appropriate.  Family History:  Daughter completed in Feb 2024 via intentional drug overdose. Per patient she expressed her intent to die.  The patient's family history is not on file.  Medical History: Past Medical History:  Diagnosis Date   Alcoholism (HCC)    Allergy    Depression    Hyperlipemia    Hypertension    Hypothyroid     Surgical History: Past Surgical History:  Procedure Laterality Date   BREAST  EXCISIONAL BIOPSY Left 2008   benign   COLONOSCOPY     fallopian ablation     HAND SURGERY Left 05/2015   MYOMECTOMY     x2   OOPHORECTOMY Right    POLYPECTOMY      Medications:   Current Facility-Administered Medications:    acetaminophen (TYLENOL) tablet 650 mg, 650 mg, Oral, Q6H PRN, 650 mg at 12/14/22 0601 **OR** acetaminophen (TYLENOL) suppository 650 mg, 650 mg, Rectal, Q6H PRN, Opyd, Lavone Neri, MD   [START ON 12/15/2022] amLODipine (NORVASC) tablet 5 mg, 5 mg, Oral, Daily, Pokhrel, Laxman, MD   cloNIDine (CATAPRES) tablet 0.1 mg, 0.1 mg, Oral, BID, Pokhrel, Laxman, MD, 0.1 mg at 12/14/22 1130   enoxaparin (LOVENOX) injection 40 mg, 40 mg, Subcutaneous, Q24H, Earl Many M, RPH, 40 mg at 12/13/22 2143   haloperidol lactate (HALDOL) injection 5 mg, 5 mg, Intravenous, Q6H PRN, Lewie Chamber, MD, 5 mg at 12/11/22 1632   LORazepam (ATIVAN) tablet 1 mg, 1 mg, Oral, Q6H PRN, Pokhrel, Laxman, MD   losartan (COZAAR) tablet 100 mg, 100 mg, Oral, Daily, Pokhrel, Laxman, MD   ondansetron (ZOFRAN) tablet 4 mg, 4 mg, Oral, Q6H PRN **OR** ondansetron (ZOFRAN) injection 4 mg, 4 mg, Intravenous, Q6H PRN, Opyd, Lavone Neri, MD   Oral care mouth rinse, 15 mL, Mouth Rinse, PRN, Girguis, David, MD   senna-docusate (Senokot-S) tablet 1 tablet, 1 tablet, Oral, QHS PRN, Opyd, Lavone Neri, MD   traZODone (DESYREL) tablet 25 mg, 25 mg, Oral, QHS PRN, Anthoney Harada, NP, 25 mg at 12/13/22 2142  Allergies: Allergies  Allergen Reactions   Cephalexin Rash   Codeine Anxiety and Other (See Comments)    Hallucinations and jittery  Hallucinations and jittery  Pt states "anxiety and out of body experience"

## 2022-12-14 NOTE — Care Management Important Message (Signed)
Important Message  Patient Details IM Letter given. Name: Melissa Wolfe MRN: [id number] Date of Birth: 12/29/1950   Important Message Given:  Yes - Medicare IM     Caren Macadam 12/14/2022, 10:31 AM

## 2022-12-14 NOTE — Progress Notes (Signed)
Physical Therapy Treatment Patient Details Name: CARINE GUILLET MRN: [id number] DOB: 03/03/1950 Today's Date: 12/14/2022   History of Present Illness 72 yo female presents to therapy following hospital admission on 12/09/2022 due to AMS x 4 days. Pt has hx of Etoch abuse since death of a child. Pt had presented to ED on 12/3 with L hip pain and d/c with prednisone. Recent UTI. Pt found to have acute encephalopathy, suicidal ideation and depression with fall on 12/9 OOB. Pt PMH includes but is not limited to: HTN, HLD, depression, sciatica, and substance abuse.    PT Comments  Pt admitted with above diagnosis.  Pt currently with functional limitations due to the deficits listed below (see PT Problem List). Pt in bed when PT arrived. Pt agreeable to therapy intervention. Pt appeared to be more alert and oriented from time of eval, however pt remains unable to provide clear insight to PLOF. Pt required S for supine to sit with hospital bed, CGA for sit to stand  from EOB, CGA for gait tasks with RW, CGA and recliner close with cues for safety, posture and proper distance from RW for 110 feet. Pt left seated in recliner, all needs in place and nurse tech/sitter in room. Pt will benefit from acute skilled PT to increase their independence and safety with mobility to allow discharge.   Bp semi reclined in bed 154/104 (77 PR) Bp seated EOB 173/101 (85 PR) Bp standing 172/113 (98 PR) pt reported mild dizziness with standing.  Bp mediation administered at beginning of therapy session.   If plan is discharge home, recommend the following: Assistance with cooking/housework;Assist for transportation;Help with stairs or ramp for entrance;Direct supervision/assist for financial management;Direct supervision/assist for medications management;Supervision due to cognitive status;A little help with walking and/or transfers;A little help with bathing/dressing/bathroom   Can travel by private vehicle     Yes   Equipment Recommendations  Other (comment) (TBD at next venue and unclear of DME in home setting)    Recommendations for Other Services       Precautions / Restrictions Precautions Precautions: Fall Restrictions Weight Bearing Restrictions Per Provider Order: No     Mobility  Bed Mobility Overal bed mobility: Needs Assistance Bed Mobility: Supine to Sit     Supine to sit: HOB elevated, Supervision, Used rails     General bed mobility comments: pt requires increased time cues and encouragement to complete task with increased IND    Transfers Overall transfer level: Needs assistance Equipment used: Rolling walker (2 wheels) Transfers: Sit to/from Stand Sit to Stand: Contact guard assist           General transfer comment: min cues, pull to stand. pt required cues for initial standing balance and B UE support at RW, pt indicated light headedness    Ambulation/Gait Ambulation/Gait assistance: Contact guard assist Gait Distance (Feet): 110 Feet Assistive device: Rolling walker (2 wheels) Gait Pattern/deviations: Step-to pattern, Shuffle, Trunk flexed Gait velocity: decreased     General Gait Details: pt required min cues for safety, direction and posture with gait tasks, especially with turns pt reported fatigue with gait today   Stairs             Wheelchair Mobility     Tilt Bed    Modified Rankin (Stroke Patients Only)       Balance Overall balance assessment: Needs assistance, History of Falls Sitting-balance support: Feet supported Sitting balance-Leahy Scale: Fair     Standing balance support: Reliant on assistive device  for balance, During functional activity, Bilateral upper extremity supported Standing balance-Leahy Scale: Poor                              Cognition Arousal: Alert Behavior During Therapy: Anxious Overall Cognitive Status: Impaired/Different from baseline Area of Impairment: Memory, Safety/judgement,  Problem solving                 Orientation Level: Time, Situation Current Attention Level: Focused Memory: Decreased recall of precautions, Decreased short-term memory Following Commands: Follows one step commands consistently Safety/Judgement: Decreased awareness of safety   Problem Solving: Difficulty sequencing, Requires verbal cues, Requires tactile cues General Comments: pt appeared to be more alert and oriented with tx session today, pt exhibited increased ease with following commands. pt remains unable to provide clarity with PLOF.  nurse tech indicated pt is fluccuating with cognition and demonstrated poor safety awareness this am        Exercises      General Comments        Pertinent Vitals/Pain Pain Assessment Pain Assessment: No/denies pain Faces Pain Scale: No hurt    Home Living                          Prior Function            PT Goals (current goals can now be found in the care plan section) Acute Rehab PT Goals PT Goal Formulation: Patient unable to participate in goal setting Progress towards PT goals: Progressing toward goals    Frequency    Min 1X/week      PT Plan      Co-evaluation              AM-PAC PT "6 Clicks" Mobility   Outcome Measure  Help needed turning from your back to your side while in a flat bed without using bedrails?: A Little Help needed moving from lying on your back to sitting on the side of a flat bed without using bedrails?: A Little Help needed moving to and from a bed to a chair (including a wheelchair)?: A Little Help needed standing up from a chair using your arms (e.g., wheelchair or bedside chair)?: A Lot Help needed to walk in hospital room?: Total Help needed climbing 3-5 steps with a railing? : Total 6 Click Score: 13    End of Session Equipment Utilized During Treatment: Gait belt Activity Tolerance: Patient limited by fatigue Patient left: with call bell/phone within  reach;with nursing/sitter in room;in chair Nurse Communication: Mobility status PT Visit Diagnosis: Unsteadiness on feet (R26.81);Muscle weakness (generalized) (M62.81);Repeated falls (R29.6);Difficulty in walking, not elsewhere classified (R26.2)     Time: 9147-8295 PT Time Calculation (min) (ACUTE ONLY): 21 min  Charges:    $Gait Training: 8-22 mins PT General Charges $$ ACUTE PT VISIT: 1 Visit                     Johnny Bridge, PT Acute Rehab   Jacqualyn Posey 12/14/2022, 2:04 PM

## 2022-12-15 DIAGNOSIS — G9340 Encephalopathy, unspecified: Secondary | ICD-10-CM | POA: Diagnosis not present

## 2022-12-15 LAB — BASIC METABOLIC PANEL
Anion gap: 4 — ABNORMAL LOW (ref 5–15)
BUN: 20 mg/dL (ref 8–23)
CO2: 22 mmol/L (ref 22–32)
Calcium: 9.2 mg/dL (ref 8.9–10.3)
Chloride: 109 mmol/L (ref 98–111)
Creatinine, Ser: 0.98 mg/dL (ref 0.44–1.00)
GFR, Estimated: >60 mL/min (ref >60)
Glucose, Bld: 105 mg/dL — ABNORMAL HIGH (ref 70–99)
Potassium: 4 mmol/L (ref 3.5–5.1)
Sodium: 135 mmol/L (ref 135–145)

## 2022-12-15 LAB — CULTURE, BLOOD (ROUTINE X 2)
Culture: NO GROWTH
Culture: NO GROWTH
Special Requests: ADEQUATE
Special Requests: ADEQUATE

## 2022-12-15 LAB — MAGNESIUM: Magnesium: 2 mg/dL (ref 1.7–2.4)

## 2022-12-15 MED ORDER — FLUOXETINE HCL 20 MG PO CAPS
20.0000 mg | ORAL_CAPSULE | Freq: Every day | ORAL | Status: DC
  Administered 2022-12-15 – 2022-12-17 (×3): 20 mg via ORAL
  Filled 2022-12-15 (×3): qty 1

## 2022-12-15 NOTE — Consult Note (Signed)
[name] Consult Evaluation  Service Date: December 15, 2022 LOS:  LOS: 5 days    Primary Psychiatric Diagnoses  Major Depression Disorder 2.  Adjustment Disorder 3.  Bereavement  Assessment  Melissa Wolfe is a 72 y.o. female admitted medically for 12/09/2022  1:14 PM for weakness, encephalopathy, and falls. They have a history of HTN, HLD, depression, alcohol use disorder reports last drink about 4 months ago. She was admitted with altered mental status after being found in the floor. Inpatient work up thus far shows negative EEG, + GNR currently taking cefepime. During the hospitalization she endorses suicidal ideations to nursing staff therefore psychiatric consult was placed.   Her current presentation of worsening depressive symptoms, hopelessness, worthlessness, guilty, isolation, suicidal thoughts, and anhedonia is most consistent with MDD, severe. She meets criteria for MDD based on the above symptoms that have been ongoing > 2 weeks. She reports her suicidal thoughts started immediately after the passing of her daughter in 09-Mar-2022 in 2024. She is taking outpatient medications that include Gabapentin and Ativan, UDS positive for BZD.  She reports taking an antidepressant, however chart review does not reflect history. On initial examination, patient is alert and oriented x 4, calm and cooperative, attentive, and engages well with this provider. She reports worsening depressive symptoms since the untimely suicide of her daughter. She expresses that her daughter was her only child, and left a grandson age 20 who resides with his father. She reports she is the primary caretaker of her mother age 8, and those are the only reason she has not attempted. She states " I don't know how I am going to go on life without her. " She states her suicidal thoughts were passive until about 3 months ago when she actual developed a plan to jump off a bridge. Prior to her daughters death she  denies any psychiatric history, inpatient psychiatric admissions, outpatient services or psychotropic medications. She denies any grief or counseling services following her death. She denies any history of suicide attempts, non suicidal self injurious behaviors. Please see plan below for detailed recommendations.    12/13: Patient continues to be depressed and tearful during assessment.  She describes her ongoing grief from her daughter that died of suicide 2022-03-09.  She is future oriented recognizing that she needs to care for her 60 year old mother with whom she leaves (she does have nursing assistance that comes in 8 hours a day, and her older brother lives across the street).  She also reports a good relationship with her 74 year old grandson who is now currently living with his father since her daughter's passing.  Patient recognizes that she had become too weak to even use her walker and then developed a "bad mindset" during which time she allowed herself to become even more depressed, was drinking alcohol, and had been crawling in her home.  She had sustained a fall injury, and found it was safer to crawl into re-injure herself.  Patient continues to have suicidal ideation. She continues to meet criteria for inpatient psychiatric hospitalization.  She had been accepted to [facility] psych, unfortunately due to a facility problem that is no longer available.  Will plan to seek inpatient psychiatric admission at outside facility. These recommendations have been communicated with the primary team.  12/14: Patient is awake, alert, and oriented. She denies any suicidal ideations for past 24 hours (this conflicts w/ Dr. Tonita Phoenix note). Per nursing staff she denied on their assessments yesterday. No SI,  HI, AH/VH today; amenable to restarting prozac. Pt reports thinking she was on it already - discussed she filled it in August and likely ran out mid-Nov (shortly after which she became depressed and  suicidal although she has given different providers different timelines on this). Discussed it was likely somewhat effective given timeline. Amenable to starting prozac and remains amenable to inpt psychiatry although pt would like to start safety planning if continued improvement.    Diagnoses:  Active Hospital problems: Principal Problem:   Acute encephalopathy Active Problems:   Alcohol dependence (HCC)   Depression, major, recurrent, moderate (HCC)   Hypothyroid   Hypertension   CKD stage 3a, GFR 45-59 ml/min (HCC)   Hypokalemia   Positive blood culture   Fall     Plan   ## Psychiatric Medication Recommendations:  -- s fluoxetine 20 mg every day (prev effective)   ## Medical Decision Making Capacity:  Not formally assessed but appears to have capacity. There does not appear to be any acute psychiatric condition or encephalopathy that will prohibit decision making at this time.   ## Further Work-up:  --  TSH, B12, folate, EKG, or While pt on Qtc prolonging medications, please monitor & replete K+ to 4 and Mg2+ to 2   -- most recent EKG on 12/8 had QtC of 466 -- Pertinent labwork reviewed earlier this admission includes: Hypokalemia 3.2, TSH 0.031, T3 1.7, Free T4 1.55, Urinalysis Small leukocytes. UDS positive for BZD and THC. (Synthroid being held for a few days)  -Recommend PT/OT to increase physical strength and prevent further deconditioning Will defer hyperthyroidism to primary team- agree with rec to hold Synthroid.  ## Disposition:  -- We recommend inpatient psychiatric hospitalization when medically cleared. Patient is under voluntary admission status at this time; please IVC if attempts to leave hospital. She has been accepted to [facility].  ## Behavioral / Environmental:  --  Utilize compassion and acknowledge the patient's experiences while setting clear and realistic expectations for care.   ## Safety and Observation Level:  - Based on my clinical evaluation, I  estimate the patient to be at moderate risk of self harm in the current setting.  - At this time, we recommend a 1:1 level of observation. This decision is based on my review of the chart including patient's history and current presentation, interview of the patient, mental status examination, and consideration of suicide risk including evaluating suicidal ideation, plan, intent, suicidal or self-harm behaviors, risk factors, and protective factors. This judgment is based on our ability to directly address suicide risk, implement suicide prevention strategies and develop a safety plan while the patient is in the clinical setting. Please contact our team if there is a concern that risk level has changed.  Suicide risk assessment  Patient has following modifiable risk factors for suicide: untreated depression, social isolation, and lack of access to outpatient mental health resources, which we are addressing by inpatient psychiatric admission, medication management, 1:1 safety sitter.   Patient has following non-modifiable or demographic risk factors for suicide: family hx of suicide completion.   Patient has the following protective factors against suicide: Access to outpatient mental health care, Supportive family, Supportive friends, Cultural, spiritual, or religious beliefs that discourage suicide, Frustration tolerance, no history of suicide attempts, and no history of NSSIB Grandson (16). Willing to seek inpatient treatment.    Thank you for this consult request. Recommendations have been communicated to the primary team.  We will continue to follow at this time  until she transfers to a higher level of care with inpatient psychiatry.  Gemini Beaumier A Andriel Omalley  Psychiatric and Social History   Relevant Aspects of Hospital Course:  Admitted on 12/09/2022 for weakness, encephalopathy and falls. They have a history of HTN, HLD, depression, alcohol use disorder reports last drink about 4 months ago. She  was admitted with altered mental status after being found in the floor. Inpatient work up thus far shows negative EEG, + GNR currently taking cefepime. During the hospitalization she endorses suicidal ideations to nursing staff therefore psychiatric consult was placed.    Patient Report:  Denied SI to physician team today. Affect fairly bright. Sleep and appetite have been fair.  She denies AVH, HI.   Discussed daughter's death as suicide attempt and how her suicide was OK because "everyone around her accepted it and was OK with it" (?), how she now knows her grandson would be upset if she passed.    Psych ROS:  12/13: She continues to have severe depression.  She continues to endorse SI with a plan, means, and access (jump from bridge).   Collateral information:  Collateral not obtained at this time. Patient good historian, openly admitting suicidal thoughts with intent and willing to come into the hospital. Will need eventually but will defer at this time.   Psychiatric History:  Information collected from patient and chart reviewed.   Prev Dx/Sx: Major Depressive Disorder diagnosed after daughter completed.  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  Spiritual Hx: non denominational Access to weapons: Denies  Substance History Tobacco use: Denies Alcohol use: Hx of alcohol use disorder. Tells me her last drink about 4 months ago.  Drug use: Denies, UDS positive for THC and BZD(active rx).    Exam Findings   Psychiatric  Specialty Exam:  Presentation  General Appearance: Appropriate for Environment; Casual  Eye Contact:Good  Speech:Clear and Coherent  Speech Volume:Decreased  Handedness:Right   Mood and Affect  Mood:-- (feeling better today)  Affect:Congruent   Thought Process  Thought Processes:Coherent; Linear  Descriptions of Associations:Intact  Orientation:Full (Time, Place and Person)  Thought Content:Logical  Hallucinations:Hallucinations: None   Ideas of Reference:None  Suicidal Thoughts:Suicidal Thoughts: Yes, Passive   Homicidal Thoughts:Homicidal Thoughts: No    Sensorium  Memory:Immediate Good; Recent Good; Remote Good  Judgment:Fair  Insight:Fair   Executive Functions  Concentration:Good  Attention Span:Good  Recall:Good  Fund of Knowledge:Good  Language:Good   Psychomotor Activity  Psychomotor Activity:Psychomotor Activity: Normal    Assets  Assets:Communication Skills; Desire for Improvement; Financial Resources/Insurance; Housing   Sleep  Sleep:Sleep: Fair     Physical Exam: Vital signs:  Temp:  [98.2 F (36.8 C)-99.1 F (37.3 C)] 98.5 F (36.9 C) (12/14 1016) Pulse Rate:  [74-96] 88 (12/14 1016) Resp:  [18] 18 (12/14 0623) BP: (125-154)/(77-104) 125/77 (12/14 1016) SpO2:  [96 %-99 %] 99 % (12/14 1610) Physical Exam Vitals and nursing note reviewed.  Constitutional:      General: She is not in acute distress.    Appearance: Normal appearance. She is not ill-appearing or toxic-appearing.  HENT:  Head: Normocephalic and atraumatic.  Pulmonary:     Effort: Pulmonary effort is normal.  Neurological:     Mental Status: She is alert.     Blood pressure 125/77, pulse 88, temperature 98.5 F (36.9 C), temperature source Oral, resp. rate 18, height 4\' 11"  (1.499 m), weight 57.7 kg, SpO2 99%. Body mass index is 25.69 kg/m.   Other History   These have been pulled in through the EMR, reviewed, and updated if appropriate.    Family History:  Daughter completed in Feb 2024 via intentional drug overdose. Per patient she expressed her intent to die.  The patient's family history is not on file.  Medical History: Past Medical History:  Diagnosis Date  . Alcoholism (HCC)   . Allergy   . Depression   . Hyperlipemia   . Hypertension   . Hypothyroid     Surgical History: Past Surgical History:  Procedure Laterality Date  . BREAST EXCISIONAL BIOPSY Left 2008   benign  . COLONOSCOPY    . fallopian ablation    . HAND SURGERY Left 05/2015  . MYOMECTOMY     x2  . OOPHORECTOMY Right   . POLYPECTOMY      Medications:   Current Facility-Administered Medications:  .  acetaminophen (TYLENOL) tablet 650 mg, 650 mg, Oral, Q6H PRN, 650 mg at 12/14/22 0601 **OR** acetaminophen (TYLENOL) suppository 650 mg, 650 mg, Rectal, Q6H PRN, Opyd, Timothy S, MD .  amLODipine (NORVASC) tablet 5 mg, 5 mg, Oral, Daily, Pokhrel, Laxman, MD, 5 mg at 12/15/22 1023 .  cloNIDine (CATAPRES) tablet 0.1 mg, 0.1 mg, Oral, BID, Pokhrel, Laxman, MD, 0.1 mg at 12/15/22 1023 .  enoxaparin (LOVENOX) injection 40 mg, 40 mg, Subcutaneous, Q24H, Hessie Knows, [place], 40 mg at 12/14/22 2119 .  FLUoxetine (PROZAC) capsule 20 mg, 20 mg, Oral, Daily, Aluna Whiston A .  haloperidol lactate (HALDOL) injection 5 mg, 5 mg, Intravenous, Q6H PRN, Lewie Chamber, MD, 5 mg at 12/11/22 1632 .  LORazepam (ATIVAN) tablet 1 mg, 1 mg, Oral, Q6H PRN, Pokhrel, Laxman, MD .  losartan (COZAAR) tablet 100 mg, 100 mg, Oral, Daily, Pokhrel, Laxman, MD .  multivitamins with iron tablet 1 tablet, 1 tablet, Oral, QHS, Mariel Craft, MD .  ondansetron [facility]) tablet 4 mg, 4 mg, Oral, Q6H PRN **OR** ondansetron (ZOFRAN) injection 4 mg, 4 mg, Intravenous, Q6H PRN, Opyd, Lavone Neri, MD .  Oral care mouth rinse, 15 mL, Mouth Rinse, PRN, Lewie Chamber, MD .  senna-docusate (Senokot-S) tablet 1 tablet, 1 tablet, Oral, QHS PRN, Opyd, Lavone Neri, MD .  traZODone  (DESYREL) tablet 25 mg, 25 mg, Oral, QHS PRN, Anthoney Harada, NP, 25 mg at 12/14/22 2117  Allergies: Allergies  Allergen Reactions  . Cephalexin Rash  . Codeine Anxiety and Other (See Comments)    Hallucinations and jittery  Hallucinations and jittery  Pt states "anxiety and out of body experience"

## 2022-12-15 NOTE — Plan of Care (Signed)

## 2022-12-15 NOTE — Progress Notes (Signed)
LCSW Progress Note  [id number]   TALINE OLAH  12/15/2022  11:39 AM    Inpatient Behavioral Health Placement  Pt meets inpatient criteria per Claris Che Cinderella,DO. There are no available beds within [facility] per [facility] AC Brook McNichol,RN. Referral was sent to the following facilities;   Destination  Service Provider Address Phone [facility] [place] [address] [place], [place] [address] [phone]  [facility] [address] [place] [address] [phone]  [facility] Patient Placement [facility], [place] [phone]  [facility] [address] [place], [place] [address] [phone]  [facility] [address], [place] [address] [phone]  [facility] [address], [place] [address] [phone]  [facility] [address] [place] [address] [phone]  [facility] [address] [place] [address] [phone]  [facility] [address] [place] [address] [phone]  [facility] [address] [place] [address] [phone]  [facility] [address] [place] [address] [phone]  [facility] [address], [place] [address] [phone]  [facility] [place] [phone]  [facility] [address] [place] [address] [phone]  [facility] EFAX [address] [place] [phone]  [facility] [address],  [place] [address] [phone]  [facility] [address] [place] [address] [phone]  [facility] [address], [place] [address] [phone]  [facility] [address] [place] [address] [phone]  [facility] [address], [place] [address] [phone]  [facility] [address] [place], [place] [address] [phone]  [facility] [address], [place] [address] [phone]  [facility] Inpatient [facility] [place] [phone]  [facility] [address], [place] [address] [phone]  [facility] [address], [place] [address] [phone]  [facility] [address] [place] [address] [phone]  [facility] [address] [place] [address] [phone]  [facility] [address] [place] [address] [phone]    Situation ongoing,  CSW will follow up.    Maryjean Ka, MSW, [facility] 12/15/2022 11:39 AM

## 2022-12-15 NOTE — Progress Notes (Signed)
PROGRESS NOTE    Melissa Wolfe  OZH:[id number] DOB: 1950-01-03 DOA: 12/09/2022 PCP: Creola Corn, MD    Brief Narrative:   Ms. Melissa Wolfe is a 72 year old female with past medical history of hypertension, hyperlipidemia, hypothyroidism  depression, alcohol abuse disorder hospital with altered mental status.  Of note patient was recently seen in the ED on 12/04/2022 for left hip pain and was prescribed prednisone.  Since that time she had been having worsening confusion and irritability.  There was some concern regarding patient consuming alcohol as well.  In the ED, patient was afebrile without any leukocytosis.  Urinalysis was negative.  Blood cultures were obtained.  Patient was then admitted hospital for further evaluation and treatment.    Assessment and Plan:  * Acute encephalopathy Unknown etiology.  Could be secondary to infectious/substance abuse/medication effect.  Patient is on Ativan and gabapentin.  Possibility of side effect with underlying decreased renal function.  Urine drug screen with benzos and THC.  Monitor for signs of alcohol withdrawal.  Noted to be on the floor on 12/19/2022.  EEG was done without any signs of seizure-like activity.  Currently resolved.    Fall on the floor. On 12/11/2022 no external  injury.  EEG without signs of seizure.  Has been seen by PT and recommended skilled nursing facility placement.  Positive blood culture Polymicrobial gram-positive species including Staph hominis, capitis, epidermidis from blood culture of 12/09/2022.Marland Kitchen Repeat blood culture on 12/ 9 negative so far. Marland Kitchen  Spoke with infectious disease and likely contaminant.  Not on antibiotics.   Depression, major, recurrent, moderate (HCC) Had some suicidal ideation 12/11/2022 with expression of wanting to jump off the bridge.  Currently on one-to-one sitter.     Psychiatry on board and plan for Geri psych unit admission on discharge.  CKD stage 3a, GFR 45-59 ml/min (HCC) Previous labs  from 2016 with baseline creatinine around 1.2 and GFR ~ 40.  Creatinine at this time at 0.9.   Hypokalemia Improved after replacement.   Hypertension Continue amlodipine clonidine losartan.  Potassium okay today.  Blood pressure still elevated.  HCTZ still on hold will resume starting today.  Increase dose of amlodipine to 5 mg daily.   Hypothyroidism - TSH suppressed, 0.031 with free T4 elevated at 1.5.  Synthroid on hold at this time.  Plan to reassess thyroid function again before reinitiating in few days.    Alcohol use disorder. On as needed Ativan on discharge.  Debility, deconditioning.  Recent fall..  PT recommending skilled nursing facility placement.     DVT prophylaxis: enoxaparin (LOVENOX) injection 40 mg Start: 12/13/22 2200   Code Status:     Code Status: Full Code  Disposition: To Geri psych unit when bed available.  TOC on board.  Medically stable for disposition.  Status is: Inpatient Remains inpatient appropriate because: , awaiting for placement.   Family Communication: None at bedside  Consultants:  Psychiatry  Procedures:  EEG  Antimicrobials:  None  Subjective: Today, patient was seen and examined at bedside.  Denies any chest pain, shortness of breath, fever, chills or rigor.  Mild leg pain.  Objective: Vitals:   12/15/22 0623 12/15/22 1016 12/15/22 1116 12/15/22 1219  BP: (!) 140/92 125/77 121/76 91/64  Pulse: 96 88 89 80  Resp: 18     Temp: 98.8 F (37.1 C) 98.5 F (36.9 C)    TempSrc: Oral Oral    SpO2: 99%  100%   Weight:      Height:  No intake or output data in the 24 hours ending 12/15/22 1435  Filed Weights   12/09/22 2258  Weight: 57.7 kg    Physical Examination: Body mass index is 25.69 kg/m.   General:  Average built, not in obvious distress, on room air. HENT:   No scleral pallor or icterus noted. Oral mucosa is moist.  Chest:  Clear breath sounds.  No crackles or wheezes.  CVS: S1 &S2 heard. No murmur.   Regular rate and rhythm. Abdomen: Soft, nontender, nondistended.  Bowel sounds are heard.   Extremities: No cyanosis, clubbing or edema.  Peripheral pulses are palpable. Psych: Alert, awake and Communicative. CNS:  No cranial nerve deficits.  Moves all extremities, generalized weakness noted. Skin: Warm and dry.  No rashes noted.  Data Reviewed:   CBC: Recent Labs  Lab 12/09/22 1347 12/10/22 0753 12/11/22 0802 12/12/22 0732 12/13/22 0602 12/14/22 0908  WBC 8.0 7.0 6.7 6.2 7.1 8.1  NEUTROABS 5.1  --  4.0 3.4 4.1 4.8  HGB 13.5 12.8 12.4 12.1 12.6 13.2  HCT 40.2 38.7 37.3 35.7* 37.5 39.6  MCV 93.5 94.4 93.5 92.2 92.6 91.9  PLT 235 243 214 218 228 263    Basic Metabolic Panel: Recent Labs  Lab 12/10/22 0753 12/11/22 0802 12/12/22 0732 12/13/22 0602 12/14/22 0908 12/15/22 0800  NA 139 141 138 138  --  135  K 4.0 3.5 3.2* 3.7  --  4.0  CL 109 109 112* 109  --  109  CO2 22 22 20* 22  --  22  GLUCOSE 101* 100* 94 109*  --  105*  BUN 47* 37* 25* 18  --  20  CREATININE 1.35* 1.15* 0.93 0.75  --  0.98  CALCIUM 8.8* 9.2 8.8* 9.0  --  9.2  MG 2.1 2.1 1.9 1.9 1.9 2.0  PHOS 2.9  --   --   --   --   --     Liver Function Tests: Recent Labs  Lab 12/09/22 1347  AST 25  ALT 19  ALKPHOS 92  BILITOT 1.0  PROT 6.8  ALBUMIN 3.5     Radiology Studies: No results found.    LOS: 5 days     Joycelyn Das, MD [facility] Available via Epic secure chat 7am-7pm After these hours, please refer to coverage provider listed on [email or web address] 12/15/2022, 2:35 PM

## 2022-12-15 NOTE — Progress Notes (Signed)
Pt calm resting in bed  no acute distress noted  safety measures remain in place  call bell within reach   sitter at bedside   handoff completed with [employer]

## 2022-12-15 NOTE — Progress Notes (Signed)
Mobility Specialist - Progress Note   12/15/22 1106  Mobility  Activity Ambulated with assistance in hallway;Ambulated with assistance to bathroom  Level of Assistance Standby assist, set-up cues, supervision of patient - no hands on  Assistive Device Front wheel walker  Distance Ambulated (ft) 120 ft  Activity Response Tolerated well  Mobility Referral Yes  Mobility visit 1 Mobility  Mobility Specialist Start Time (ACUTE ONLY) 1045  Mobility Specialist Stop Time (ACUTE ONLY) 1105  Mobility Specialist Time Calculation (min) (ACUTE ONLY) 20 min   Pt received in bed and agreeable to mobility. No complaints during session. Pt to bathroom after session with all needs met. Sitter in room.   [facility]

## 2022-12-16 DIAGNOSIS — G9340 Encephalopathy, unspecified: Secondary | ICD-10-CM | POA: Diagnosis not present

## 2022-12-16 MED ORDER — FLUTICASONE PROPIONATE 50 MCG/ACT NA SUSP
1.0000 | Freq: Every day | NASAL | Status: DC
  Administered 2022-12-16 – 2022-12-17 (×2): 1 via NASAL
  Filled 2022-12-16: qty 16

## 2022-12-16 MED ORDER — AMLODIPINE BESYLATE 5 MG PO TABS
2.5000 mg | ORAL_TABLET | Freq: Every day | ORAL | Status: DC
  Administered 2022-12-17: 2.5 mg via ORAL
  Filled 2022-12-16: qty 1

## 2022-12-16 NOTE — Progress Notes (Signed)
PROGRESS NOTE    Melissa Wolfe  XBM:[id number] DOB: Aug 06, 1950 DOA: 12/09/2022 PCP: Creola Corn, MD    Brief Narrative:   Ms. Wensel is a 72 year old female with past medical history of hypertension, hyperlipidemia, hypothyroidism  depression, alcohol abuse disorder hospital with altered mental status.  Of note patient was recently seen in the ED on 12/04/2022 for left hip pain and was prescribed prednisone.  Since that time she had been having worsening confusion and irritability.  There was some concern regarding patient consuming alcohol as well.  In the ED, patient was afebrile without any leukocytosis.  Urinalysis was negative.  Blood cultures were obtained.  Patient was then admitted hospital for further evaluation and treatment.    Assessment and Plan:  * Acute encephalopathy Likely secondary to infectious/substance abuse/medication effect.  Patient was on Ativan and gabapentin.  Possibility of side effect with underlying decreased renal function.  Urine drug screen with benzos and THC.  Marland Kitchen  Noted to be on the floor on 12/19/2022.  EEG was done without any signs of seizure-like activity.  Currently resolved.  No signs of withdrawal.  Fall on the floor. On 12/11/2022 no external  injury.  EEG without signs of seizure.  Has been seen by PT and recommended skilled nursing facility placement.  Positive blood culture Polymicrobial gram-positive species including Staph hominis, capitis, epidermidis from blood culture of 12/09/2022.Marland Kitchen Repeat blood culture on 12/ 9 negative so far.   Spoke with infectious disease and likely contaminant.  Not on antibiotics.   Depression, major, recurrent, moderate (HCC) Had some suicidal ideation 12/11/2022 with expression of wanting to jump off the bridge.  Currently on one-to-one sitter.     Psychiatry on board and plan for Geri psych unit admission on discharge.  Currently awaiting for disposition.  CKD stage 3a, GFR 45-59 ml/min (HCC) Previous labs  from 2016 with baseline creatinine around 1.2 and GFR ~ 40.  Creatinine at this time at 0.9.   Hypokalemia Improved after replacement.  Check BMP in AM.   Hypertension Continue amlodipine clonidine losartan holding parameters..  Hypothyroidism - TSH suppressed, 0.031 with free T4 elevated at 1.5.  Synthroid on hold at this time.  Plan to reassess thyroid function again before reinitiating in few days.    Alcohol use disorder. On as needed Ativan on discharge.  Debility, deconditioning.  Recent fall..  PT recommending skilled nursing facility placement.     DVT prophylaxis: enoxaparin (LOVENOX) injection 40 mg Start: 12/13/22 2200   Code Status:     Code Status: Full Code  Disposition: To Geri psych unit when bed available.  TOC on board.  Medically stable for disposition.  Status is: Inpatient  Remains inpatient appropriate because: , awaiting for placement.   Family Communication: None at bedside  Consultants:  Psychiatry  Procedures:  EEG  Antimicrobials:  None  Subjective: Today, patient was seen and examined at bedside.  Complains  of mild stuffy nose and cough.  Denies any chest pain, nausea vomiting fever or chills.  Sitter at bedside.  Objective: Vitals:   12/15/22 1219 12/15/22 1607 12/15/22 2233 12/16/22 0707  BP: 91/64 100/67 126/78 126/87  Pulse:  79 75 77  Resp:   16 14  Temp:  98.3 F (36.8 C) (!) 97.5 F (36.4 C) 98 F (36.7 C)  TempSrc:   Oral Oral  SpO2:  98% 97% 97%  Weight:      Height:        Intake/Output Summary (Last 24 hours)  at 12/16/2022 0931 Last data filed at 12/16/2022 0306 Gross per 24 hour  Intake 120 ml  Output --  Net 120 ml    Filed Weights   12/09/22 2258  Weight: 57.7 kg    Physical Examination: Body mass index is 25.69 kg/m.   General:  Average built, not in obvious distress, on room air. HENT:   No scleral pallor or icterus noted. Oral mucosa is moist.  Chest:  Clear breath sounds.  No crackles or  wheezes.  CVS: S1 &S2 heard. No murmur.  Regular rate and rhythm. Abdomen: Soft, nontender, nondistended.  Bowel sounds are heard.   Extremities: No cyanosis, clubbing or edema.  Peripheral pulses are palpable. Psych: Alert, awake and Communicative. CNS:  No cranial nerve deficits.  Power equal in all extremities. Skin: Warm and dry.  No rashes noted.  Data Reviewed:   CBC: Recent Labs  Lab 12/09/22 1347 12/10/22 0753 12/11/22 0802 12/12/22 0732 12/13/22 0602 12/14/22 0908  WBC 8.0 7.0 6.7 6.2 7.1 8.1  NEUTROABS 5.1  --  4.0 3.4 4.1 4.8  HGB 13.5 12.8 12.4 12.1 12.6 13.2  HCT 40.2 38.7 37.3 35.7* 37.5 39.6  MCV 93.5 94.4 93.5 92.2 92.6 91.9  PLT 235 243 214 218 228 263    Basic Metabolic Panel: Recent Labs  Lab 12/10/22 0753 12/11/22 0802 12/12/22 0732 12/13/22 0602 12/14/22 0908 12/15/22 0800  NA 139 141 138 138  --  135  K 4.0 3.5 3.2* 3.7  --  4.0  CL 109 109 112* 109  --  109  CO2 22 22 20* 22  --  22  GLUCOSE 101* 100* 94 109*  --  105*  BUN 47* 37* 25* 18  --  20  CREATININE 1.35* 1.15* 0.93 0.75  --  0.98  CALCIUM 8.8* 9.2 8.8* 9.0  --  9.2  MG 2.1 2.1 1.9 1.9 1.9 2.0  PHOS 2.9  --   --   --   --   --     Liver Function Tests: Recent Labs  Lab 12/09/22 1347  AST 25  ALT 19  ALKPHOS 92  BILITOT 1.0  PROT 6.8  ALBUMIN 3.5     Radiology Studies: No results found.    LOS: 6 days     Joycelyn Das, MD [facility] Available via Epic secure chat 7am-7pm After these hours, please refer to coverage provider listed on [email or web address] 12/16/2022, 9:31 AM

## 2022-12-16 NOTE — Plan of Care (Signed)
  Problem: Education: Goal: Knowledge of General Education information will improve Description: Including pain rating scale, medication(s)/side effects and non-pharmacologic comfort measures Outcome: Progressing   Problem: Coping: Goal: Level of anxiety will decrease Outcome: Progressing   Problem: Safety: Goal: Ability to remain free from injury will improve Outcome: Progressing   

## 2022-12-16 NOTE — Progress Notes (Signed)
LCSW Progress Note  [id number]   Melissa Wolfe  12/16/2022  2:16 PM  Description:   Inpatient Psychiatric Referral  Patient was recommended inpatient per Margaretha Seeds MD. There are no available beds at [facility], per [facility] [place] Malva Limes RN Patient was referred to the following out of network facilities: Destination  Service Provider Address Phone Fax  [facility] [place] [address] [place], [place] [address] [phone]  [facility] [address], [place] [address] [phone]  [facility] Patient Placement [facility], [place] [phone]  [facility] [address] [place], [place] [address] [phone]  [facility] [place] [address] [place] [address] [phone]  [facility] [address] [place], [place] [address] [phone]  [facility] [address] [place] [address] [phone]  [facility] [address] [place] [address] [phone]  [facility] [address] [place] [address] [phone]  [facility] [address], [place] [address] [phone]  [facility] [address], [place] [address] [phone]  [facility] [address] [place] [address] [phone]  [facility] [place] [phone]  [facility] [address] [place] [address] [phone]  [facility] EFAX [address] [place]s [place] [phone]  [facility] [address],  [place] [address] [phone]  [facility] [address] [place] [address] [phone]  [facility] [address], [place] [address] [phone]  [facility] [address], [place] [address] [phone]  [facility] [address], [place] [address] [phone]  [facility] [address] [place], [place] [address] [phone]  [facility] [address] [place] [address] [phone]  [facility] [place] [phone]  [facility] [address] [place] [address] [phone]  [facility] [address], [place] [address] [phone]  [facility] [address] [place] [address] [phone]  [facility] [address], [place] [address] [phone]  [facility] [address] [place] [address] [phone]      Situation ongoing, CSW to continue following and update chart as more information becomes available.      [place] Keghan Mcfarren, MSW, LCSW  12/16/2022 2:16 PM

## 2022-12-17 DIAGNOSIS — N3000 Acute cystitis without hematuria: Secondary | ICD-10-CM

## 2022-12-17 DIAGNOSIS — G9340 Encephalopathy, unspecified: Secondary | ICD-10-CM | POA: Diagnosis not present

## 2022-12-17 DIAGNOSIS — G9341 Metabolic encephalopathy: Secondary | ICD-10-CM

## 2022-12-17 MED ORDER — FLUOXETINE HCL 20 MG PO CAPS: 20.0000 mg | ORAL_CAPSULE | Freq: Every day | ORAL | 3 refills | Status: AC

## 2022-12-17 MED ORDER — LEVOTHYROXINE SODIUM 100 MCG PO TABS: 100.0000 ug | ORAL_TABLET | Freq: Every day | ORAL | 3 refills | Status: AC

## 2022-12-17 NOTE — Progress Notes (Signed)
Physical Therapy Treatment Patient Details Name: Melissa Wolfe MRN: [id number] DOB: [birth year] Today's Date: 12/17/2022   History of Present Illness 72 yo female presents to therapy following hospital admission on 12/09/2022 due to AMS x 4 days. Pt has hx of Etoch abuse since death of a child. Pt had presented to ED on 12/3 with L hip pain and d/c with prednisone. Recent UTI. Pt found to have acute encephalopathy, suicidal ideation and depression with fall on 12/9 OOB. Pt PMH includes but is not limited to: HTN, HLD, depression, sciatica, and substance abuse.    PT Comments   Pt admitted with above diagnosis.  Pt currently with functional limitations due to the deficits listed below (see PT Problem List). Pt seated in recliner when PT arrived. Pt agreeable to therapy intervention, pt stated reported she was going to go home with her mom and her grandson today. Per MD note pt is adamantly declining SNF setting at this time. Pt is requesting RW and to d/c with [facility] services. Pt is CGA for transfer tasks with reporting B LE distal pain and discomfort with standing and required 3 trials to progress with gait tasks. Gait limited today due to pt reporting she did not think her legs would hold her up, 60 feet RW and CGA with cues. Pt left seated in recliner, all needs in place and set up for lunch. Pt will require 24/7 supervision and assist in the next venue with ongoing therapy services--SNF vs home with family support and HH.  Pt will benefit from acute skilled PT to increase their independence and safety with mobility to allow discharge.      If plan is discharge home, recommend the following: Assistance with cooking/housework;Assist for transportation;Help with stairs or ramp for entrance;Direct supervision/assist for financial management;Direct supervision/assist for medications management;Supervision due to cognitive status;A little help with walking and/or transfers;A little help with  bathing/dressing/bathroom   Can travel by private vehicle     Yes  Equipment Recommendations  Rolling walker (2 wheels) (youth RW)    Recommendations for Other Services       Precautions / Restrictions Precautions Precautions: Fall Restrictions Weight Bearing Restrictions Per Provider Order: No     Mobility  Bed Mobility               General bed mobility comments: pt seated in recliner when PT arrived, noted significant sacral sit, pt able to reposition with cues    Transfers Overall transfer level: Needs assistance Equipment used: Rolling walker (2 wheels) Transfers: Sit to/from Stand Sit to Stand: Contact guard assist           General transfer comment: min cues for safety and proper UE and AD placement, pt performed sit to stand and then reported B posterior distal LE pain and stated she did not think her legs were ready for standing and that is why she was crawling at home    Ambulation/Gait Ambulation/Gait assistance: Contact guard assist Gait Distance (Feet): 60 Feet Assistive device: Rolling walker (2 wheels) Gait Pattern/deviations: Step-to pattern, Shuffle, Trunk flexed Gait velocity: decreased     General Gait Details: pt required min cues for safety, direction and posture with gait tasks, reported fatigue with gait and stated her legs could not hold her up and pt then made attempts for trunk flexion and placing forearms on RW, PT redirected pt for proper UE placement and gait distance limited today   Stairs  Wheelchair Mobility     Tilt Bed    Modified Rankin (Stroke Patients Only)       Balance Overall balance assessment: Needs assistance, History of Falls Sitting-balance support: Feet supported Sitting balance-Leahy Scale: Fair     Standing balance support: Reliant on assistive device for balance, During functional activity, Bilateral upper extremity supported Standing balance-Leahy Scale: Poor                               Cognition Arousal: Alert Behavior During Therapy: Anxious Overall Cognitive Status: Impaired/Different from baseline Area of Impairment: Memory, Safety/judgement, Problem solving                 Orientation Level: Time, Situation Current Attention Level: Focused Memory: Decreased recall of precautions, Decreased short-term memory Following Commands: Follows one step commands consistently Safety/Judgement: Decreased awareness of safety, Decreased awareness of deficits   Problem Solving: Difficulty sequencing, Requires verbal cues, Requires tactile cues          Exercises      General Comments        Pertinent Vitals/Pain Pain Assessment Pain Assessment: No/denies pain Faces Pain Scale: Hurts a little bit Pain Location: B posterior distal LE Pain Descriptors / Indicators: Discomfort, Grimacing, Guarding Pain Intervention(s): Limited activity within patient's tolerance, Monitored during session, Repositioned    Home Living                          Prior Function            PT Goals (current goals can now be found in the care plan section) Acute Rehab PT Goals PT Goal Formulation: Patient unable to participate in goal setting Progress towards PT goals: Progressing toward goals    Frequency    Min 1X/week      PT Plan      Co-evaluation              AM-PAC PT "6 Clicks" Mobility   Outcome Measure  Help needed turning from your back to your side while in a flat bed without using bedrails?: A Little Help needed moving from lying on your back to sitting on the side of a flat bed without using bedrails?: A Little Help needed moving to and from a bed to a chair (including a wheelchair)?: A Little Help needed standing up from a chair using your arms (e.g., wheelchair or bedside chair)?: A Little Help needed to walk in hospital room?: A Little Help needed climbing 3-5 steps with a railing? : Total 6 Click Score:  16    End of Session Equipment Utilized During Treatment: Gait belt Activity Tolerance: Patient limited by fatigue Patient left: with call bell/phone within reach;in chair Nurse Communication: Mobility status PT Visit Diagnosis: Unsteadiness on feet (R26.81);Muscle weakness (generalized) (M62.81);Repeated falls (R29.6);Difficulty in walking, not elsewhere classified (R26.2) Pain - Right/Left:  (B) Pain - part of body: Leg     Time: 1212-1224 PT Time Calculation (min) (ACUTE ONLY): 12 min  Charges:    $Gait Training: 8-22 mins PT General Charges $$ ACUTE PT VISIT: 1 Visit                    Johnny Bridge, PT Acute Rehab    Jacqualyn Posey 12/17/2022, 3:18 PM

## 2022-12-17 NOTE — Consult Note (Addendum)
[name] Consult Evaluation  Service Date: December 17, 2022 LOS:  LOS: 7 days    Primary Psychiatric Diagnoses  Major Depression Disorder 2.  Adjustment Disorder 3.  Bereavement  Assessment  Melissa Wolfe is a 72 y.o. female admitted medically for 12/09/2022  1:14 PM for weakness, encephalopathy, and falls. They have a history of HTN, HLD, depression, alcohol use disorder reports last drink about 4 months ago. She was admitted with altered mental status after being found in the floor. Inpatient work up thus far shows negative EEG, + GNR currently taking cefepime. During the hospitalization she endorses suicidal ideations to nursing staff therefore psychiatric consult was placed.   Her current presentation of worsening depressive symptoms, hopelessness, worthlessness, guilty, isolation, suicidal thoughts, and anhedonia is most consistent with MDD, severe. She meets criteria for MDD based on the above symptoms that have been ongoing > 2 weeks. She reports her suicidal thoughts started immediately after the passing of her daughter in February in 2024. She is taking outpatient medications that include Gabapentin and Ativan, UDS positive for BZD.  She reports taking an antidepressant, however chart review does not reflect history. On initial examination, patient is alert and oriented x 4, calm and cooperative, attentive, and engages well with this provider. She reports worsening depressive symptoms since the untimely suicide of her daughter. She expresses that her daughter was her only child, and left a grandson age 58 who resides with his father. She reports she is the primary caretaker of her mother age 49, and those are the only reason she has not attempted. She states " I don't know how I am going to go on life without her. " She states her suicidal thoughts were passive until about 3 months ago when she actual developed a plan to jump off a bridge. Prior to her daughters death she  denies any psychiatric history, inpatient psychiatric admissions, outpatient services or psychotropic medications. She denies any grief or counseling services following her death. She denies any history of suicide attempts, non suicidal self injurious behaviors. Please see plan below for detailed recommendations.   12/14: Patient is awake, alert, and oriented. She denies any suicidal ideations for past 24 hours (this conflicts w/ Dr. Tonita Phoenix note). Per nursing staff she denied on their assessments yesterday. No SI, HI, AH/VH today; amenable to restarting prozac. Pt reports thinking she was on it already - discussed she filled it in August and likely ran out mid-Nov (shortly after which she became depressed and suicidal although she has given different providers different timelines on this). Discussed it was likely somewhat effective given timeline. Amenable to starting prozac and remains amenable to inpt psychiatry although pt would like to start safety planning if continued improvement.   12/16: Patient seen and assessed. She greets this provider with a big smile and is very jovial. She inquiries about my weekend, and reports she has been waiting on [facility] to get a bed " something about a leak over there and its pretty bad." She denies any depressive symptoms, and only reports moderate amount of anxiety. She clarifies that her initial suicidal ideations were statements with no intent, and now that she has been here and talking with staff she feels relived and valued. She reports her brother has been helpful in taking care of her mother so that she can work on her mental health. Discussed partial hospitalization with patient in which she happily agreed. She provides appropriate information to include email and phone number.  She is able to contract for safety both at home and in the hospital. She denies any acute psychiatric concerns or suicidal ideations. When assessing for ability to contract for safety she  states" I will scream from the mountain [place] to let someone know." We reviewed warnings for fluoxetine and the importance of notifying a professional if those thoughts return. She denies any hallucinations or homicidal ideations at this time. On exam she is alert and oriented x4, no signs of cognitive impairment present.   We have thoroughly searched for an inpatient geriatric psychiatric bed for the past five days with no success. During this time, the patient's risk factors for suicide attempt or completion have been assessed. However, it is important to note that the patient's protective factors, including strong social support and the absence of acute mental health concerns, significantly outweigh the identified risk factors. The patient has consistently denied suicidal thoughts and ideations for over three days. Furthermore, the patient has no documented history of suicide attempts. Given these circumstances and following a thorough evaluation of modifiable risk factors, the decision was made to offer the patient alternative care options. With the recent modifications in her treatment plan, she is now eligible for a Partial Hospitalization Program (PHP) at our outpatient center. This program is considered the most appropriate level of care to address her ongoing needs, providing intensive therapy and support in a structured, outpatient environment. It is also important to clarify that no geriatric psychiatric inpatient bed was available for the patient, despite efforts to secure one. As such, the plan is for the patient to be discharged home, where she will continue her care with outpatient services, including the PHP. This approach aligns with the patient's current clinical status, and we believe it is the most suitable option at this time.  Diagnoses:  Active Hospital problems: Principal Problem:   Acute encephalopathy Active Problems:   Alcohol dependence (HCC)   Depression, major, recurrent,  moderate (HCC)   Hypothyroid   Hypertension   CKD stage 3a, GFR 45-59 ml/min (HCC)   Hypokalemia   Positive blood culture   Fall     Plan   ## Psychiatric Medication Recommendations:  -- c fluoxetine 20 mg every day (prev effective)   ## Medical Decision Making Capacity:  Not formally assessed but appears to have capacity. There does not appear to be any acute psychiatric condition or encephalopathy that will prohibit decision making at this time.   ## Further Work-up:  --  TSH, B12, folate, EKG, or While pt on Qtc prolonging medications, please monitor & replete K+ to 4 and Mg2+ to 2   -- most recent EKG on 12/8 had QtC of 466 -- Pertinent labwork reviewed earlier this admission includes: Hypokalemia 3.2, TSH 0.031, T3 1.7, Free T4 1.55, Urinalysis Small leukocytes. UDS positive for BZD and THC. May resume Thyroid at this time.   -Recommend PT/OT to increase physical strength and prevent further deconditioning Will defer hyperthyroidism to primary team. Recommend she follow up with outpatient endocrinology.    ## Disposition:  -- Plan Post Discharge/Psychiatric Care Follow-up resources There are no contraindications to discharge at this time. Patient has been scheduled for Partial hospitalization program.     ## Behavioral / Environmental:  --  Utilize compassion and acknowledge the patient's experiences while setting clear and realistic expectations for care.   ## Safety and Observation Level:  - Based on my clinical evaluation, I estimate the patient to be at moderate risk of self harm  in the current setting.  - At this time, we recommend a 1:1 level of observation. This decision is based on my review of the chart including patient's history and current presentation, interview of the patient, mental status examination, and consideration of suicide risk including evaluating suicidal ideation, plan, intent, suicidal or self-harm behaviors, risk factors, and protective factors.  This judgment is based on our ability to directly address suicide risk, implement suicide prevention strategies and develop a safety plan while the patient is in the clinical setting. Please contact our team if there is a concern that risk level has changed.  Suicide risk assessment  Patient has following modifiable risk factors for suicide: untreated depression, social isolation, and lack of access to outpatient mental health resources, which we are addressing by enrolling in outpatient PHP program to receive daily outpatient services. She was started on fluoxetine this admission.   Patient has following non-modifiable or demographic risk factors for suicide: family hx of suicide completion.   Patient has the following protective factors against suicide: Access to outpatient mental health care, Supportive family, Supportive friends, Cultural, spiritual, or religious beliefs that discourage suicide, Frustration tolerance, no history of suicide attempts, and no history of NSSIB Grandson (16), and caretaker for mother.   Thank you for this consult request. Recommendations have been communicated to the primary team.    Maryagnes Amos, FNP  Psychiatric and Social History   Relevant Aspects of Hospital Course:  Admitted on 12/09/2022 for weakness, encephalopathy and falls. They have a history of HTN, HLD, depression, alcohol use disorder reports last drink about 4 months ago. She was admitted with altered mental status after being found in the floor. Inpatient work up thus far shows negative EEG, + GNR currently taking cefepime. During the hospitalization she endorses suicidal ideations to nursing staff therefore psychiatric consult was placed.    Patient Report:  She states she is feeling better than she was. Initially just made a statement to express how she felt, she reports not having any true intent. She continues to deny suicidal ideations today. She is very appropriate and able to have a  linear conversation. She denies any side effects from the Prozac at this time. She denies any increase in energy, mania, impulsivity, or increase in suicidal thoughts She does have a moderate amount of anxiety, but feels more relieved since we are working on disposition. She remained hopeful that [place] would have a bed for her, She reports eating and sleeping without much difficulty.  Denies any SI/HI/AVH.     Psych ROS:  12/16: Feels better and rested. Denies severe depression and anxiety.    Collateral information:  Collateral not obtained at this time. Patient good historian, openly admitting suicidal thoughts with intent and willing to come into the hospital. Will need eventually but will defer at this time.   Psychiatric History:  Information collected from patient and chart reviewed.   Prev Dx/Sx: Major Depressive Disorder diagnosed after daughter completed.  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  Spiritual Hx: non denominational Access to weapons: Denies  Substance History Tobacco use:  Denies Alcohol use: Hx of alcohol use disorder. Tells me her last drink about 4 months ago.  Drug use: Denies, UDS positive for THC and BZD(active rx).    Exam Findings   Psychiatric Specialty Exam:  Presentation  General Appearance: Appropriate for Environment; Casual  Eye Contact:Good  Speech:Normal Rate; Clear and Coherent  Speech Volume:Normal  Handedness:Right   Mood and Affect  Mood:Euthymic  (good)  Affect:Congruent; Appropriate   Thought Process  Thought Processes:Coherent; Linear  Descriptions of Associations:Intact  Orientation:Full (Time, Place and Person)  Thought Content:WDL  Hallucinations:Hallucinations: None    Ideas of Reference:None  Suicidal Thoughts:Suicidal Thoughts: No    Homicidal Thoughts:Homicidal Thoughts: No     Sensorium  Memory:Immediate Fair; Recent Good; Remote Good  Judgment:Good  Insight:Good   Executive Functions  Concentration:Good  Attention Span:Good  Recall:Good  Fund of Knowledge:Good  Language:Good   Psychomotor Activity  Psychomotor Activity:Psychomotor Activity: Normal     Assets  Assets:Communication Skills; Desire for Improvement; Financial Resources/Insurance; Housing   Sleep  Sleep:Sleep: Good      Physical Exam: Vital signs:  Temp:  [97.8 F (36.6 C)-98.5 F (36.9 C)] 98.5 F (36.9 C) (12/16 0629) Pulse Rate:  [57-78] 57 (12/16 0629) Resp:  [14-15] 14 (12/16 0629) BP: (97-114)/(57-70) 114/70 (12/16 0629) SpO2:  [95 %-98 %] 95 % (12/16 6213) Physical Exam Vitals and nursing note reviewed.  Constitutional:      General: She is not in acute distress.    Appearance: Normal appearance. She is not ill-appearing or toxic-appearing.  HENT:     Head: Normocephalic and atraumatic.  Pulmonary:     Effort: Pulmonary effort is normal.  Skin:    Capillary Refill: Capillary refill takes less than 2 seconds.  Neurological:     General: No focal deficit present.     Mental Status: She is alert and oriented to person, place, and time. Mental status is at baseline.  Psychiatric:        Mood and Affect: Mood normal.        Behavior: Behavior normal.        Thought Content: Thought content normal.        Judgment: Judgment normal.     Blood pressure 114/70, pulse (!) 57, temperature 98.5 F (36.9 C), temperature source Oral, resp. rate 14, height 4\' 11"  (1.499 m), weight 57.7 kg, SpO2  95%. Body mass index is 25.69 kg/m.   Other History   These have been pulled in through the EMR, reviewed, and updated if appropriate.   Family History:  Daughter completed in Feb 2024 via intentional drug overdose. Per patient she expressed her intent to die.  The patient's family history is not on file.  Medical History: Past Medical History:  Diagnosis Date   Alcoholism (HCC)    Allergy    Depression    Hyperlipemia    Hypertension    Hypothyroid     Surgical History: Past Surgical History:  Procedure Laterality Date   BREAST EXCISIONAL BIOPSY Left 2008   benign   COLONOSCOPY     fallopian ablation     HAND SURGERY Left 05/2015   MYOMECTOMY     x2   OOPHORECTOMY Right    POLYPECTOMY      Medications:   Current Facility-Administered Medications:    acetaminophen (TYLENOL) tablet 650 mg, 650 mg, Oral, Q6H PRN, 650 mg at 12/16/22 1143 **OR** acetaminophen (TYLENOL) suppository 650 mg, 650 mg, Rectal, Q6H PRN, Opyd, Lavone Neri, MD   amLODipine (NORVASC)  tablet 2.5 mg, 2.5 mg, Oral, Daily, Pokhrel, Laxman, MD, 2.5 mg at 12/17/22 0900   cloNIDine (CATAPRES) tablet 0.1 mg, 0.1 mg, Oral, BID, Pokhrel, Laxman, MD, 0.1 mg at 12/17/22 0901   enoxaparin (LOVENOX) injection 40 mg, 40 mg, Subcutaneous, Q24H, Earl Many M, RPH, 40 mg at 12/16/22 2209   FLUoxetine (PROZAC) capsule 20 mg, 20 mg, Oral, Daily, Cinderella, Damoni A, 20 mg at 12/17/22 0901   fluticasone (FLONASE) 50 MCG/ACT nasal spray 1 spray, 1 spray, Each Nare, Daily, Pokhrel, Laxman, MD, 1 spray at 12/17/22 0901   haloperidol lactate (HALDOL) injection 5 mg, 5 mg, Intravenous, Q6H PRN, Lewie Chamber, MD, 5 mg at 12/11/22 1632   losartan (COZAAR) tablet 100 mg, 100 mg, Oral, Daily, Pokhrel, Laxman, MD, 100 mg at 12/17/22 0900   multivitamins with iron tablet 1 tablet, 1 tablet, Oral, QHS, Mariel Craft, MD, 1 tablet at 12/16/22 2208   ondansetron (ZOFRAN) tablet 4 mg, 4 mg, Oral, Q6H PRN **OR** ondansetron  (ZOFRAN) injection 4 mg, 4 mg, Intravenous, Q6H PRN, Opyd, Lavone Neri, MD   Oral care mouth rinse, 15 mL, Mouth Rinse, PRN, Lewie Chamber, MD   senna-docusate (Senokot-S) tablet 1 tablet, 1 tablet, Oral, QHS PRN, Opyd, Lavone Neri, MD  Allergies: Allergies  Allergen Reactions   Cephalexin Rash   Codeine Anxiety and Other (See Comments)    Hallucinations and jittery  Hallucinations and jittery  Pt states "anxiety and out of body experience"

## 2022-12-17 NOTE — Progress Notes (Signed)
Overnight, patient appeared in good spirits and was very pleasant.  She had no complaints and said she is no longer suicidal. She bathed herself and ate well. Patient slept throughout the night and did not have any issues.Melissa Wolfe

## 2022-12-17 NOTE — Discharge Summary (Addendum)
Physician Discharge Summary   Patient: Melissa Wolfe MRN: [id number] DOB: Aug 21, 1950  Admit date:     12/09/2022  Discharge date: 12/17/22  Discharge Physician: Meredeth Ide   PCP: Creola Corn, MD   Recommendations at discharge:   Check TSH, free T4 at PCP office in 4 weeks Follow-up behavioral health as outpatient Patient refusing to go to skilled nursing facility, wants to go home with home health PT.  Discharge Diagnoses: Principal Problem:   Acute encephalopathy Active Problems:   Depression, major, recurrent, moderate (HCC)   Positive blood culture   Fall   CKD stage 3a, GFR 45-59 ml/min (HCC)   Alcohol dependence (HCC)   Hypothyroid   Hypertension   Hypokalemia  Resolved Problems:   * No resolved hospital problems. [facility] Hospital Course:  Melissa Wolfe is a 72 year old female with past medical history of hypertension, hyperlipidemia, hypothyroidism  depression, alcohol abuse disorder hospital with altered mental status.  Of note patient was recently seen in the ED on 12/04/2022 for left hip pain and was prescribed prednisone.  Since that time she had been having worsening confusion and irritability.  There was some concern regarding patient consuming alcohol as well.  In the ED patient was afebrile without any leukocytosis.  Urinalysis was negative.  Blood cultures were obtained.  Patient was then admitted hospital for further evaluation and treatment.    Assessment and Plan:   Acute encephalopathy -Resolved; likely polypharmacy she was on Ativan and gabapentin -Ativan and gabapentin have been discontinued  Noted to be on the floor on 12/19/2022.  EEG was done without any signs of seizure-like activity.   Fall on the floor. On 12/11/2022 no external  injury.  EEG without signs of seizure.  Positive blood culture Polymicrobial gram-positive species including Staph hominis, capitis, epidermidis from blood culture of 12/09/2022.Marland Kitchen Repeat blood culture on 12/ 9 negative  so far.  She was started on vancomycin and cefepime.  Antibiotics discontinued after discussion with ID.  This is likely contaminant.   Depression, major, recurrent, moderate (HCC) Had some suicidal ideation 12/11/2022 with expression of wanting to jump off the bridge..  Currently on sitter.  Psych consulted, cleared for discharge.  Continue fluoxetine 20 mg p.o. daily Patient to follow-up with psych as outpatient   CKD stage 3a, GFR 45-59 ml/min (HCC) Previous labs from 2016 with baseline creatinine around 1.2 and GFR ~ 40.  Creatinine at this time at 0.9.    Hypokalemia Replete start   Hypertension Continue amlodipine, clonidine, losartan   Hypothyroidism - TSH suppressed, 0.031 with free T4 elevated at 1.5.  Synthroid on hold at this time.   -Patient takes 137 mcg Synthroid at home, we will cut down the dose to 100 mcg p.o. daily.   Alcohol use disorder. Uncertain at this time.             Consultants:  Procedures performed:  Disposition: Home Diet recommendation:  Discharge Diet Orders (From admission, onward)     Start     Ordered   12/17/22 0000  Diet - low sodium heart healthy        12/17/22 1435   12/13/22 0000  Diet general        12/13/22 1105           Regular diet DISCHARGE MEDICATION: Allergies as of 12/17/2022       Reactions   Cephalexin Rash   Codeine Anxiety, Other (See Comments)   Hallucinations and jittery Hallucinations and jittery  Pt states "anxiety and out of body experience"        Medication List     STOP taking these medications    ELDERBERRY PO   gabapentin 300 MG capsule Commonly known as: NEURONTIN   hydrochlorothiazide 25 MG tablet Commonly known as: HYDRODIURIL   JUICE PLUS FIBRE PO   LORazepam 1 MG tablet Commonly known as: ATIVAN   methylPREDNISolone 4 MG Tbpk tablet Commonly known as: MEDROL DOSEPAK   naproxen 500 MG tablet Commonly known as: NAPROSYN   OVER THE COUNTER MEDICATION    oxyCODONE-acetaminophen 5-325 MG tablet Commonly known as: PERCOCET/ROXICET   predniSONE 20 MG tablet Commonly known as: DELTASONE   VITAMIN C PO   ZINC PO       TAKE these medications    acetaminophen 325 MG tablet Commonly known as: TYLENOL Take 2 tablets (650 mg total) by mouth every 6 (six) hours as needed for mild pain (pain score 1-3) (or Fever >/= 101).   amLODipine 2.5 MG tablet Commonly known as: NORVASC Take 2.5 mg by mouth daily.   atorvastatin 20 MG tablet Commonly known as: LIPITOR Take 1 tablet (20 mg total) by mouth daily. For high cholesterol   cloNIDine 0.1 MG tablet Commonly known as: CATAPRES Take 1 tablet (0.1 mg total) by mouth 2 (two) times daily. For high blood pressure   FLUoxetine 20 MG capsule Commonly known as: PROZAC Take 1 capsule (20 mg total) by mouth daily. Start taking on: December 18, 2022   fluticasone 50 MCG/ACT nasal spray Commonly known as: FLONASE Place 1 spray into both nostrils daily.   folic acid 1 MG tablet Commonly known as: FOLVITE Take 1 tablet (1 mg total) by mouth daily.   levothyroxine 100 MCG tablet Commonly known as: Synthroid Take 1 tablet (100 mcg total) by mouth daily. What changed:  medication strength how much to take when to take this additional instructions   lidocaine 5 % Commonly known as: Lidoderm Place 1 patch onto the skin daily. Remove & Discard patch within 12 hours or as directed by MD   losartan 100 MG tablet Commonly known as: COZAAR Take by mouth.   multivitamin tablet Take 1 tablet by mouth daily.   OMEGA 3 PO Take 1,000 mg by mouth daily.   senna-docusate 8.6-50 MG tablet Commonly known as: Senokot-S Take 1 tablet by mouth at bedtime as needed for mild constipation or moderate constipation.   thiamine 100 MG tablet Commonly known as: VITAMIN B1 Take 1 tablet (100 mg total) by mouth daily.        Follow-up Information     BEHAVIORAL HEALTH PARTIAL HOSPITALIZATION  PROGRAM Follow up.   Specialty: Behavioral Health Why: "You are scheduled for an assessment for the PHP on Friday, 12/19 at 10:00 am. This appointment will last approximately one hour and will be virtual via [employer] Please download the Teams app prior to the appointment. You will receive a link via email to join the meeting and will click on the link to connect. If your appointment is scheduled as a MyChart video visit, please do not join that way, as we need to ensure you are able to use Teams for group. If you need to cancel or reschedule, please call [phone] and leave a voicemail with your name, date of birth, and phone number."  Appt scheduled with Milana Na Contact information: [address] [place] [address] [phone]        Creola Corn,  MD Follow up in 4 week(s).   Specialty: Internal Medicine Why: Check TSH, free T4 in 4 weeks at PCP office. Contact information: [address] [place] [address] [phone]                Discharge Exam: Ceasar Mons Weights   12/09/22 2258  Weight: 57.7 kg   General-appears in no acute distress Heart-S1-S2, regular, no murmur auscultated Lungs-clear to auscultation bilaterally, no wheezing or crackles auscultated Abdomen-soft, nontender, no organomegaly Extremities-no edema in the lower extremities Neuro-alert, oriented x3, no focal deficit noted  Throat  Condition at discharge: good  The results of significant diagnostics from this hospitalization (including imaging, microbiology, ancillary and laboratory) are listed below for reference.   Imaging Studies: EEG adult Result Date: 12/11/2022 Charlsie Quest, MD     12/11/2022  5:20 PM Patient Name: REITHA ALECK MRN: [id number] Epilepsy Attending: Charlsie Quest Referring Physician/Provider: Lewie Chamber, MD Date: 12/11/2022 Duration: 21.49 mins Patient history: 72yo F with ams getting eeg to evaluate for seizure Level of  alertness: Awake AEDs during EEG study: None Technical aspects: This EEG study was done with scalp electrodes positioned according to the 10-20 International system of electrode placement. Electrical activity was reviewed with band pass filter of 1-70Hz , sensitivity of 7 uV/mm, display speed of 27mm/sec with a 60Hz  notched filter applied as appropriate. EEG data were recorded continuously and digitally stored.  Video monitoring was available and reviewed as appropriate. Description: The posterior dominant rhythm consists of 8 Hz activity of moderate voltage (25-35 uV) seen predominantly in posterior head regions, symmetric and reactive to eye opening and eye closing. Hyperventilation and photic stimulation were not performed.   Of note, study was technically difficult due to significant electrode and movement artifact IMPRESSION: This technically difficult study is within normal limits. No seizures or epileptiform discharges were seen throughout the recording. A normal interictal EEG does not exclude the diagnosis of epilepsy. Charlsie Quest   CT Head Wo Contrast Result Date: 12/09/2022 CLINICAL DATA:  Get altered mental status. EXAM: CT HEAD WITHOUT CONTRAST TECHNIQUE: Contiguous axial images were obtained from the base of the skull through the vertex without intravenous contrast. RADIATION DOSE REDUCTION: This exam was performed according to the departmental dose-optimization program which includes automated exposure control, adjustment of the mA and/or kV according to patient size and/or use of iterative reconstruction technique. COMPARISON:  05/10/2022. FINDINGS: Brain: No evidence of acute infarction, hemorrhage, hydrocephalus, extra-axial collection or mass lesion/mass effect. Bilateral white matter hypoattenuation consistent with moderate chronic microvascular ischemic change, stable. Vascular: No hyperdense vessel or unexpected calcification. Skull: Normal. Negative for fracture or focal lesion.  Sinuses/Orbits: Globes and orbits are unremarkable. Visualized sinuses are clear. Other: None. IMPRESSION: 1. No acute intracranial abnormalities. 2. Chronic microvascular ischemic changes in the white matter. Electronically Signed   By: Amie Portland M.D.   On: 12/09/2022 15:59   DG Chest 2 View Result Date: 12/09/2022 CLINICAL DATA:  Altered mental status. Increased disorientation for 4 days. EXAM: CHEST - 2 VIEW COMPARISON:  Radiographs 03/01/2014. FINDINGS: The heart size and mediastinal contours are stable. There are calcifications within the aortic arch. The lungs are clear. There is no pleural effusion or pneumothorax. No acute osseous findings are identified. Mild degenerative changes in the spine. Telemetry leads overlie the chest. IMPRESSION: No evidence of acute cardiopulmonary process. Aortic atherosclerosis. Electronically Signed   By: Carey Bullocks M.D.   On: 12/09/2022 14:33    Microbiology: Results for orders placed  or performed during the hospital encounter of 12/09/22  Blood culture (routine x 2)     Status: Abnormal   Collection Time: 12/09/22  2:05 PM   Specimen: BLOOD RIGHT FOREARM  Result Value Ref Range Status   Specimen Description   Final    BLOOD RIGHT FOREARM Performed at [facility], [address] [place], [place] [address]    Special Requests   Final    BOTTLES DRAWN AEROBIC AND ANAEROBIC Blood Culture results may not be optimal due to an inadequate volume of blood received in culture bottles Performed at [facility], [address] [place], [place] [address]    Culture  Setup Time   Final    GRAM POSITIVE COCCI IN BOTH AEROBIC AND ANAEROBIC BOTTLES CRITICAL VALUE NOTED.  VALUE IS CONSISTENT WITH PREVIOUSLY REPORTED AND CALLED VALUE.    Culture (A)  Final    STAPHYLOCOCCUS HOMINIS STAPHYLOCOCCUS CAPITIS STAPHYLOCOCCUS EPIDERMIDIS THE SIGNIFICANCE OF ISOLATING THIS ORGANISM FROM A SINGLE SET OF BLOOD CULTURES WHEN MULTIPLE SETS  ARE DRAWN IS UNCERTAIN. PLEASE NOTIFY THE MICROBIOLOGY DEPARTMENT WITHIN ONE WEEK IF SPECIATION AND SENSITIVITIES ARE REQUIRED. Performed at [facility], [address] [place], [place] [address]    Report Status 12/12/2022 FINAL  Final  Blood culture (routine x 2)     Status: Abnormal   Collection Time: 12/09/22  2:10 PM   Specimen: BLOOD  Result Value Ref Range Status   Specimen Description   Final    BLOOD LEFT ANTECUBITAL Performed at [facility], [address], [place], [place] [address]    Special Requests   Final    BOTTLES DRAWN AEROBIC ONLY Blood Culture results may not be optimal due to an inadequate volume of blood received in culture bottles Performed at [facility], [address] [place], [place] [address]    Culture  Setup Time   Final    Romie Minus NEGATIVE RODS GRAM POSITIVE COCCI AEROBIC BOTTLE ONLY CRITICAL RESULT CALLED TO, READ BACK BY AND VERIFIED WITH: L POINDEXTER,PHARMD@0716  12/10/22 MK    Culture (A)  Final    PANTOEA SPECIES SCHISTOSOMA HAEMATOBIUM THE SIGNIFICANCE OF ISOLATING THIS ORGANISM FROM A SINGLE SET OF BLOOD CULTURES WHEN MULTIPLE SETS ARE DRAWN IS UNCERTAIN. PLEASE NOTIFY THE MICROBIOLOGY DEPARTMENT WITHIN ONE WEEK IF SPECIATION AND SENSITIVITIES ARE REQUIRED. Performed at [facility], [address] [place], [place] [address]    Report Status 12/12/2022 FINAL  Final   Organism ID, Bacteria PANTOEA SPECIES  Final      Susceptibility   Pantoea species - MIC*    CEFEPIME <=0.12 SENSITIVE Sensitive     CEFTAZIDIME <=1 SENSITIVE Sensitive     CEFTRIAXONE <=0.25 SENSITIVE Sensitive     CIPROFLOXACIN <=0.25 SENSITIVE Sensitive     GENTAMICIN <=1 SENSITIVE Sensitive     IMIPENEM <=0.25 SENSITIVE Sensitive     TRIMETH/SULFA <=20 SENSITIVE Sensitive     PIP/TAZO <=4 SENSITIVE Sensitive ug/mL    * PANTOEA SPECIES  Blood Culture ID Panel (Reflexed)     Status: Abnormal   Collection Time: 12/09/22   2:10 PM  Result Value Ref Range Status   Enterococcus faecalis NOT DETECTED NOT DETECTED Final   Enterococcus Faecium NOT DETECTED NOT DETECTED Final   Listeria monocytogenes NOT DETECTED NOT DETECTED Final   Staphylococcus species DETECTED (A) NOT DETECTED Final    Comment: CRITICAL RESULT CALLED TO, READ BACK BY AND VERIFIED WITH: L POINDEXTER,PHARMD@0717  12/10/22  MK    Staphylococcus aureus (BCID) NOT DETECTED NOT DETECTED Final   Staphylococcus epidermidis DETECTED (A) NOT DETECTED Final    Comment: Methicillin (oxacillin) resistant coagulase negative staphylococcus. Possible blood culture contaminant (unless isolated from more than one blood culture draw or clinical case suggests pathogenicity). No antibiotic treatment is indicated for blood  culture contaminants. CRITICAL RESULT CALLED TO, READ BACK BY AND VERIFIED WITH: L POINDEXTER,PHARMD@0717  12/10/22 MK    Staphylococcus lugdunensis DETECTED (A) NOT DETECTED Final    Comment: Methicillin (oxacillin) resistant coagulase negative staphylococcus. Possible blood culture contaminant (unless isolated from more than one blood culture draw or clinical case suggests pathogenicity). No antibiotic treatment is indicated for blood  culture contaminants. CRITICAL RESULT CALLED TO, READ BACK BY AND VERIFIED WITH: L POINDEXTER,PHARMD@0717  12/10/22 MK    Streptococcus species NOT DETECTED NOT DETECTED Final   Streptococcus agalactiae NOT DETECTED NOT DETECTED Final   Streptococcus pneumoniae NOT DETECTED NOT DETECTED Final   Streptococcus pyogenes NOT DETECTED NOT DETECTED Final   A.calcoaceticus-baumannii NOT DETECTED NOT DETECTED Final   Bacteroides fragilis NOT DETECTED NOT DETECTED Final   Enterobacterales DETECTED (A) NOT DETECTED Final    Comment: Enterobacterales represent a large order of gram negative bacteria, not a single organism. Refer to culture for further identification. CRITICAL RESULT CALLED TO, READ BACK BY AND VERIFIED  WITH: L POINDEXTER,PHARMD@0717  12/10/22 MK    Enterobacter cloacae complex NOT DETECTED NOT DETECTED Final   Escherichia coli NOT DETECTED NOT DETECTED Final   Klebsiella aerogenes NOT DETECTED NOT DETECTED Final   Klebsiella oxytoca NOT DETECTED NOT DETECTED Final   Klebsiella pneumoniae NOT DETECTED NOT DETECTED Final   Proteus species NOT DETECTED NOT DETECTED Final   Salmonella species NOT DETECTED NOT DETECTED Final   Serratia marcescens NOT DETECTED NOT DETECTED Final   Haemophilus influenzae NOT DETECTED NOT DETECTED Final   Neisseria meningitidis NOT DETECTED NOT DETECTED Final   Pseudomonas aeruginosa NOT DETECTED NOT DETECTED Final   Stenotrophomonas maltophilia NOT DETECTED NOT DETECTED Final   Candida albicans NOT DETECTED NOT DETECTED Final   Candida auris NOT DETECTED NOT DETECTED Final   Candida glabrata NOT DETECTED NOT DETECTED Final   Candida krusei NOT DETECTED NOT DETECTED Final   Candida parapsilosis NOT DETECTED NOT DETECTED Final   Candida tropicalis NOT DETECTED NOT DETECTED Final   Cryptococcus neoformans/gattii NOT DETECTED NOT DETECTED Final   CTX-M ESBL NOT DETECTED NOT DETECTED Final   Carbapenem resistance IMP NOT DETECTED NOT DETECTED Final   Carbapenem resistance KPC NOT DETECTED NOT DETECTED Final   Methicillin resistance mecA/C DETECTED (A) NOT DETECTED Final    Comment: CRITICAL RESULT CALLED TO, READ BACK BY AND VERIFIED WITH: L POINDEXTER,PHARMD@0717  12/10/22 MK    Carbapenem resistance NDM NOT DETECTED NOT DETECTED Final   Carbapenem resist OXA 48 LIKE NOT DETECTED NOT DETECTED Final   Carbapenem resistance VIM NOT DETECTED NOT DETECTED Final    Comment: Performed at [facility], [address], [place], [place] [address]  Culture, blood (Routine X 2) w Reflex to ID Panel     Status: None   Collection Time: 12/10/22  8:47 AM   Specimen: BLOOD  Result Value Ref Range Status   Specimen Description   Final    BLOOD SITE NOT  SPECIFIED Performed at [facility], [address], [place], [place] [address]    Special Requests   Final    BOTTLES DRAWN AEROBIC AND ANAEROBIC Blood Culture adequate volume Performed at  [facility], [address], [place], [place] [address]    Culture   Final    NO GROWTH 5 DAYS Performed at [facility], [address], [place], [place] [address]    Report Status 12/15/2022 FINAL  Final  Culture, blood (Routine X 2) w Reflex to ID Panel     Status: None   Collection Time: 12/10/22  8:59 AM   Specimen: BLOOD  Result Value Ref Range Status   Specimen Description   Final    BLOOD SITE NOT SPECIFIED Performed at [facility], [address] [place], [place] [address]    Special Requests   Final    BOTTLES DRAWN AEROBIC AND ANAEROBIC Blood Culture adequate volume Performed at [facility], [address] [place], [place] [address]    Culture   Final    NO GROWTH 5 DAYS Performed at [facility], [address], [place], [place] [address]    Report Status 12/15/2022 FINAL  Final  SARS Coronavirus 2 by RT PCR (hospital order, performed in [facility] hospital lab) *cepheid single result test* Anterior Nasal Swab     Status: None   Collection Time: 12/13/22 10:04 AM   Specimen: Anterior Nasal Swab  Result Value Ref Range Status   SARS Coronavirus 2 by RT PCR NEGATIVE NEGATIVE Final    Comment: (NOTE) SARS-CoV-2 target nucleic acids are NOT DETECTED.  The SARS-CoV-2 RNA is generally detectable in upper and lower respiratory specimens during the acute phase of infection. The lowest concentration of SARS-CoV-2 viral copies this assay can detect is 250 copies / mL. A negative result does not preclude SARS-CoV-2 infection and should not be used as the sole basis for treatment or other patient management decisions.  A negative result may occur with improper specimen collection / handling,  submission of specimen other than nasopharyngeal swab, presence of viral mutation(s) within the areas targeted by this assay, and inadequate number of viral copies (<250 copies / mL). A negative result must be combined with clinical observations, patient history, and epidemiological information.  Fact Sheet for Patients:   RoadLapTop.co.za  Fact Sheet for Healthcare Providers: http://kim-miller.com/  This test is not yet approved or  cleared by the [place] FDA and has been authorized for detection and/or diagnosis of SARS-CoV-2 by FDA under an Emergency Use Authorization (EUA).  This EUA will remain in effect (meaning this test can be used) for the duration of the COVID-19 declaration under Section 564(b)(1) of the Act, 21 U.S.C. section 360bbb-3(b)(1), unless the authorization is terminated or revoked sooner.  Performed at [facility], [address], [place], [place] [address]     Labs: CBC: Recent Labs  Lab 12/11/22 0802 12/12/22 0732 12/13/22 0602 12/14/22 0908  WBC 6.7 6.2 7.1 8.1  NEUTROABS 4.0 3.4 4.1 4.8  HGB 12.4 12.1 12.6 13.2  HCT 37.3 35.7* 37.5 39.6  MCV 93.5 92.2 92.6 91.9  PLT 214 218 228 263   Basic Metabolic Panel: Recent Labs  Lab 12/11/22 0802 12/12/22 0732 12/13/22 0602 12/14/22 0908 12/15/22 0800  NA 141 138 138  --  135  K 3.5 3.2* 3.7  --  4.0  CL 109 112* 109  --  109  CO2 22 20* 22  --  22  GLUCOSE 100* 94 109*  --  105*  BUN 37* 25* 18  --  20  CREATININE 1.15* 0.93 0.75  --  0.98  CALCIUM  9.2 8.8* 9.0  --  9.2  MG 2.1 1.9 1.9 1.9 2.0   Liver Function Tests: No results for input(s): "AST", "ALT", "ALKPHOS", "BILITOT", "PROT", "ALBUMIN" in the last 168 hours. CBG: No results for input(s): "GLUCAP" in the last 168 hours.  Discharge time spent: greater than 30 minutes.  Signed: Meredeth Ide, MD [facility] 12/17/2022

## 2022-12-17 NOTE — TOC Transition Note (Addendum)
Transition of Care [facility]) - Discharge Note   Patient Details  Name: Melissa Wolfe MRN: [id number] Date of Birth: 02/13/1950  Transition of [facility] CM/SW Contact:  Beckie Busing, RN Phone Number:[phone]  12/17/2022, 12:14 PM   Clinical Narrative:    Patient is being discharged home with Behavioral health partial hospitalization program. Cm at bedside to review instructions for virtual appointment. Patient verbalized understanding and has been updated on the appointment date. Currently there are no other TOC needs. TOC will sign off.   CM at bedside to offer choice for hh services. Pt has no preference. HH referral accepted by [facility] with Frances Furbish. AVS updated  Final next level of care: Home/Self Care Barriers to Discharge: No Barriers Identified   Patient Goals and CMS Choice Patient states their goals for this hospitalization and ongoing recovery are:: Patient is eager to go home where she can see her family CMS Medicare.gov Compare Post Acute Care list provided to:: Patient Choice offered to / list presented to : NA [place] ownership interest in [facility].provided to:: Patient    Discharge Placement                       Discharge Plan and Services Additional resources added to the After Visit Summary for     Discharge Planning Services: CM Consult            DME Arranged: N/A DME Agency: NA       HH Arranged: NA HH Agency: NA        Social Drivers of Health (SDOH) Interventions SDOH Screenings   Food Insecurity: No Food Insecurity (12/09/2022)  Transportation Needs: No Transportation Needs (12/09/2022)  Utilities: Not At Risk (12/09/2022)  Social Connections: Unknown (11/21/2021)   [facility]  Tobacco Use: High Risk (12/09/2022)     Readmission Risk Interventions    12/17/2022   12:01 PM  Readmission Risk Prevention Plan  Transportation Screening Complete  PCP or Specialist Appt within 5-7 Days  Complete  Home Care Screening Complete  Medication Review (RN CM) Referral to Pharmacy

## 2022-12-17 NOTE — Progress Notes (Addendum)
LCSW Progress Note  [id number]   Melissa Wolfe  12/17/2022  12:16 AM    Inpatient Behavioral Health Placement  Pt meets inpatient criteria per [name].There are no available beds within [facility] system per [facility] Edythe Clarity, RN. Referral was sent to out of network providers.  Destination  Service Provider Address Phone [facility] [address] [place], [place] [address] [phone]  [facility] Habermann [facility] [address] [place] [address] [phone]  [facility] Patient Placement [facility] [place] [phone]  [facility] [address] [place], [place] [address] [phone]  [facility] [address], [place] [address] [phone]  [facility] [address] [place], [place] [address] [phone]  [facility] [address] [place] [address] [phone]  [facility] [address] [place] [address] [phone]  [facility] [address], [place] [address] [phone]  [facility] [address] [place] [address] [phone]  [facility] [address] [place] [address] [phone]  [facility] [address], [place] [address] [phone]  [facility] [place] [phone]  [facility] [address] [place] [address] [phone]  [facility] EFAX [address] Karolee Ohs [place] [phone]  [facility] [address], [place] [address] [phone]  [facility] [address], [place] [address] [phone]  [facility] [address], [place] [address] [phone]  [facility] [address] [place] [address] [phone]  [facility] [address], [place] [address] [phone]  [facility] [address] [place], [place] [address] [phone]  [facility] [address] [place] [address] [phone]  [facility] Inpatient [facility] [place] [phone]  [facility] [address], [place] [address] [phone]  [facility] [address] [place] [address] [phone]  [facility] [address] [place] [address] [phone]  [facility] [address], [place] [address] [phone]  [facility] [address] [place] [address] [phone]    Situation ongoing,  CSW will follow up.    Maryjean Ka, MSW, LCSWA 12/17/2022 12:16 AM

## 2022-12-17 NOTE — Discharge Instructions (Signed)
"  You are scheduled for an assessment for the PHP on Friday, 12/19 at 10:00 am. This appointment will last approximately one hour and will be virtual via [employer] Please download the Teams app prior to the appointment. You will receive a link via email to join the meeting and will click on the link to connect. If your appointment is scheduled as a MyChart video visit, please do not join that way, as we need to ensure you are able to use Teams for group. If you need to cancel or reschedule, please call [phone] and leave a voicemail with your name, date of birth, and phone number."

## 2022-12-20 DIAGNOSIS — M4807 Spinal stenosis, lumbosacral region: Secondary | ICD-10-CM | POA: Diagnosis not present

## 2022-12-20 DIAGNOSIS — M5124 Other intervertebral disc displacement, thoracic region: Secondary | ICD-10-CM | POA: Diagnosis not present

## 2022-12-20 DIAGNOSIS — M5116 Intervertebral disc disorders with radiculopathy, lumbar region: Secondary | ICD-10-CM | POA: Diagnosis not present

## 2022-12-20 DIAGNOSIS — M48062 Spinal stenosis, lumbar region with neurogenic claudication: Secondary | ICD-10-CM | POA: Diagnosis not present

## 2022-12-20 DIAGNOSIS — M4804 Spinal stenosis, thoracic region: Secondary | ICD-10-CM | POA: Diagnosis not present

## 2022-12-20 DIAGNOSIS — M47812 Spondylosis without myelopathy or radiculopathy, cervical region: Secondary | ICD-10-CM | POA: Diagnosis not present

## 2022-12-20 DIAGNOSIS — M4316 Spondylolisthesis, lumbar region: Secondary | ICD-10-CM | POA: Diagnosis not present

## 2022-12-20 DIAGNOSIS — M4726 Other spondylosis with radiculopathy, lumbar region: Secondary | ICD-10-CM | POA: Diagnosis not present

## 2022-12-20 DIAGNOSIS — M47817 Spondylosis without myelopathy or radiculopathy, lumbosacral region: Secondary | ICD-10-CM | POA: Diagnosis not present

## 2022-12-21 ENCOUNTER — Ambulatory Visit (HOSPITAL_COMMUNITY): Payer: Self-pay

## 2022-12-21 ENCOUNTER — Telehealth (HOSPITAL_COMMUNITY): Payer: Self-pay | Admitting: Licensed Clinical Social Worker

## 2022-12-21 NOTE — Telephone Encounter (Signed)
Cln called due to pt not being signed onto [employer] Teams for PHP CCA. Cln explained that a link was emailed to pt this morning and pt insisted that the link was sent earlier in the week and stated it "disappeared" when she clicked on it. Cln informed pt that the link for this appointment was sent this morning and offered to re-send it. Pt continued to insist this was not the case. Cln stated it may have been for a different appointment and attempted to redirect pt to the current appointment, but pt interrupted and continued to assert that it was sent earlier in the week. Cln redirected pt to today's appointment and as cln began to orient pt to PHP, pt interrupted and asked "What is it?" Cln began to orient pt to [facility] and pt interrupted and said, "But you won't tell me what it is." Cln informed pt that she is repeatedly interrupting and asked pt to allow cln to finish speaking. Cln oriented pt to PHP and asked if she wanted to proceed with the CCA. Pt declined. Cln asked if pt has other outpatient follow-ups and pt stated she does not need referrals.

## 2022-12-24 DIAGNOSIS — M4726 Other spondylosis with radiculopathy, lumbar region: Secondary | ICD-10-CM | POA: Diagnosis not present

## 2022-12-24 DIAGNOSIS — M48062 Spinal stenosis, lumbar region with neurogenic claudication: Secondary | ICD-10-CM | POA: Diagnosis not present

## 2022-12-24 DIAGNOSIS — M5116 Intervertebral disc disorders with radiculopathy, lumbar region: Secondary | ICD-10-CM | POA: Diagnosis not present

## 2022-12-24 DIAGNOSIS — M4316 Spondylolisthesis, lumbar region: Secondary | ICD-10-CM | POA: Diagnosis not present

## 2022-12-24 DIAGNOSIS — M5124 Other intervertebral disc displacement, thoracic region: Secondary | ICD-10-CM | POA: Diagnosis not present

## 2022-12-24 DIAGNOSIS — M4807 Spinal stenosis, lumbosacral region: Secondary | ICD-10-CM | POA: Diagnosis not present

## 2022-12-24 DIAGNOSIS — M4804 Spinal stenosis, thoracic region: Secondary | ICD-10-CM | POA: Diagnosis not present

## 2022-12-24 DIAGNOSIS — M47812 Spondylosis without myelopathy or radiculopathy, cervical region: Secondary | ICD-10-CM | POA: Diagnosis not present

## 2022-12-24 DIAGNOSIS — M47817 Spondylosis without myelopathy or radiculopathy, lumbosacral region: Secondary | ICD-10-CM | POA: Diagnosis not present

## 2022-12-28 DIAGNOSIS — G9340 Encephalopathy, unspecified: Secondary | ICD-10-CM | POA: Diagnosis not present

## 2022-12-28 DIAGNOSIS — E039 Hypothyroidism, unspecified: Secondary | ICD-10-CM | POA: Diagnosis not present

## 2022-12-28 DIAGNOSIS — F329 Major depressive disorder, single episode, unspecified: Secondary | ICD-10-CM | POA: Diagnosis not present

## 2022-12-28 DIAGNOSIS — M5416 Radiculopathy, lumbar region: Secondary | ICD-10-CM | POA: Diagnosis not present

## 2022-12-28 DIAGNOSIS — I10 Essential (primary) hypertension: Secondary | ICD-10-CM | POA: Diagnosis not present

## 2022-12-28 DIAGNOSIS — R634 Abnormal weight loss: Secondary | ICD-10-CM | POA: Diagnosis not present

## 2022-12-28 DIAGNOSIS — R296 Repeated falls: Secondary | ICD-10-CM | POA: Diagnosis not present

## 2022-12-28 DIAGNOSIS — F1020 Alcohol dependence, uncomplicated: Secondary | ICD-10-CM | POA: Diagnosis not present

## 2022-12-28 DIAGNOSIS — E785 Hyperlipidemia, unspecified: Secondary | ICD-10-CM | POA: Diagnosis not present

## 2023-01-01 ENCOUNTER — Telehealth (HOSPITAL_COMMUNITY): Payer: Self-pay | Admitting: Licensed Clinical Social Worker

## 2023-01-07 ENCOUNTER — Telehealth (HOSPITAL_COMMUNITY): Payer: Self-pay | Admitting: Professional

## 2023-01-07 ENCOUNTER — Other Ambulatory Visit (HOSPITAL_COMMUNITY): Payer: Medicare Other | Attending: Psychiatry

## 2023-01-07 ENCOUNTER — Encounter (HOSPITAL_COMMUNITY): Payer: Self-pay

## 2023-01-07 DIAGNOSIS — M5116 Intervertebral disc disorders with radiculopathy, lumbar region: Secondary | ICD-10-CM | POA: Diagnosis not present

## 2023-01-07 DIAGNOSIS — M48062 Spinal stenosis, lumbar region with neurogenic claudication: Secondary | ICD-10-CM | POA: Diagnosis not present

## 2023-01-07 DIAGNOSIS — M5124 Other intervertebral disc displacement, thoracic region: Secondary | ICD-10-CM | POA: Diagnosis not present

## 2023-01-07 DIAGNOSIS — M4807 Spinal stenosis, lumbosacral region: Secondary | ICD-10-CM | POA: Diagnosis not present

## 2023-01-07 DIAGNOSIS — M4726 Other spondylosis with radiculopathy, lumbar region: Secondary | ICD-10-CM | POA: Diagnosis not present

## 2023-01-07 DIAGNOSIS — M47817 Spondylosis without myelopathy or radiculopathy, lumbosacral region: Secondary | ICD-10-CM | POA: Diagnosis not present

## 2023-01-07 DIAGNOSIS — M4804 Spinal stenosis, thoracic region: Secondary | ICD-10-CM | POA: Diagnosis not present

## 2023-01-07 DIAGNOSIS — M47812 Spondylosis without myelopathy or radiculopathy, cervical region: Secondary | ICD-10-CM | POA: Diagnosis not present

## 2023-01-07 DIAGNOSIS — M4316 Spondylolisthesis, lumbar region: Secondary | ICD-10-CM | POA: Diagnosis not present

## 2023-01-10 DIAGNOSIS — M5116 Intervertebral disc disorders with radiculopathy, lumbar region: Secondary | ICD-10-CM | POA: Diagnosis not present

## 2023-01-10 DIAGNOSIS — M4316 Spondylolisthesis, lumbar region: Secondary | ICD-10-CM | POA: Diagnosis not present

## 2023-01-10 DIAGNOSIS — M47812 Spondylosis without myelopathy or radiculopathy, cervical region: Secondary | ICD-10-CM | POA: Diagnosis not present

## 2023-01-10 DIAGNOSIS — M4807 Spinal stenosis, lumbosacral region: Secondary | ICD-10-CM | POA: Diagnosis not present

## 2023-01-10 DIAGNOSIS — M4804 Spinal stenosis, thoracic region: Secondary | ICD-10-CM | POA: Diagnosis not present

## 2023-01-10 DIAGNOSIS — M4726 Other spondylosis with radiculopathy, lumbar region: Secondary | ICD-10-CM | POA: Diagnosis not present

## 2023-01-10 DIAGNOSIS — M5124 Other intervertebral disc displacement, thoracic region: Secondary | ICD-10-CM | POA: Diagnosis not present

## 2023-01-10 DIAGNOSIS — M48062 Spinal stenosis, lumbar region with neurogenic claudication: Secondary | ICD-10-CM | POA: Diagnosis not present

## 2023-01-10 DIAGNOSIS — M47817 Spondylosis without myelopathy or radiculopathy, lumbosacral region: Secondary | ICD-10-CM | POA: Diagnosis not present

## 2023-01-17 DIAGNOSIS — M4316 Spondylolisthesis, lumbar region: Secondary | ICD-10-CM | POA: Diagnosis not present

## 2023-01-17 DIAGNOSIS — M47817 Spondylosis without myelopathy or radiculopathy, lumbosacral region: Secondary | ICD-10-CM | POA: Diagnosis not present

## 2023-01-17 DIAGNOSIS — M4807 Spinal stenosis, lumbosacral region: Secondary | ICD-10-CM | POA: Diagnosis not present

## 2023-01-17 DIAGNOSIS — M47812 Spondylosis without myelopathy or radiculopathy, cervical region: Secondary | ICD-10-CM | POA: Diagnosis not present

## 2023-01-17 DIAGNOSIS — M48062 Spinal stenosis, lumbar region with neurogenic claudication: Secondary | ICD-10-CM | POA: Diagnosis not present

## 2023-01-17 DIAGNOSIS — M5116 Intervertebral disc disorders with radiculopathy, lumbar region: Secondary | ICD-10-CM | POA: Diagnosis not present

## 2023-01-17 DIAGNOSIS — M4804 Spinal stenosis, thoracic region: Secondary | ICD-10-CM | POA: Diagnosis not present

## 2023-01-17 DIAGNOSIS — M4726 Other spondylosis with radiculopathy, lumbar region: Secondary | ICD-10-CM | POA: Diagnosis not present

## 2023-01-17 DIAGNOSIS — M5124 Other intervertebral disc displacement, thoracic region: Secondary | ICD-10-CM | POA: Diagnosis not present

## 2023-02-18 DIAGNOSIS — N3000 Acute cystitis without hematuria: Secondary | ICD-10-CM

## 2023-02-18 DIAGNOSIS — G9341 Metabolic encephalopathy: Secondary | ICD-10-CM
